# Patient Record
Sex: Female | Born: 1945 | Race: White | Hispanic: No | Marital: Single | Smoking: Former smoker
Health system: Southern US, Community
[De-identification: ages and names within clinical notes are randomized; demographics above are authoritative.]

## PROBLEM LIST (undated history)

## (undated) DIAGNOSIS — K649 Unspecified hemorrhoids: Secondary | ICD-10-CM

## (undated) DIAGNOSIS — I10 Essential (primary) hypertension: Secondary | ICD-10-CM

## (undated) DIAGNOSIS — I809 Phlebitis and thrombophlebitis of unspecified site: Secondary | ICD-10-CM

## (undated) DIAGNOSIS — E042 Nontoxic multinodular goiter: Secondary | ICD-10-CM

## (undated) DIAGNOSIS — M1990 Unspecified osteoarthritis, unspecified site: Secondary | ICD-10-CM

## (undated) DIAGNOSIS — M5030 Other cervical disc degeneration, unspecified cervical region: Secondary | ICD-10-CM

## (undated) DIAGNOSIS — H3320 Serous retinal detachment, unspecified eye: Secondary | ICD-10-CM

## (undated) DIAGNOSIS — G4700 Insomnia, unspecified: Secondary | ICD-10-CM

## (undated) HISTORY — DX: Unspecified osteoarthritis, unspecified site: M19.90

## (undated) HISTORY — PX: ABDOMINAL HYSTERECTOMY: SHX81

## (undated) HISTORY — DX: Insomnia, unspecified: G47.00

## (undated) HISTORY — PX: EYE SURGERY: SHX253

## (undated) HISTORY — PX: SPINE SURGERY: SHX786

## (undated) HISTORY — DX: Essential (primary) hypertension: I10

## (undated) HISTORY — DX: Phlebitis and thrombophlebitis of unspecified site: I80.9

## (undated) HISTORY — DX: Unspecified hemorrhoids: K64.9

## (undated) HISTORY — DX: Nontoxic multinodular goiter: E04.2

## (undated) HISTORY — DX: Other cervical disc degeneration, unspecified cervical region: M50.30

## (undated) HISTORY — PX: COLONOSCOPY: SHX174

## (undated) HISTORY — PX: TUBAL LIGATION: SHX77

---

## 2005-09-04 LAB — HM COLONOSCOPY: HM Colonoscopy: NORMAL

## 2007-01-30 ENCOUNTER — Encounter: Admission: RE | Admit: 2007-01-30 | Discharge: 2007-01-30 | Payer: Self-pay | Admitting: Family Medicine

## 2007-02-01 ENCOUNTER — Encounter: Admission: RE | Admit: 2007-02-01 | Discharge: 2007-02-01 | Payer: Self-pay | Admitting: Family Medicine

## 2008-02-03 ENCOUNTER — Encounter: Admission: RE | Admit: 2008-02-03 | Discharge: 2008-02-03 | Payer: Self-pay | Admitting: Family Medicine

## 2009-04-15 ENCOUNTER — Encounter: Admission: RE | Admit: 2009-04-15 | Discharge: 2009-04-15 | Payer: Self-pay | Admitting: Family Medicine

## 2009-07-09 LAB — HM PAP SMEAR: HM Pap smear: NORMAL

## 2010-03-12 ENCOUNTER — Other Ambulatory Visit: Payer: Self-pay | Admitting: Family Medicine

## 2010-03-12 DIAGNOSIS — Z1239 Encounter for other screening for malignant neoplasm of breast: Secondary | ICD-10-CM

## 2010-03-12 DIAGNOSIS — Z780 Asymptomatic menopausal state: Secondary | ICD-10-CM

## 2010-04-18 ENCOUNTER — Ambulatory Visit
Admission: RE | Admit: 2010-04-18 | Discharge: 2010-04-18 | Disposition: A | Discharge status: Home / Self Care | Payer: Private Health Insurance - Indemnity | Source: Ambulatory Visit | Attending: Family Medicine | Admitting: Family Medicine

## 2010-04-18 DIAGNOSIS — Z1239 Encounter for other screening for malignant neoplasm of breast: Secondary | ICD-10-CM

## 2010-04-18 DIAGNOSIS — Z780 Asymptomatic menopausal state: Secondary | ICD-10-CM

## 2010-04-18 LAB — HM DEXA SCAN

## 2010-04-21 ENCOUNTER — Other Ambulatory Visit: Payer: Self-pay | Admitting: Family Medicine

## 2010-04-21 DIAGNOSIS — R928 Other abnormal and inconclusive findings on diagnostic imaging of breast: Secondary | ICD-10-CM

## 2010-04-27 ENCOUNTER — Other Ambulatory Visit: Payer: Private Health Insurance - Indemnity

## 2010-04-28 ENCOUNTER — Ambulatory Visit
Admission: RE | Admit: 2010-04-28 | Discharge: 2010-04-28 | Disposition: A | Discharge status: Home / Self Care | Payer: Medicare Other | Source: Ambulatory Visit | Attending: Family Medicine | Admitting: Family Medicine

## 2010-04-28 DIAGNOSIS — R928 Other abnormal and inconclusive findings on diagnostic imaging of breast: Secondary | ICD-10-CM

## 2010-12-14 ENCOUNTER — Encounter: Payer: Self-pay | Admitting: Gastroenterology

## 2011-01-16 ENCOUNTER — Other Ambulatory Visit: Payer: Private Health Insurance - Indemnity | Admitting: Gastroenterology

## 2011-01-27 ENCOUNTER — Telehealth: Payer: Self-pay | Admitting: *Deleted

## 2011-01-27 NOTE — Telephone Encounter (Signed)
Dr. Arlyce Dice; pt here for [facility] today for screening colonoscopy.  Last colonoscopy in [place] 5 years ago.  She does not remember MD name or even where she had it performed so we have no way of getting reports.  Pt says colonoscopy was normal with no polyps.  No family history of colon cancer. She is not having problems. She made this appointment for colonoscopy because she thought she should have a colonoscopy every 5 years.   I told her that I would talk to you but I thought she would be due for colonoscopy 10 years from last colonoscopy.  If you agree, I will enter recall for 2017.  Thanks, Ezra Sites

## 2011-01-30 NOTE — Telephone Encounter (Signed)
Recall for 2017 entered into computer. Audrey David

## 2011-01-30 NOTE — Telephone Encounter (Signed)
I agree. She does not need another exam for 5 years.

## 2011-02-06 ENCOUNTER — Other Ambulatory Visit: Payer: Private Health Insurance - Indemnity | Admitting: Gastroenterology

## 2011-05-12 ENCOUNTER — Other Ambulatory Visit: Payer: Self-pay | Admitting: Family Medicine

## 2011-05-12 DIAGNOSIS — Z1231 Encounter for screening mammogram for malignant neoplasm of breast: Secondary | ICD-10-CM

## 2011-05-30 ENCOUNTER — Ambulatory Visit (HOSPITAL_COMMUNITY)
Admission: RE | Admit: 2011-05-30 | Discharge: 2011-05-30 | Disposition: A | Discharge status: Home / Self Care | Payer: Medicare Other | Source: Ambulatory Visit | Attending: Chiropractic Medicine | Admitting: Chiropractic Medicine

## 2011-05-30 ENCOUNTER — Other Ambulatory Visit (HOSPITAL_COMMUNITY): Payer: Self-pay | Admitting: Chiropractic Medicine

## 2011-05-30 DIAGNOSIS — M545 Low back pain, unspecified: Secondary | ICD-10-CM | POA: Insufficient documentation

## 2011-05-30 DIAGNOSIS — R52 Pain, unspecified: Secondary | ICD-10-CM

## 2011-05-30 DIAGNOSIS — M4310 Spondylolisthesis, site unspecified: Secondary | ICD-10-CM | POA: Diagnosis not present

## 2011-05-30 DIAGNOSIS — M542 Cervicalgia: Secondary | ICD-10-CM | POA: Diagnosis not present

## 2011-05-30 DIAGNOSIS — M47817 Spondylosis without myelopathy or radiculopathy, lumbosacral region: Secondary | ICD-10-CM | POA: Insufficient documentation

## 2011-05-30 DIAGNOSIS — M5137 Other intervertebral disc degeneration, lumbosacral region: Secondary | ICD-10-CM | POA: Insufficient documentation

## 2011-05-30 DIAGNOSIS — M9981 Other biomechanical lesions of cervical region: Secondary | ICD-10-CM | POA: Diagnosis not present

## 2011-05-30 DIAGNOSIS — M5030 Other cervical disc degeneration, unspecified cervical region: Secondary | ICD-10-CM | POA: Diagnosis not present

## 2011-05-30 DIAGNOSIS — M79609 Pain in unspecified limb: Secondary | ICD-10-CM | POA: Diagnosis not present

## 2011-05-30 DIAGNOSIS — M51379 Other intervertebral disc degeneration, lumbosacral region without mention of lumbar back pain or lower extremity pain: Secondary | ICD-10-CM | POA: Diagnosis not present

## 2011-05-30 DIAGNOSIS — M47812 Spondylosis without myelopathy or radiculopathy, cervical region: Secondary | ICD-10-CM | POA: Insufficient documentation

## 2011-05-30 DIAGNOSIS — M999 Biomechanical lesion, unspecified: Secondary | ICD-10-CM | POA: Diagnosis not present

## 2011-06-01 DIAGNOSIS — R7989 Other specified abnormal findings of blood chemistry: Secondary | ICD-10-CM | POA: Diagnosis not present

## 2011-06-01 DIAGNOSIS — M9981 Other biomechanical lesions of cervical region: Secondary | ICD-10-CM | POA: Diagnosis not present

## 2011-06-01 DIAGNOSIS — M4310 Spondylolisthesis, site unspecified: Secondary | ICD-10-CM | POA: Diagnosis not present

## 2011-06-01 DIAGNOSIS — Z0000 Encounter for general adult medical examination without abnormal findings: Secondary | ICD-10-CM | POA: Diagnosis not present

## 2011-06-01 DIAGNOSIS — M818 Other osteoporosis without current pathological fracture: Secondary | ICD-10-CM | POA: Diagnosis not present

## 2011-06-01 DIAGNOSIS — M545 Low back pain, unspecified: Secondary | ICD-10-CM | POA: Diagnosis not present

## 2011-06-01 DIAGNOSIS — E785 Hyperlipidemia, unspecified: Secondary | ICD-10-CM | POA: Diagnosis not present

## 2011-06-01 DIAGNOSIS — M999 Biomechanical lesion, unspecified: Secondary | ICD-10-CM | POA: Diagnosis not present

## 2011-06-05 DIAGNOSIS — R55 Syncope and collapse: Secondary | ICD-10-CM | POA: Diagnosis not present

## 2011-06-05 DIAGNOSIS — M545 Low back pain, unspecified: Secondary | ICD-10-CM | POA: Diagnosis not present

## 2011-06-05 DIAGNOSIS — M999 Biomechanical lesion, unspecified: Secondary | ICD-10-CM | POA: Diagnosis not present

## 2011-06-05 DIAGNOSIS — M9981 Other biomechanical lesions of cervical region: Secondary | ICD-10-CM | POA: Diagnosis not present

## 2011-06-05 DIAGNOSIS — R7309 Other abnormal glucose: Secondary | ICD-10-CM | POA: Diagnosis not present

## 2011-06-05 DIAGNOSIS — M4310 Spondylolisthesis, site unspecified: Secondary | ICD-10-CM | POA: Diagnosis not present

## 2011-06-09 ENCOUNTER — Ambulatory Visit
Admission: RE | Admit: 2011-06-09 | Discharge: 2011-06-09 | Disposition: A | Discharge status: Home / Self Care | Payer: Medicare Other | Source: Ambulatory Visit | Attending: Family Medicine | Admitting: Family Medicine

## 2011-06-09 DIAGNOSIS — Z1231 Encounter for screening mammogram for malignant neoplasm of breast: Secondary | ICD-10-CM | POA: Diagnosis not present

## 2011-07-06 ENCOUNTER — Encounter: Payer: Self-pay | Admitting: Gastroenterology

## 2011-07-07 DIAGNOSIS — R42 Dizziness and giddiness: Secondary | ICD-10-CM | POA: Diagnosis not present

## 2011-07-07 DIAGNOSIS — E7800 Pure hypercholesterolemia, unspecified: Secondary | ICD-10-CM | POA: Diagnosis not present

## 2011-07-07 DIAGNOSIS — R9431 Abnormal electrocardiogram [ECG] [EKG]: Secondary | ICD-10-CM | POA: Diagnosis not present

## 2011-07-07 DIAGNOSIS — R030 Elevated blood-pressure reading, without diagnosis of hypertension: Secondary | ICD-10-CM | POA: Diagnosis not present

## 2011-07-10 DIAGNOSIS — R222 Localized swelling, mass and lump, trunk: Secondary | ICD-10-CM | POA: Diagnosis not present

## 2011-07-10 DIAGNOSIS — K6289 Other specified diseases of anus and rectum: Secondary | ICD-10-CM | POA: Diagnosis not present

## 2011-08-08 ENCOUNTER — Encounter: Payer: Self-pay | Admitting: Gastroenterology

## 2011-08-08 ENCOUNTER — Ambulatory Visit (INDEPENDENT_AMBULATORY_CARE_PROVIDER_SITE_OTHER): Payer: Medicare Other | Admitting: Gastroenterology

## 2011-08-08 VITALS — BP 132/60 | HR 88 | Ht 65.0 in | Wt 149.0 lb

## 2011-08-08 DIAGNOSIS — Z1211 Encounter for screening for malignant neoplasm of colon: Secondary | ICD-10-CM | POA: Insufficient documentation

## 2011-08-08 DIAGNOSIS — K648 Other hemorrhoids: Secondary | ICD-10-CM | POA: Diagnosis not present

## 2011-08-08 NOTE — Assessment & Plan Note (Signed)
Last colonoscopy approximately 2008. Plan followup colonoscopy 2018

## 2011-08-08 NOTE — Progress Notes (Signed)
History of Present Illness: [name] is a 66 year old white female referred at the request of Dr. Tanya Nones her evaluation of a rectal abnormality. While washing herself she felt a pea-sized lump in her rectum. It is painless. She's had no change in bowel habits, rectal pain or bleeding. Colonoscopy 5 years ago in [place] apparently was normal.    Past Medical History  Diagnosis Date  . Phlebitis   . Arthritis   . Hemorrhoids    Past Surgical History  Procedure Date  . Abdominal hysterectomy    family history is not on file. Current Outpatient Prescriptions  Medication Sig Dispense Refill  . meloxicam (MOBIC) 15 MG tablet Use as directed      . zolpidem (AMBIEN) 10 MG tablet Use as bedtime as directed       Allergies as of 08/08/2011  . (No Known Allergies)    reports that she has quit smoking. She has never used smokeless tobacco. She reports that she drinks alcohol. She reports that she does not use illicit drugs.     Review of Systems: Pertinent positive and negative review of systems were noted in the above HPI section. All other review of systems were otherwise negative.  Vital signs were reviewed in today's medical record Physical Exam: General: Well developed , well nourished, no acute distress Head: Normocephalic and atraumatic Eyes:  sclerae anicteric, EOMI Ears: Normal auditory acuity Mouth: No deformity or lesions Neck: Supple, no masses or thyromegaly Lungs: Clear throughout to auscultation Heart: Regular rate and rhythm; no murmurs, rubs or bruits Abdomen: Soft, non tender and non distended. No masses, hepatosplenomegaly or hernias noted. Normal Bowel sounds Rectal: There are no rectal masses. A small external hemorrhoid is present. Musculoskeletal: Symmetrical with no gross deformities  Skin: No lesions on visible extremities Pulses:  Normal pulses noted Extremities: No clubbing, cyanosis, edema or deformities noted Neurological: Alert oriented x 4,  grossly nonfocal Cervical Nodes:  No significant cervical adenopathy Inguinal Nodes: No significant inguinal adenopathy Psychological:  Alert and cooperative. Normal mood and affect  Anoscopy was performed. Internal hemorrhoids were noted. On the left side an internal hemorrhoid corresponded to what the patient was palpating.

## 2011-08-08 NOTE — Assessment & Plan Note (Signed)
The patient is palpating a small internal hemorrhoid. She is asymptomatic. Accordingly, I don't think therapy is required.

## 2011-08-08 NOTE — Patient Instructions (Addendum)
Follow up with Dr. Arlyce Dice as needed   Hemorrhoids Hemorrhoids are enlarged (dilated) veins around the rectum. There are 2 types of hemorrhoids, and the type of hemorrhoid is determined by its location. Internal hemorrhoids occur in the veins just inside the rectum.They are usually not painful, but they may bleed.However, they may poke through to the outside and become irritated and painful. External hemorrhoids involve the veins outside the anus and can be felt as a painful swelling or hard lump near the anus.They are often itchy and may crack and bleed. Sometimes clots will form in the veins. This makes them swollen and painful. These are called thrombosed hemorrhoids. CAUSES Causes of hemorrhoids include:  Pregnancy. This increases the pressure in the hemorrhoidal veins.   Constipation.   Straining to have a bowel movement.   Obesity.   Heavy lifting or other activity that caused you to strain.  TREATMENT Most of the time hemorrhoids improve in 1 to 2 weeks. However, if symptoms do not seem to be getting better or if you have a lot of rectal bleeding, your caregiver may perform a procedure to help make the hemorrhoids get smaller or remove them completely.Possible treatments include:  Rubber band ligation. A rubber band is placed at the base of the hemorrhoid to cut off the circulation.   Sclerotherapy. A chemical is injected to shrink the hemorrhoid.   Infrared light therapy. Tools are used to burn the hemorrhoid.   Hemorrhoidectomy. This is surgical removal of the hemorrhoid.  HOME CARE INSTRUCTIONS   Increase fiber in your diet. Ask your caregiver about using fiber supplements.   Drink enough water and fluids to keep your urine clear or pale yellow.   Exercise regularly.   Go to the bathroom when you have the urge to have a bowel movement. Do not wait.   Avoid straining to have bowel movements.   Keep the anal area dry and clean.   Only take over-the-counter or  prescription medicines for pain, discomfort, or fever as directed by your caregiver.  If your hemorrhoids are thrombosed:  Take warm sitz baths for 20 to 30 minutes, 3 to 4 times per day.   If the hemorrhoids are very tender and swollen, place ice packs on the area as tolerated. Using ice packs between sitz baths may be helpful. Fill a plastic bag with ice. Place a towel between the bag of ice and your skin.   Medicated creams and suppositories may be used or applied as directed.   Do not use a donut-shaped pillow or sit on the toilet for long periods. This increases blood pooling and pain.  SEEK MEDICAL CARE IF:   You have increasing pain and swelling that is not controlled with your medicine.   You have uncontrolled bleeding.   You have difficulty or you are unable to have a bowel movement.   You have pain or inflammation outside the area of the hemorrhoids.   You have chills or an oral temperature above 102 F (38.9 C).  MAKE SURE YOU:   Understand these instructions.   Will watch your condition.   Will get help right away if you are not doing well or get worse.  Document Released: 02/04/2000 Document Revised: 01/26/2011 Document Reviewed: 06/11/2007 [facility] Patient Information 2012 [place], [place].

## 2011-08-10 DIAGNOSIS — R9431 Abnormal electrocardiogram [ECG] [EKG]: Secondary | ICD-10-CM | POA: Diagnosis not present

## 2011-11-02 DIAGNOSIS — S30860A Insect bite (nonvenomous) of lower back and pelvis, initial encounter: Secondary | ICD-10-CM | POA: Diagnosis not present

## 2011-11-02 DIAGNOSIS — L0390 Cellulitis, unspecified: Secondary | ICD-10-CM | POA: Diagnosis not present

## 2011-11-02 DIAGNOSIS — L0291 Cutaneous abscess, unspecified: Secondary | ICD-10-CM | POA: Diagnosis not present

## 2011-11-02 DIAGNOSIS — T7840XA Allergy, unspecified, initial encounter: Secondary | ICD-10-CM | POA: Diagnosis not present

## 2011-11-30 DIAGNOSIS — I10 Essential (primary) hypertension: Secondary | ICD-10-CM | POA: Diagnosis not present

## 2012-01-24 DIAGNOSIS — E119 Type 2 diabetes mellitus without complications: Secondary | ICD-10-CM | POA: Diagnosis not present

## 2012-01-24 DIAGNOSIS — H2510 Age-related nuclear cataract, unspecified eye: Secondary | ICD-10-CM | POA: Diagnosis not present

## 2012-04-30 ENCOUNTER — Other Ambulatory Visit: Payer: Self-pay

## 2012-04-30 DIAGNOSIS — Z1231 Encounter for screening mammogram for malignant neoplasm of breast: Secondary | ICD-10-CM

## 2012-07-03 ENCOUNTER — Ambulatory Visit
Admission: RE | Admit: 2012-07-03 | Discharge: 2012-07-03 | Disposition: A | Discharge status: Home / Self Care | Payer: Medicare Other | Source: Ambulatory Visit

## 2012-07-03 DIAGNOSIS — Z1231 Encounter for screening mammogram for malignant neoplasm of breast: Secondary | ICD-10-CM | POA: Diagnosis not present

## 2012-07-20 ENCOUNTER — Other Ambulatory Visit: Payer: Self-pay | Admitting: Physician Assistant

## 2012-07-22 NOTE — Telephone Encounter (Signed)
Medication refilled per protocol.Patient needs to be seen before any further refills  Pt called and appt made

## 2012-08-08 ENCOUNTER — Ambulatory Visit (INDEPENDENT_AMBULATORY_CARE_PROVIDER_SITE_OTHER): Payer: Medicare Other | Admitting: Physician Assistant

## 2012-08-08 ENCOUNTER — Encounter: Payer: Self-pay | Admitting: Physician Assistant

## 2012-08-08 VITALS — BP 104/70 | HR 68 | Temp 98.4°F | Resp 20 | Ht 62.5 in | Wt 147.0 lb

## 2012-08-08 DIAGNOSIS — IMO0002 Reserved for concepts with insufficient information to code with codable children: Secondary | ICD-10-CM | POA: Diagnosis not present

## 2012-08-08 DIAGNOSIS — R252 Cramp and spasm: Secondary | ICD-10-CM | POA: Diagnosis not present

## 2012-08-08 DIAGNOSIS — I10 Essential (primary) hypertension: Secondary | ICD-10-CM | POA: Insufficient documentation

## 2012-08-08 DIAGNOSIS — M1711 Unilateral primary osteoarthritis, right knee: Secondary | ICD-10-CM

## 2012-08-08 DIAGNOSIS — M1710 Unilateral primary osteoarthritis, unspecified knee: Secondary | ICD-10-CM

## 2012-08-08 DIAGNOSIS — G4700 Insomnia, unspecified: Secondary | ICD-10-CM | POA: Diagnosis not present

## 2012-08-08 MED ORDER — ZOLPIDEM TARTRATE 10 MG PO TABS: 10.0000 mg | ORAL_TABLET | Freq: Every evening | ORAL | Status: DC | PRN

## 2012-08-08 NOTE — Progress Notes (Signed)
.  mbd

## 2012-08-08 NOTE — Progress Notes (Signed)
Patient ID: Artice Holohan MRN: [id number], DOB: 1946-02-05, 67 y.o. Date of Encounter: @DATE @  Chief Complaint:  Chief Complaint  Patient presents with  . routine check up    c/o back pain, knee pain, leg cramps    HPI: 67 y.o. year old white female  presents for routine f/u OV.   1-HTN: She takes Lisinopril 10mg  one QHS-takes at night. Has no adv effects. 2-Insomnia: Takes Ambien 10mg  every night-o/w "cannot get my mind to shut off"-thinking about her mom (dementia), problems with her daughter, grandchildern, etc. (Her daughter still willnot let her see her grandchildren). She has a son still in [place] that has been in prison.  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 (with food)  Has been having muscle cramps-sometimes in hands, soemtimes in lower legs.  Never took Celexa. "Feels like it is a cop out." Feels like she just has to deal with these problems-mom with dementia, daughter willnot let her talk to/see grandkids.     Past Medical History  Diagnosis Date  . Phlebitis   . Arthritis   . Hemorrhoids   . Hypertension   . Insomnia      Home Meds: See attached medication section for current medication list. Any medications entered into computer today will not appear on this note's list. The medications listed below were entered prior to today. Current Outpatient Prescriptions on File Prior to Visit  Medication Sig Dispense Refill  . lisinopril (PRINIVIL,ZESTRIL) 10 MG tablet TAKE 1 TABLET DAILY  90 tablet  0  . meloxicam (MOBIC) 15 MG tablet Use as directed       No current facility-administered medications on file prior to visit.    Allergies: No Known Allergies  History   Social History  . Marital Status: Single    Spouse Name: N/A    Number of Children: N/A  . Years of Education: N/A   Occupational History  . Not on file.   Social History Main Topics  . Smoking status: Former Games developer  . Smokeless tobacco: Never  Used  . Alcohol Use: Yes     Comment: Social   . Drug Use: No  . Sexually Active: Not on file   Other Topics Concern  . Not on file   Social History Narrative   Daily caffeine     No family history on file.   Review of Systems:  See HPI for pertinent ROS. All other ROS negative.    Physical Exam: Blood pressure 104/70, pulse 68, temperature 98.4 F (36.9 C), temperature source Oral, resp. rate 20, height 5' 2.5" (1.588 m), weight 147 lb (66.679 kg)., Body mass index is 26.44 kg/(m^2). General: WNWD WF. Appears in no acute distress. Neck: Supple. No thyromegaly. No lymphadenopathy. No carotid bruits.  Lungs: Clear bilaterally to auscultation without wheezes, rales, or rhonchi. Breathing is unlabored. Heart: RRR with S1 S2. No murmurs, rubs, or gallops. Musculoskeletal:  Strength and tone normal for age. Extremities/Skin: Warm and dry.  No edema. No rashes or suspicious lesions. Neuro: Alert and oriented X 3. Moves all extremities spontaneously. Gait is normal. CNII-XII grossly in tact. Psych:  Responds to questions appropriately with a normal affect.     ASSESSMENT AND PLAN:  67 y.o. year old female with  1. Hypertension At goal. Cont Lisinopril 10mg  QHS. Check CMET.  2. Insomnia - zolpidem (AMBIEN) 10 MG tablet; Take 1 tablet (10 mg total) by mouth at bedtime as needed for  sleep. Use as bedtime as directed  Dispense: 30 tablet; Refill: 2 She is aware these can be habit forming. Needs to try to decrease use if possible.   3. Osteoarthritis of right knee Cont Meloxicam with food daily.  4. Muscle cramps Check labs to assess for underlying causes. If these are normal, recommend stretching.  - CBC with Differential - COMPLETE METABOLIC PANEL WITH GFR - Magnesium - TSH  5. Screening Mammogram: per pt: 07/08/2012-negative.  6. Screening Labs: 05/2011-Had CBC, CMET, TSH, Vit D 7. DEXA: 04/18/2010-Normal. On Ca, Vit D, Exercise 8. Zostavax: Given 06/2010 9. Colonoscopy:  2007-in [place]-Repeat 5 years-I scheduled her to f/u with GI at CPE 11/2010-I will need to check tos see if she had this.    Murray Hodgkins [place], [place], [facility] 08/08/2012 3:22 PM

## 2012-08-09 ENCOUNTER — Telehealth: Payer: Self-pay | Admitting: Family Medicine

## 2012-08-09 LAB — CBC WITH DIFFERENTIAL/PLATELET
Basophils Absolute: 0 10*3/uL (ref 0.0–0.1)
Basophils Relative: 1 % (ref 0–1)
Eosinophils Absolute: 0.3 10*3/uL (ref 0.0–0.7)
Eosinophils Relative: 4 % (ref 0–5)
HCT: 39.3 % (ref 36.0–46.0)
Hemoglobin: 12.9 g/dL (ref 12.0–15.0)
Lymphocytes Relative: 30 % (ref 12–46)
Lymphs Abs: 2.1 10*3/uL (ref 0.7–4.0)
MCH: 26.7 pg (ref 26.0–34.0)
MCHC: 32.8 g/dL (ref 30.0–36.0)
MCV: 81.4 fL (ref 78.0–100.0)
Monocytes Absolute: 0.6 10*3/uL (ref 0.1–1.0)
Monocytes Relative: 9 % (ref 3–12)
Neutro Abs: 3.8 10*3/uL (ref 1.7–7.7)
Neutrophils Relative %: 56 % (ref 43–77)
Platelets: 222 10*3/uL (ref 150–400)
RBC: 4.83 MIL/uL (ref 3.87–5.11)
RDW: 14.2 % (ref 11.5–15.5)
WBC: 6.8 10*3/uL (ref 4.0–10.5)

## 2012-08-09 LAB — COMPLETE METABOLIC PANEL WITH GFR
ALT: 16 U/L (ref 0–35)
AST: 18 U/L (ref 0–37)
Albumin: 4.3 g/dL (ref 3.5–5.2)
Alkaline Phosphatase: 77 U/L (ref 39–117)
BUN: 23 mg/dL (ref 6–23)
CO2: 25 mEq/L (ref 19–32)
Calcium: 9.6 mg/dL (ref 8.4–10.5)
Chloride: 105 mEq/L (ref 96–112)
Creat: 0.92 mg/dL (ref 0.50–1.10)
GFR, Est African American: 74 mL/min
GFR, Est Non African American: 64 mL/min
Glucose, Bld: 101 mg/dL — ABNORMAL HIGH (ref 70–99)
Potassium: 4.4 mEq/L (ref 3.5–5.3)
Sodium: 140 mEq/L (ref 135–145)
Total Bilirubin: 0.4 mg/dL (ref 0.3–1.2)
Total Protein: 6.9 g/dL (ref 6.0–8.3)

## 2012-08-09 LAB — MAGNESIUM: Magnesium: 2 mg/dL (ref 1.5–2.5)

## 2012-08-09 LAB — TSH: TSH: 1.448 u[IU]/mL (ref 0.350–4.500)

## 2012-08-09 MED ORDER — TIZANIDINE HCL 2 MG PO TABS: 2.0000 mg | ORAL_TABLET | Freq: Four times a day (QID) | ORAL | Status: DC | PRN

## 2012-08-09 MED ORDER — LISINOPRIL 10 MG PO TABS: 10.0000 mg | ORAL_TABLET | Freq: Every day | ORAL | Status: DC

## 2012-08-09 NOTE — Telephone Encounter (Signed)
Message copied by Donne Anon on Fri Aug 09, 2012  9:45 AM ------      Message from: Allayne Butcher      Created: Fri Aug 09, 2012  7:19 AM       At OV pt c/o cramps in hands and legs. Tell her all labs normal.       CBC-no anemia.       CMET-Potassium, Calcium normal. Do NOT take supplements of these.      Magnesium-nml      TSH-thyroid normal.      Tell her to use a muscle relaxer. Send RX for Skelaxin 800mg  one po QID prn. # 60/ 2 refills ------

## 2012-08-09 NOTE — Telephone Encounter (Signed)
Received phone call from provider, MB Dixon PAC, she gives me verbal order for Tizanidine 2mg  every 6 hrs prn muscle spasms.  rx sent to [facility]

## 2012-08-09 NOTE — Telephone Encounter (Signed)
Pt called.  Made aware of lab results and new rx for muscle relaxer

## 2012-08-09 NOTE — Telephone Encounter (Signed)
Insurance does not cover Skelaxin.  They will cover Tizanidine, Baclofen, Lioresal, cyclobenzaprine.  Can you change RX??

## 2012-08-12 ENCOUNTER — Telehealth: Payer: Self-pay | Admitting: Family Medicine

## 2012-08-12 ENCOUNTER — Telehealth: Payer: Self-pay | Admitting: Physician Assistant

## 2012-08-12 NOTE — Telephone Encounter (Signed)
Already called into [facility]

## 2012-08-12 NOTE — Telephone Encounter (Signed)
RX done 6/20???  Called in again

## 2012-09-25 ENCOUNTER — Encounter: Payer: Self-pay | Admitting: Physician Assistant

## 2012-09-25 ENCOUNTER — Ambulatory Visit (INDEPENDENT_AMBULATORY_CARE_PROVIDER_SITE_OTHER): Payer: Medicare Other | Admitting: Physician Assistant

## 2012-09-25 VITALS — BP 104/64 | HR 80 | Temp 98.5°F | Resp 18 | Wt 151.0 lb

## 2012-09-25 DIAGNOSIS — L239 Allergic contact dermatitis, unspecified cause: Secondary | ICD-10-CM

## 2012-09-25 DIAGNOSIS — L259 Unspecified contact dermatitis, unspecified cause: Secondary | ICD-10-CM

## 2012-09-25 MED ORDER — METHYLPREDNISOLONE ACETATE 80 MG/ML IJ SUSP
80.0000 mg | Freq: Once | INTRAMUSCULAR | Status: AC
  Administered 2012-09-25: 80 mg via INTRAMUSCULAR

## 2012-09-25 NOTE — Progress Notes (Signed)
   Patient ID: Audrey David MRN: [id number], DOB: February 26, 1945, 67 y.o. Date of Encounter: 09/25/2012, 10:59 AM    Chief Complaint:  Chief Complaint  Patient presents with  . poison on arms/legs    had dose pack at home so took it     HPI: 67 y.o. year old white female here with c/o poison oak. Says it originally started 8 days ago. She then started a Prednisone Pack (that she had from something in past but hadnot taken-says it was not expired). She took entire pack-last one yesterday.  However, 3 days ago, all the sudden, her face "was terrible"-with rash, itching. She had done more yardwork earlier that day. Says she was pulling weeds and must have rubbed her face with her hand when she was trying to get her hair out of her face. She started taking oral Benadryl and applying Benadryl cream. Also was on the prednisone taper. However, has new areas of rash on arms, thighs.   Home Meds: See attached medication section for any medications that were entered at today's visit. The computer does not put those onto this list.The following list is a list of meds entered prior to today's visit.   Current Outpatient Prescriptions on File Prior to Visit  Medication Sig Dispense Refill  . lisinopril (PRINIVIL,ZESTRIL) 10 MG tablet Take 1 tablet (10 mg total) by mouth daily.  90 tablet  3  . meloxicam (MOBIC) 15 MG tablet Use as directed      . tiZANidine (ZANAFLEX) 2 MG tablet Take 1 tablet (2 mg total) by mouth every 6 (six) hours as needed.  30 tablet  0  . zolpidem (AMBIEN) 10 MG tablet Take 1 tablet (10 mg total) by mouth at bedtime as needed for sleep. Use as bedtime as directed  30 tablet  2   No current facility-administered medications on file prior to visit.    Allergies: No Known Allergies    Review of Systems: See HPI for pertinent ROS. All other ROS negative.    Physical Exam: Blood pressure 104/64, pulse 80, temperature 98.5 F (36.9 C), temperature source Oral, resp. rate 18,  weight 151 lb (68.493 kg)., Body mass index is 27.16 kg/(m^2). General: WNWD WF.  Appears in no acute distress. Lungs: Clear bilaterally to auscultation without wheezes, rales, or rhonchi. Breathing is unlabored. Heart: Regular rhythm. No murmurs, rubs, or gallops. Msk:  Strength and tone normal for age. Skin: Bilateral thighs with erytematous [papules. Bilateral forearms with vessicles, papules. Fresh vessicles on hands.  Neuro: Alert and oriented X 3. Moves all extremities spontaneously. Gait is normal. CNII-XII grossly in tact. Psych:  Responds to questions appropriately with a normal affect.     ASSESSMENT AND PLAN:  67 y.o. year old female with  1. Allergic dermatitis/Rheus Dermatitis. Pt says she doesnot know what poison oak/ivy look like. She is to identify these and avoid contact with them to avoid repeat exposure.  F/U if this does not resolve. - methylPREDNISolone acetate (DEPO-MEDROL) injection 80 mg; Inject 1 mL (80 mg total) into the muscle once.   Signed, [address] [place], [place], [facility] 09/25/2012 10:59 AM

## 2012-09-26 ENCOUNTER — Encounter: Payer: Self-pay | Admitting: Physician Assistant

## 2012-09-26 ENCOUNTER — Ambulatory Visit (INDEPENDENT_AMBULATORY_CARE_PROVIDER_SITE_OTHER): Payer: Medicare Other | Admitting: Physician Assistant

## 2012-09-26 VITALS — BP 104/70 | HR 88 | Temp 98.3°F | Resp 18 | Wt 151.0 lb

## 2012-09-26 DIAGNOSIS — L259 Unspecified contact dermatitis, unspecified cause: Secondary | ICD-10-CM | POA: Diagnosis not present

## 2012-09-26 DIAGNOSIS — L239 Allergic contact dermatitis, unspecified cause: Secondary | ICD-10-CM

## 2012-09-26 MED ORDER — METHYLPREDNISOLONE ACETATE 80 MG/ML IJ SUSP
80.0000 mg | Freq: Once | INTRAMUSCULAR | Status: AC
  Administered 2012-09-26: 80 mg via INTRAMUSCULAR

## 2012-09-26 MED ORDER — PREDNISONE 20 MG PO TABS: ORAL_TABLET | ORAL | Status: DC

## 2012-09-26 NOTE — Progress Notes (Signed)
   Patient ID: Audrey David MRN: [id number], DOB: 05/14/1945, 67 y.o. Date of Encounter: 09/26/2012, 11:22 AM    Chief Complaint:  Chief Complaint  Patient presents with  . poison worse from yesterday     HPI: 67 y.o. year old white female was just seen by me yesterday sec to poison oak/ivy. Was given DepoMedrol 80mg  IM. However, today, still developing new, fresh ares of rash that is extremely itchy. So, here for f/u.  No other compalints.   Home Meds: See attached medication section for any medications that were entered at today's visit. The computer does not put those onto this list.The following list is a list of meds entered prior to today's visit.   Current Outpatient Prescriptions on File Prior to Visit  Medication Sig Dispense Refill  . lisinopril (PRINIVIL,ZESTRIL) 10 MG tablet Take 1 tablet (10 mg total) by mouth daily.  90 tablet  3  . meloxicam (MOBIC) 15 MG tablet Use as directed      . tiZANidine (ZANAFLEX) 2 MG tablet Take 1 tablet (2 mg total) by mouth every 6 (six) hours as needed.  30 tablet  0  . zolpidem (AMBIEN) 10 MG tablet Take 1 tablet (10 mg total) by mouth at bedtime as needed for sleep. Use as bedtime as directed  30 tablet  2   No current facility-administered medications on file prior to visit.    Allergies: No Known Allergies    Review of Systems: See HPI for pertinent ROS. All other ROS negative.    Physical Exam: Blood pressure 104/70, pulse 88, temperature 98.3 F (36.8 C), temperature source Oral, resp. rate 18, weight 151 lb (68.493 kg)., Body mass index is 27.16 kg/(m^2). General: WNWD WF.  Appears in no acute distress. Lungs: Clear bilaterally to auscultation without wheezes, rales, or rhonchi. Breathing is unlabored. Heart: Regular rhythm. No murmurs, rubs, or gallops. Msk:  Strength and tone normal for age. Extremities/Skin: Warm and dry. Erythematous papules on bilateral thigh, calves, arms bilaterally. Vessicles on hands, fingers. She  has new lesions that were not present yesterday.  Neuro: Alert and oriented X 3. Moves all extremities spontaneously. Gait is normal. CNII-XII grossly in tact. Psych:  Responds to questions appropriately with a normal affect.     ASSESSMENT AND PLAN:  67 y.o. year old female with  1. Allergic dermatitis Give another DepoMedrol 80 mg IM now. Then start oral taper tomorrow. F/U (Again!!) if does not resolve.  - predniSONE (DELTASONE) 20 MG tablet; Take 3 daily for 2 days, then 2 daily for 2 days, then 1 daily for 2 days.  Dispense: 12 tablet; Refill: 0   Signed, [address] [place], [place], [facility] 09/26/2012 11:22 AM

## 2012-09-26 NOTE — Addendum Note (Signed)
Addended by: Donne Anon on: 09/26/2012 11:40 AM   Modules accepted: Orders

## 2012-11-01 DIAGNOSIS — H43819 Vitreous degeneration, unspecified eye: Secondary | ICD-10-CM | POA: Diagnosis not present

## 2012-11-01 DIAGNOSIS — H43399 Other vitreous opacities, unspecified eye: Secondary | ICD-10-CM | POA: Diagnosis not present

## 2012-11-01 DIAGNOSIS — E119 Type 2 diabetes mellitus without complications: Secondary | ICD-10-CM | POA: Diagnosis not present

## 2012-11-01 DIAGNOSIS — H3410 Central retinal artery occlusion, unspecified eye: Secondary | ICD-10-CM | POA: Diagnosis not present

## 2012-11-05 DIAGNOSIS — H548 Legal blindness, as defined in USA: Secondary | ICD-10-CM | POA: Diagnosis not present

## 2012-11-05 DIAGNOSIS — H02839 Dermatochalasis of unspecified eye, unspecified eyelid: Secondary | ICD-10-CM | POA: Diagnosis not present

## 2012-11-05 DIAGNOSIS — H04129 Dry eye syndrome of unspecified lacrimal gland: Secondary | ICD-10-CM | POA: Diagnosis not present

## 2012-11-07 ENCOUNTER — Telehealth: Payer: Self-pay | Admitting: Physician Assistant

## 2012-11-07 DIAGNOSIS — G4700 Insomnia, unspecified: Secondary | ICD-10-CM

## 2012-11-07 NOTE — Telephone Encounter (Signed)
Ok to refill 

## 2012-11-07 NOTE — Telephone Encounter (Signed)
Ambien 10 mg 1 QHS prn sleep #90  Pt is leaving for out of town for about a month on 11/20/12 and needs to get this before she leaves because she will run out before she gets back.

## 2012-11-08 ENCOUNTER — Other Ambulatory Visit: Payer: Self-pay | Admitting: Physician Assistant

## 2012-11-08 MED ORDER — ZOLPIDEM TARTRATE 10 MG PO TABS: 10.0000 mg | ORAL_TABLET | Freq: Every evening | ORAL | Status: DC | PRN

## 2012-11-08 NOTE — Telephone Encounter (Signed)
This is a controlled med. Cannot give 90.  Rx 30 plus one refill

## 2012-11-11 ENCOUNTER — Other Ambulatory Visit: Payer: Self-pay | Admitting: Physician Assistant

## 2012-11-11 DIAGNOSIS — G4700 Insomnia, unspecified: Secondary | ICD-10-CM

## 2012-11-11 MED ORDER — ZOLPIDEM TARTRATE 10 MG PO TABS: 10.0000 mg | ORAL_TABLET | Freq: Every evening | ORAL | Status: DC | PRN

## 2012-11-11 NOTE — Telephone Encounter (Signed)
Med [facility]illed [facility]

## 2012-11-18 ENCOUNTER — Encounter: Payer: Self-pay | Admitting: Physician Assistant

## 2012-11-18 ENCOUNTER — Ambulatory Visit (INDEPENDENT_AMBULATORY_CARE_PROVIDER_SITE_OTHER): Payer: Medicare Other | Admitting: Physician Assistant

## 2012-11-18 VITALS — BP 132/80 | HR 72 | Temp 98.0°F | Resp 18 | Wt 152.0 lb

## 2012-11-18 DIAGNOSIS — R51 Headache: Secondary | ICD-10-CM | POA: Diagnosis not present

## 2012-11-18 DIAGNOSIS — I10 Essential (primary) hypertension: Secondary | ICD-10-CM

## 2012-11-18 NOTE — Addendum Note (Signed)
Addended by: Donne Anon on: 11/18/2012 04:52 PM   Modules accepted: Orders

## 2012-11-18 NOTE — Progress Notes (Signed)
   Patient ID: Audrey David MRN: [id number], DOB: October 25, 1945, 67 y.o. Date of Encounter: 11/18/2012, 4:26 PM    Chief Complaint:  Chief Complaint  Patient presents with  . pounding headache top of head     HPI: 67 y.o. year old white female reports that she has been having any pounding headache at the top of her head for the past 68 weeks. States that she has no prior history of any type of headaches. No history of migraines. Reports that she has had no increased stress and no decrease in her sleep recently. Has noticed new neurologic changes. She went to sightsee her eye doctor, Dr. Dione Booze, to make sure that this headache was not related to her eyes. He found no abnormalities on eye exam. She says that some mornings she wakes up with a headache already present. Says he's been taking Aleve with no relief. Says that the pain is currently a 6/10 and is a pounding at the top of her head.     Home Meds: See attached medication section for any medications that were entered at today's visit. The computer does not put those onto this list.The following list is a list of meds entered prior to today's visit.   Current Outpatient Prescriptions on File Prior to Visit  Medication Sig Dispense Refill  . lisinopril (PRINIVIL,ZESTRIL) 10 MG tablet Take 1 tablet (10 mg total) by mouth daily.  90 tablet  3  . meloxicam (MOBIC) 15 MG tablet TAKE 1 TABLET DAILY AS NEEDED  90 tablet  2  . predniSONE (DELTASONE) 20 MG tablet Take 3 daily for 2 days, then 2 daily for 2 days, then 1 daily for 2 days.  12 tablet  0  . tiZANidine (ZANAFLEX) 2 MG tablet Take 1 tablet (2 mg total) by mouth every 6 (six) hours as needed.  30 tablet  0  . zolpidem (AMBIEN) 10 MG tablet Take 1 tablet (10 mg total) by mouth at bedtime as needed for sleep. Use as bedtime as directed  30 tablet  2   No current facility-administered medications on file prior to visit.    Allergies: No Known Allergies    Review of Systems: See HPI for  pertinent ROS. All other ROS negative.    Physical Exam: Blood pressure 132/80, pulse 72, temperature 98 F (36.7 C), resp. rate 18, weight 152 lb (68.947 kg)., Body mass index is 27.34 kg/(m^2). General: well nourished, well-developed white female. Appears in no acute distress. Neck: Supple. No thyromegaly. No lymphadenopathy. No carotid bruit. Lungs: Clear bilaterally to auscultation without wheezes, rales, or rhonchi. Breathing is unlabored. Heart: Regular rhythm. No murmurs, rubs, or gallops. Msk:  Strength and tone normal for age. Extremities/Skin: Warm and dry.  Neuro: Alert and oriented X 3. Moves all extremities spontaneously. Gait is normal. CNII-XII grossly in tact. 5/5 bilateral grip strength, bilateral upper extremity strength, bilateral lower extremity strength. Romberg is negative. Psych:  Responds to questions appropriately with a normal affect.     ASSESSMENT AND PLAN:  67 y.o. year old female with  1. Headache(784.0) Will obtain head CT to rule out pathology. - CT Head W Contrast; Future  2. Hypertension Well controlled.   Murray Hodgkins [place], [place], [facility] 11/18/2012 4:26 PM

## 2012-11-19 ENCOUNTER — Ambulatory Visit
Admission: RE | Admit: 2012-11-19 | Discharge: 2012-11-19 | Disposition: A | Discharge status: Home / Self Care | Payer: Medicare Other | Source: Ambulatory Visit | Attending: Physician Assistant | Admitting: Physician Assistant

## 2012-11-19 DIAGNOSIS — G9389 Other specified disorders of brain: Secondary | ICD-10-CM | POA: Diagnosis not present

## 2012-11-19 DIAGNOSIS — R51 Headache: Secondary | ICD-10-CM

## 2012-11-19 LAB — BASIC METABOLIC PANEL
BUN: 21 mg/dL (ref 6–23)
CO2: 30 mEq/L (ref 19–32)
Calcium: 10.1 mg/dL (ref 8.4–10.5)
Chloride: 106 mEq/L (ref 96–112)
Creat: 0.97 mg/dL (ref 0.50–1.10)
Glucose, Bld: 101 mg/dL — ABNORMAL HIGH (ref 70–99)
Potassium: 5.2 mEq/L (ref 3.5–5.3)
Sodium: 142 mEq/L (ref 135–145)

## 2012-11-19 MED ORDER — IOHEXOL 300 MG/ML  SOLN
75.0000 mL | Freq: Once | INTRAMUSCULAR | Status: AC | PRN
  Administered 2012-11-19: 75 mL via INTRAVENOUS

## 2013-01-20 ENCOUNTER — Telehealth: Payer: Self-pay | Admitting: Physician Assistant

## 2013-01-20 NOTE — Telephone Encounter (Signed)
Pt states that she was given Tizanidine 2 mg to try for her leg cramps and was told to call back if it worked and it did and she is needing a refill Pharmacy is [facility] back number is [phone]

## 2013-01-20 NOTE — Telephone Encounter (Signed)
OK refill?? 

## 2013-01-21 MED ORDER — TIZANIDINE HCL 2 MG PO TABS: 2.0000 mg | ORAL_TABLET | Freq: Four times a day (QID) | ORAL | Status: DC | PRN

## 2013-01-21 NOTE — Telephone Encounter (Signed)
Approved for tizanadine 2 mg 1 by mouth every 6 hour when necessary #60 with 2 additional refills

## 2013-01-21 NOTE — Telephone Encounter (Signed)
rx called in

## 2013-01-22 ENCOUNTER — Telehealth: Payer: Self-pay | Admitting: Physician Assistant

## 2013-01-22 NOTE — Telephone Encounter (Signed)
Pt is wanting to know if her prescription was called into [facility] or was it sent to [facility] Call back number is [phone]

## 2013-01-22 NOTE — Telephone Encounter (Signed)
It was called in yesterday.  Did she check with pharmacy to see if it was there??

## 2013-02-03 MED ORDER — ZOLPIDEM TARTRATE 10 MG PO TABS: 10.0000 mg | ORAL_TABLET | Freq: Every evening | ORAL | Status: DC | PRN

## 2013-02-03 NOTE — Telephone Encounter (Signed)
3 prescriptions printed. One to fill now, one to fill 03/06/13, one to fill 04/06/13.

## 2013-02-03 NOTE — Telephone Encounter (Signed)
Pt called requesting printed refill on Ambien.

## 2013-03-26 ENCOUNTER — Telehealth: Payer: Self-pay | Admitting: Family Medicine

## 2013-03-26 ENCOUNTER — Encounter: Payer: Self-pay | Admitting: Family Medicine

## 2013-03-26 DIAGNOSIS — M1990 Unspecified osteoarthritis, unspecified site: Secondary | ICD-10-CM | POA: Insufficient documentation

## 2013-03-26 DIAGNOSIS — H5440 Blindness, one eye, unspecified eye: Secondary | ICD-10-CM | POA: Insufficient documentation

## 2013-03-26 NOTE — Telephone Encounter (Signed)
Rec'd letter from [facility] Medicare plan is reference to Zolpidem that this patient takes.  It states it is a Tier 2 drug and wants to know if patient has tried a Tier 1 drug but does not tell what those option are.  According to the patient records, she has been on Ambien (Zolpidem) 10 mg  at bedtime as needed since transferring into our practice in 2008.  A Prior Auth form has been completed and sent to the plan.  Awaiting their response.

## 2013-03-27 NOTE — Telephone Encounter (Signed)
Received approval for Zolpidem for patient from [facility]  Approved for dates 02/24/2013 - 03/26/2014   Case # [id number].

## 2013-05-09 ENCOUNTER — Telehealth: Payer: Self-pay | Admitting: Family Medicine

## 2013-05-09 DIAGNOSIS — G4700 Insomnia, unspecified: Secondary | ICD-10-CM

## 2013-05-09 NOTE — Telephone Encounter (Signed)
Pt states can get 90 day supply of Zolpidem from her mail order if it is written and signed by MD.  They can not take more then 30 days at a time from PA/NP.  She uses [facility] and this is what they told her.

## 2013-05-12 MED ORDER — ZOLPIDEM TARTRATE 10 MG PO TABS: 10.0000 mg | ORAL_TABLET | Freq: Every evening | ORAL | Status: DC | PRN

## 2013-05-12 NOTE — Telephone Encounter (Signed)
I called patient and let her know what MD said.  Waiting on her to call me back.

## 2013-05-12 NOTE — Telephone Encounter (Signed)
I am okay writing this if MBD feels a 90 day supply is appropriate for this patient whom I am not familiar.  Await her opinion.  Meanwhile, 30 day supply until MBD returns.

## 2013-05-12 NOTE — Telephone Encounter (Signed)
Faxed one 30 day Rx.  Waiting on approval for 90 day Rx.  Pt aware

## 2013-05-13 ENCOUNTER — Ambulatory Visit (INDEPENDENT_AMBULATORY_CARE_PROVIDER_SITE_OTHER): Payer: Medicare Other | Admitting: Family Medicine

## 2013-05-13 DIAGNOSIS — Z23 Encounter for immunization: Secondary | ICD-10-CM | POA: Diagnosis not present

## 2013-05-17 NOTE — Telephone Encounter (Signed)
Approved. # 90 + 1 additional refill.

## 2013-05-19 NOTE — Telephone Encounter (Signed)
ok 

## 2013-05-21 MED ORDER — ZOLPIDEM TARTRATE 10 MG PO TABS: 10.0000 mg | ORAL_TABLET | Freq: Every evening | ORAL | Status: DC | PRN

## 2013-05-21 NOTE — Telephone Encounter (Signed)
rx printed for provider signature and faxed to mail order pharmacy

## 2013-06-09 ENCOUNTER — Other Ambulatory Visit: Payer: Self-pay

## 2013-06-09 DIAGNOSIS — Z1231 Encounter for screening mammogram for malignant neoplasm of breast: Secondary | ICD-10-CM

## 2013-07-11 ENCOUNTER — Other Ambulatory Visit: Payer: Medicare Other

## 2013-07-11 DIAGNOSIS — M1990 Unspecified osteoarthritis, unspecified site: Secondary | ICD-10-CM

## 2013-07-11 DIAGNOSIS — I10 Essential (primary) hypertension: Secondary | ICD-10-CM

## 2013-07-11 DIAGNOSIS — Z0000 Encounter for general adult medical examination without abnormal findings: Secondary | ICD-10-CM

## 2013-07-11 DIAGNOSIS — M810 Age-related osteoporosis without current pathological fracture: Secondary | ICD-10-CM | POA: Diagnosis not present

## 2013-07-11 DIAGNOSIS — R8290 Unspecified abnormal findings in urine: Secondary | ICD-10-CM

## 2013-07-11 DIAGNOSIS — Z79899 Other long term (current) drug therapy: Secondary | ICD-10-CM | POA: Diagnosis not present

## 2013-07-11 DIAGNOSIS — M129 Arthropathy, unspecified: Secondary | ICD-10-CM | POA: Diagnosis not present

## 2013-07-11 DIAGNOSIS — R82998 Other abnormal findings in urine: Secondary | ICD-10-CM | POA: Diagnosis not present

## 2013-07-11 LAB — URINALYSIS, MICROSCOPIC ONLY
Casts: NONE SEEN
Crystals: NONE SEEN
RBC / HPF: NONE SEEN RBC/hpf (ref <3)

## 2013-07-11 LAB — COMPLETE METABOLIC PANEL WITH GFR
ALT: 13 U/L (ref 0–35)
AST: 14 U/L (ref 0–37)
Albumin: 4.2 g/dL (ref 3.5–5.2)
Alkaline Phosphatase: 66 U/L (ref 39–117)
BUN: 21 mg/dL (ref 6–23)
CO2: 26 mEq/L (ref 19–32)
Calcium: 9.1 mg/dL (ref 8.4–10.5)
Chloride: 107 mEq/L (ref 96–112)
Creat: 1.02 mg/dL (ref 0.50–1.10)
GFR, Est African American: 65 mL/min
GFR, Est Non African American: 56 mL/min — ABNORMAL LOW
Glucose, Bld: 94 mg/dL (ref 70–99)
Potassium: 4.6 mEq/L (ref 3.5–5.3)
Sodium: 141 mEq/L (ref 135–145)
Total Bilirubin: 0.4 mg/dL (ref 0.2–1.2)
Total Protein: 6.1 g/dL (ref 6.0–8.3)

## 2013-07-11 LAB — URINALYSIS, ROUTINE W REFLEX MICROSCOPIC
Bilirubin Urine: NEGATIVE
Glucose, UA: NEGATIVE mg/dL
Hgb urine dipstick: NEGATIVE
Ketones, ur: NEGATIVE mg/dL
Nitrite: NEGATIVE
Protein, ur: NEGATIVE mg/dL
Specific Gravity, Urine: 1.015 (ref 1.005–1.030)
Urobilinogen, UA: 0.2 mg/dL (ref 0.0–1.0)
pH: 7 (ref 5.0–8.0)

## 2013-07-11 LAB — CBC WITH DIFFERENTIAL/PLATELET
Basophils Absolute: 0.1 10*3/uL (ref 0.0–0.1)
Basophils Relative: 1 % (ref 0–1)
Eosinophils Absolute: 0.4 10*3/uL (ref 0.0–0.7)
Eosinophils Relative: 6 % — ABNORMAL HIGH (ref 0–5)
HCT: 36.8 % (ref 36.0–46.0)
Hemoglobin: 12.1 g/dL (ref 12.0–15.0)
Lymphocytes Relative: 27 % (ref 12–46)
Lymphs Abs: 1.8 10*3/uL (ref 0.7–4.0)
MCH: 26.9 pg (ref 26.0–34.0)
MCHC: 32.9 g/dL (ref 30.0–36.0)
MCV: 81.8 fL (ref 78.0–100.0)
Monocytes Absolute: 0.7 10*3/uL (ref 0.1–1.0)
Monocytes Relative: 11 % (ref 3–12)
Neutro Abs: 3.6 10*3/uL (ref 1.7–7.7)
Neutrophils Relative %: 55 % (ref 43–77)
Platelets: 203 10*3/uL (ref 150–400)
RBC: 4.5 MIL/uL (ref 3.87–5.11)
RDW: 14.4 % (ref 11.5–15.5)
WBC: 6.6 10*3/uL (ref 4.0–10.5)

## 2013-07-11 LAB — LIPID PANEL
Cholesterol: 187 mg/dL (ref 0–200)
HDL: 49 mg/dL (ref >39)
LDL Cholesterol: 120 mg/dL — ABNORMAL HIGH (ref 0–99)
Total CHOL/HDL Ratio: 3.8 Ratio
Triglycerides: 92 mg/dL (ref <150)
VLDL: 18 mg/dL (ref 0–40)

## 2013-07-11 LAB — TSH: TSH: 2.222 u[IU]/mL (ref 0.350–4.500)

## 2013-07-12 LAB — VITAMIN D 25 HYDROXY (VIT D DEFICIENCY, FRACTURES): Vit D, 25-Hydroxy: 56 ng/mL (ref 30–89)

## 2013-07-13 ENCOUNTER — Other Ambulatory Visit: Payer: Self-pay | Admitting: Family Medicine

## 2013-07-15 ENCOUNTER — Ambulatory Visit
Admission: RE | Admit: 2013-07-15 | Discharge: 2013-07-15 | Disposition: A | Discharge status: Home / Self Care | Payer: Medicare Other | Source: Ambulatory Visit

## 2013-07-15 ENCOUNTER — Encounter (INDEPENDENT_AMBULATORY_CARE_PROVIDER_SITE_OTHER): Payer: Self-pay

## 2013-07-15 DIAGNOSIS — Z1231 Encounter for screening mammogram for malignant neoplasm of breast: Secondary | ICD-10-CM

## 2013-07-16 ENCOUNTER — Encounter: Payer: Self-pay | Admitting: Physician Assistant

## 2013-07-16 ENCOUNTER — Ambulatory Visit (INDEPENDENT_AMBULATORY_CARE_PROVIDER_SITE_OTHER): Payer: Medicare Other | Admitting: Physician Assistant

## 2013-07-16 VITALS — BP 108/64 | HR 60 | Temp 98.1°F | Resp 18 | Ht 63.0 in | Wt 154.0 lb

## 2013-07-16 DIAGNOSIS — M129 Arthropathy, unspecified: Secondary | ICD-10-CM

## 2013-07-16 DIAGNOSIS — M545 Low back pain, unspecified: Secondary | ICD-10-CM

## 2013-07-16 DIAGNOSIS — I10 Essential (primary) hypertension: Secondary | ICD-10-CM

## 2013-07-16 DIAGNOSIS — Z1211 Encounter for screening for malignant neoplasm of colon: Secondary | ICD-10-CM

## 2013-07-16 DIAGNOSIS — M1990 Unspecified osteoarthritis, unspecified site: Secondary | ICD-10-CM

## 2013-07-16 DIAGNOSIS — G4700 Insomnia, unspecified: Secondary | ICD-10-CM | POA: Diagnosis not present

## 2013-07-16 DIAGNOSIS — Z0000 Encounter for general adult medical examination without abnormal findings: Secondary | ICD-10-CM | POA: Diagnosis not present

## 2013-07-16 DIAGNOSIS — H5440 Blindness, one eye, unspecified eye: Secondary | ICD-10-CM

## 2013-07-16 DIAGNOSIS — K648 Other hemorrhoids: Secondary | ICD-10-CM

## 2013-07-16 NOTE — Progress Notes (Signed)
Patient ID: Audrey David MRN: [id number], DOB: 02/28/1945, 68 y.o. Date of Encounter: 07/16/2013,   Chief Complaint: Physical (CPE)  HPI: 68 y.o. y/o white female  here for CPE.   Today she does report that she is still having a lot of problems with low back pain. Says it sometimes goes down her right leg. Says she deals with this pain every day. Has some problems with it when trying to sleep at night. Also when she first wakes up in the morning. Throughout the day it is something that she " deals with."   Review of Systems: Consitutional: No fever, chills, fatigue, night sweats, lymphadenopathy. No significant/unexplained weight changes. Eyes: No visual changes, eye redness, or discharge. ENT/Mouth: No ear pain, sore throat, nasal drainage, or sinus pain. Cardiovascular: No chest pressure,heaviness, tightness or squeezing, even with exertion. No increased shortness of breath or dyspnea on exertion.No palpitations, edema, orthopnea, PND. Respiratory: No cough, hemoptysis, SOB, or wheezing. Gastrointestinal: No anorexia, dysphagia, reflux, pain, nausea, vomiting, hematemesis, diarrhea, constipation, BRBPR, or melena. Breast: No mass, nodules, bulging, or retraction. No skin changes or inflammation. No nipple discharge. No lymphadenopathy. Genitourinary: No dysuria, hematuria, incontinence, vaginal discharge, pruritis, burning, abnormal bleeding, or pain. Musculoskeletal: No decreased ROM, No joint pain or swelling. No significant pain in neck, or extremities. Skin: No rash, pruritis, or concerning lesions. Neurological: No headache, dizziness, syncope, seizures, tremors, memory loss, coordination problems, or paresthesias. Psychological: No anxiety, depression, hallucinations, SI/HI. Endocrine: No polydipsia, polyphagia, polyuria, or known diabetes.No increased fatigue. No palpitations/rapid heart rate. No significant/unexplained weight change. All other systems were reviewed and are  otherwise negative.  Past Medical History  Diagnosis Date  . Phlebitis   . Hemorrhoids   . Hypertension   . Insomnia   . Arthritis      Past Surgical History  Procedure Laterality Date  . Abdominal hysterectomy      Home Meds:  Outpatient Prescriptions Prior to Visit  Medication Sig Dispense Refill  . lisinopril (PRINIVIL,ZESTRIL) 10 MG tablet Take 1 tablet (10 mg total) by mouth daily.  90 tablet  3  . meloxicam (MOBIC) 15 MG tablet TAKE 1 TABLET DAILY AS NEEDED  90 tablet  1  . tiZANidine (ZANAFLEX) 2 MG tablet Take 1 tablet (2 mg total) by mouth every 6 (six) hours as needed.  60 tablet  2  . zolpidem (AMBIEN) 10 MG tablet Take 1 tablet (10 mg total) by mouth at bedtime as needed for sleep. Use as bedtime as directed  90 tablet  1  . predniSONE (DELTASONE) 20 MG tablet Take 3 daily for 2 days, then 2 daily for 2 days, then 1 daily for 2 days.  12 tablet  0   No facility-administered medications prior to visit.    Allergies: No Known Allergies  History   Social History  . Marital Status: Single    Spouse Name: N/A    Number of Children: N/A  . Years of Education: N/A   Occupational History  . Not on file.   Social History Main Topics  . Smoking status: Former Research scientist (life sciences)  . Smokeless tobacco: Never Used  . Alcohol Use: Yes     Comment: Social   . Drug Use: No  . Sexual Activity: Not on file   Other Topics Concern  . Not on file   Social History Narrative   Daily caffeine     Family History  Problem Relation Age of Onset  . Hypertension Mother   . Cancer  Father 59    multiple myeloma. Throat Cancer.    Physical Exam: Blood pressure 108/64, pulse 60, temperature 98.1 F (36.7 C), temperature source Oral, resp. rate 18, height $RemoveBe'5\' 3"'dNfvsmmGC$  (1.6 m), weight 154 lb (69.854 kg)., Body mass index is 27.29 kg/(m^2). General: Well developed, well nourished, WF. Appears  in no acute distress. HEENT: Normocephalic, atraumatic. Conjunctiva pink, sclera non-icteric. Pupils  2 mm constricting to 1 mm, round, regular, and equally reactive to light and accomodation. EOMI. Internal auditory canal clear. TMs with good cone of light and without pathology. Nasal mucosa pink. Nares are without discharge. No sinus tenderness. Oral mucosa pink.  Pharynx without exudate.   Neck: Supple. Trachea midline. No thyromegaly. Full ROM. No lymphadenopathy.No Carotid Bruits. Lungs: Clear to auscultation bilaterally without wheezes, rales, or rhonchi. Breathing is of normal effort and unlabored. Cardiovascular: RRR with S1 S2. No murmurs, rubs, or gallops. Distal pulses 2+ symmetrically. No carotid or abdominal bruits. Breast: Symmetrical. No masses. Nipples without discharge. Abdomen: Soft, non-tender, non-distended with normoactive bowel sounds. No hepatosplenomegaly or masses. No rebound/guarding. No CVA tenderness. No hernias.  Genitourinary:  External genitalia without lesions. Vaginal mucosa pink.No discharge present. H/O Partial Hysterectomy-findings c/w this.  No adnexal mass or tenderness. Musculoskeletal: Full range of motion and 5/5 strength throughout. Without swelling, atrophy, tenderness, crepitus, or warmth. Extremities without clubbing, cyanosis, or edema. Calves supple. Skin: Warm and moist without erythema, ecchymosis, wounds, or rash. Neuro: A+Ox3. CN II-XII grossly intact. Moves all extremities spontaneously. Full sensation throughout. Normal gait. DTR 2+ throughout upper and lower extremities. Finger to nose intact. Psych:  Responds to questions appropriately with a normal affect.   Assessment/Plan:  68 y.o. y/o female here for CPE  1. Visit for preventive health examination  A. Screening Labs: She recently came for fasting for labs. Labs were good. She had CBC, CMET, FLP, TSH, vitamin D  B. Pap: She has had a hysterectomy without oophorectomy.  Hysterectomy was not secondary to cancer so she does not need any further Pap smears. Pelvic exam performed  today.  C. Screening Mammogram: She reports that she had this done yesterday.  D. DEXA/BMD:  This was done 04/18/2010. Normal. On calcium, vitamin D, exercise.  E. Colorectal Cancer Screening: At prior complete physical with me she had told me she had had a colonoscopy in [place] in 2007. She said she was told to repeat in 5 years. However she does not have those records. Today she reports that she recently saw GI secondary to some hemorrhoids. See #7 below. This diagnosis was entered into the computer by GI. GI is following this.  F. Immunizations:  Influenza: N/A Tetanus: 11/02/2011 Pneumococcal: She received Prevnar 13 here on 05/13/13.  To wait 6-12 months after that date to give the Pneumovax 23. Zostavax: She received this 07/13/2010    2. Hypertension Blood  pressure is at goal. Lab  normal. Continue current medication.  3. Insomnia Controlled current medication  4. Blind left eye  5. Arthritis Continue  current medication  6. Internal hemorrhoids without mention of complication Per GI  7. Special screening for malignant neoplasms, colon Per GI  8. Low Back Pain with Right Sciatica: I reviewed she has already had a lumbar spine x-ray which showed abnormalities. Discussed with her whether to proceed with getting a followup MRI. She states that this pain is causing her a lot of problems and is occurring on a daily basis. She does want me to schedule MRI and follow up with  further evaluation of this. Will order MRI now.   Signed, [address] [place], [place], [facility] 07/16/2013 11:12 AM

## 2013-07-18 ENCOUNTER — Ambulatory Visit
Admission: RE | Admit: 2013-07-18 | Discharge: 2013-07-18 | Disposition: A | Discharge status: Home / Self Care | Payer: Medicare Other | Source: Ambulatory Visit | Attending: Physician Assistant | Admitting: Physician Assistant

## 2013-07-18 DIAGNOSIS — M545 Low back pain, unspecified: Secondary | ICD-10-CM

## 2013-07-18 DIAGNOSIS — M48061 Spinal stenosis, lumbar region without neurogenic claudication: Secondary | ICD-10-CM | POA: Diagnosis not present

## 2013-07-18 DIAGNOSIS — M47817 Spondylosis without myelopathy or radiculopathy, lumbosacral region: Secondary | ICD-10-CM | POA: Diagnosis not present

## 2013-07-18 DIAGNOSIS — M5126 Other intervertebral disc displacement, lumbar region: Secondary | ICD-10-CM | POA: Diagnosis not present

## 2013-07-22 ENCOUNTER — Other Ambulatory Visit: Payer: Medicare Other

## 2013-07-28 ENCOUNTER — Telehealth: Payer: Self-pay | Admitting: Family Medicine

## 2013-07-28 DIAGNOSIS — R937 Abnormal findings on diagnostic imaging of other parts of musculoskeletal system: Secondary | ICD-10-CM

## 2013-07-28 DIAGNOSIS — M5440 Lumbago with sciatica, unspecified side: Secondary | ICD-10-CM

## 2013-07-28 NOTE — Telephone Encounter (Signed)
Message copied by Donne Anon on Mon Jul 28, 2013  3:03 PM ------      Message from: Allayne Butcher      Created: Sun Jul 27, 2013 11:53 AM       At her recent OV, pt c/o back pain for many months and that back pain is a problem for her on daily basis.       Tell her MRI shows multiple areas of bulge and other abnormalities.      Tell her that we need to refer her to a back/spine specialist.      Find out if she has seen one in the past or has any preference of who she sees.      If so, refer her there.      If not, refer her to Dr.Brookes at [facility] orthopedics ------

## 2013-07-28 NOTE — Telephone Encounter (Signed)
Pt aware of MRI results and referral for Dr Fontaine No initiated

## 2013-08-29 DIAGNOSIS — M48061 Spinal stenosis, lumbar region without neurogenic claudication: Secondary | ICD-10-CM | POA: Diagnosis not present

## 2013-08-29 DIAGNOSIS — M4310 Spondylolisthesis, site unspecified: Secondary | ICD-10-CM | POA: Diagnosis not present

## 2013-09-03 ENCOUNTER — Ambulatory Visit: Payer: Medicare Other | Admitting: Physician Assistant

## 2013-09-04 ENCOUNTER — Ambulatory Visit: Payer: Medicare Other | Admitting: Physician Assistant

## 2013-09-07 ENCOUNTER — Other Ambulatory Visit: Payer: Self-pay | Admitting: Physician Assistant

## 2013-09-08 NOTE — Telephone Encounter (Signed)
Refill appropriate and filled per protocol. 

## 2013-09-16 DIAGNOSIS — M545 Low back pain, unspecified: Secondary | ICD-10-CM | POA: Diagnosis not present

## 2013-11-24 ENCOUNTER — Telehealth: Payer: Self-pay | Admitting: Physician Assistant

## 2013-11-24 DIAGNOSIS — G4700 Insomnia, unspecified: Secondary | ICD-10-CM

## 2013-11-24 MED ORDER — ZOLPIDEM TARTRATE 10 MG PO TABS
10.0000 mg | ORAL_TABLET | Freq: Every evening | ORAL | Status: DC | PRN
Start: 2013-11-24 — End: 2014-03-11

## 2013-11-24 NOTE — Telephone Encounter (Signed)
Last RF 05/21/13 #90 + 1  OK refill?

## 2013-11-24 NOTE — Telephone Encounter (Signed)
Patient is calling to get rx printed and she will send it to the pharmacy it is  zolpidem (AMBIEN) 10 MG tablet      [facility]

## 2013-11-24 NOTE — Telephone Encounter (Signed)
Approved for #90+1 

## 2013-11-24 NOTE — Telephone Encounter (Signed)
[email or web address]

## 2013-12-12 DIAGNOSIS — H02834 Dermatochalasis of left upper eyelid: Secondary | ICD-10-CM | POA: Diagnosis not present

## 2013-12-12 DIAGNOSIS — H02831 Dermatochalasis of right upper eyelid: Secondary | ICD-10-CM | POA: Diagnosis not present

## 2013-12-12 DIAGNOSIS — H04123 Dry eye syndrome of bilateral lacrimal glands: Secondary | ICD-10-CM | POA: Diagnosis not present

## 2013-12-12 DIAGNOSIS — E119 Type 2 diabetes mellitus without complications: Secondary | ICD-10-CM | POA: Diagnosis not present

## 2013-12-12 DIAGNOSIS — H2513 Age-related nuclear cataract, bilateral: Secondary | ICD-10-CM | POA: Diagnosis not present

## 2013-12-12 LAB — HM DIABETES EYE EXAM

## 2013-12-17 ENCOUNTER — Encounter: Payer: Self-pay | Admitting: Family Medicine

## 2013-12-19 ENCOUNTER — Other Ambulatory Visit: Payer: Self-pay | Admitting: Family Medicine

## 2014-02-16 ENCOUNTER — Telehealth: Payer: Self-pay | Admitting: Family Medicine

## 2014-02-16 NOTE — Telephone Encounter (Signed)
PA request for Zolpidem Tartrate 10 mg.  Questionnaire completed and returned to pharmacy.  Have rec'd Approval for Zolpidem from 01/17/2014 thru 02/16/2015  Case ID [id number].

## 2014-03-10 ENCOUNTER — Telehealth: Payer: Self-pay | Admitting: Physician Assistant

## 2014-03-10 DIAGNOSIS — G4700 Insomnia, unspecified: Secondary | ICD-10-CM

## 2014-03-10 NOTE — Telephone Encounter (Signed)
Patient is calling to get refill on her [name], [facility] and a 90 day supply is what she is requesting  [phone] if any questions

## 2014-03-10 NOTE — Telephone Encounter (Signed)
Last refill 11/24/13 #90 + 1  To mail order.  LOV 07/16/13  OK refill?

## 2014-03-11 MED ORDER — ZOLPIDEM TARTRATE 10 MG PO TABS: 10.0000 mg | ORAL_TABLET | Freq: Every evening | ORAL | Status: DC | PRN

## 2014-03-11 NOTE — Telephone Encounter (Signed)
Approved #90+1 

## 2014-03-11 NOTE — Telephone Encounter (Signed)
Prescription faxed

## 2014-03-19 ENCOUNTER — Other Ambulatory Visit: Payer: Self-pay | Admitting: Physician Assistant

## 2014-03-19 NOTE — Telephone Encounter (Signed)
Medication refilled per protocol. 

## 2014-03-23 ENCOUNTER — Telehealth: Payer: Self-pay | Admitting: Physician Assistant

## 2014-03-23 NOTE — Telephone Encounter (Signed)
Patient is calling to get refill on her leg cramp med tizanidine 2 [facility]  [phone]

## 2014-03-24 NOTE — Telephone Encounter (Signed)
LRF 12/2 #60 + 2 to [facility].  Now wants 90 day supply to mail order.  OK refill?

## 2014-03-25 MED ORDER — TIZANIDINE HCL 2 MG PO TABS: 2.0000 mg | ORAL_TABLET | Freq: Four times a day (QID) | ORAL | Status: DC | PRN

## 2014-03-25 NOTE — Telephone Encounter (Signed)
Rx faxed to pharmacy  

## 2014-03-25 NOTE — Telephone Encounter (Signed)
One by mouth every 8 hours when necessary #180+1 additional refill

## 2014-05-13 ENCOUNTER — Other Ambulatory Visit: Payer: Self-pay | Admitting: Physician Assistant

## 2014-05-13 NOTE — Telephone Encounter (Signed)
Medication refilled per protocol. 

## 2014-05-14 IMAGING — CT CT HEAD WO/W CM
3 series · 17 of 30 positions shown, 19 images · IV contrast (omnipaque)
Comparison: None

CLINICAL DATA: Biparietal and frontal headaches for 2 weeks. Sound
dizziness. Patient has blindness the left thigh reportedly caused by
birth control pills, presumably a vascular etiology.

EXAM:
CT HEAD WITHOUT AND WITH CONTRAST
TECHNIQUE: Contiguous axial images were obtained from the base of the skull
through the vertex without and with intravenous contrast
CONTRAST:  75mL OMNIPAQUE IOHEXOL 300 MG/ML  SOLN

[Series 3: head bone · axial · 0.45mm/px · z∈[+25,+93]mm · 5 of 56 slices shown]
[im 5/56  bone]
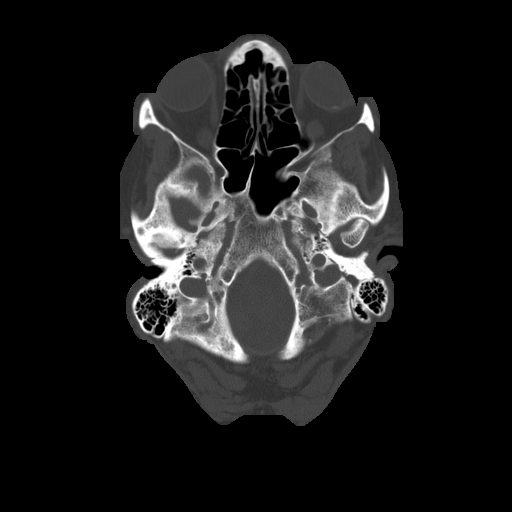
[im 13/56  bone]
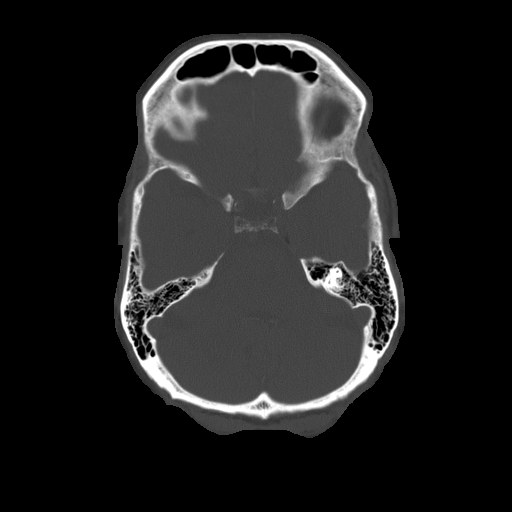
[im 17/56  bone]
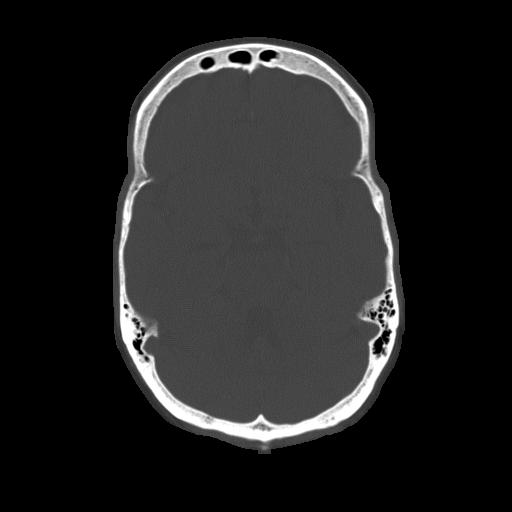
[im 26/56  bone]
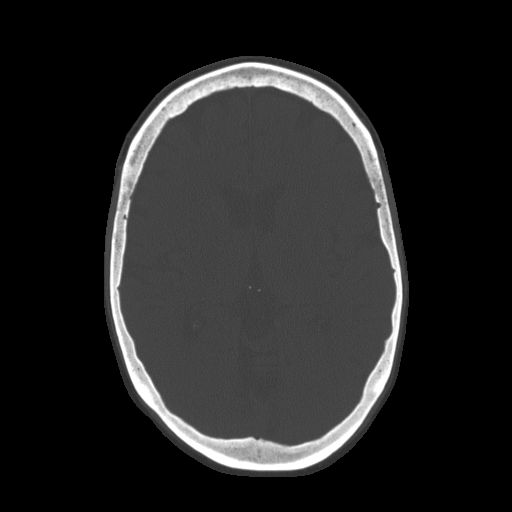
[im 30/56  bone]
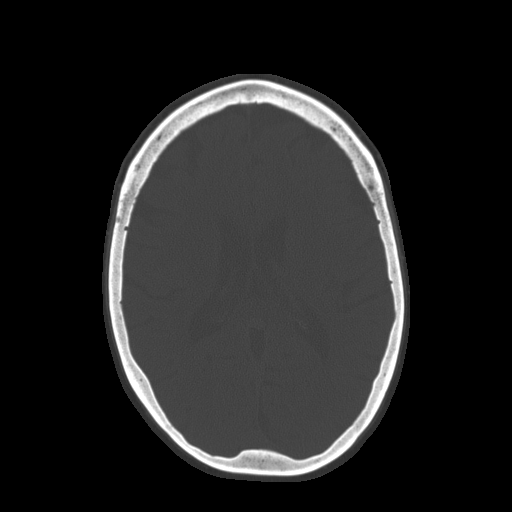

[Series 32: 3d filtered head w/o · axial · non-contrast · 0.45mm/px · z∈[+32,+140]mm · 6 of 28 slices shown, 8 images]
[im 4/28  brain]
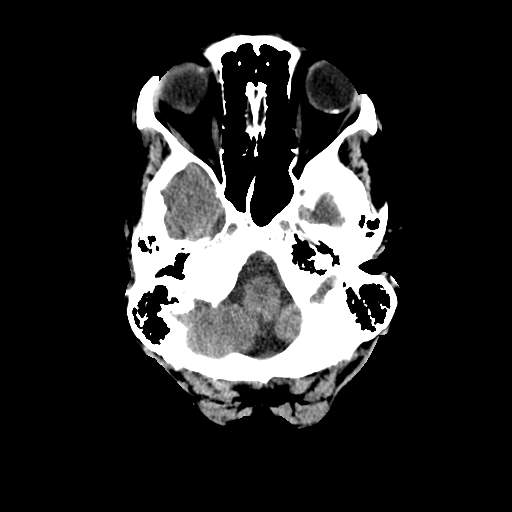
[im 4/28  bone]
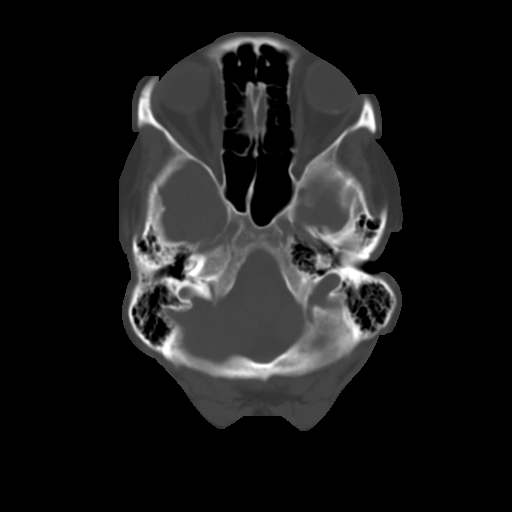
[im 8/28  brain]
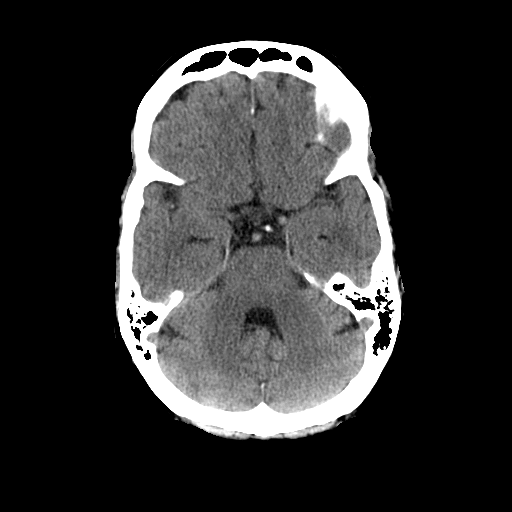
[im 12/28  brain]
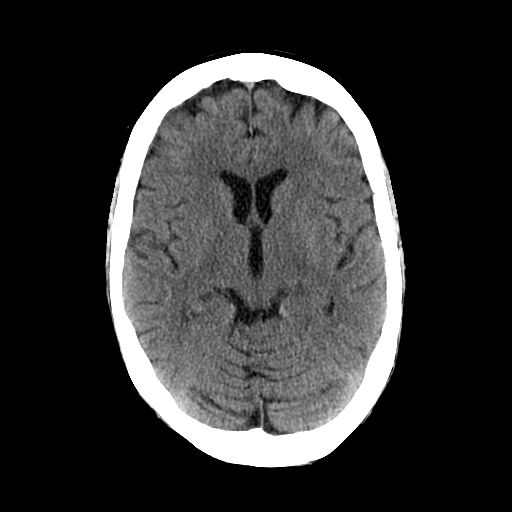
[im 16/28  brain]
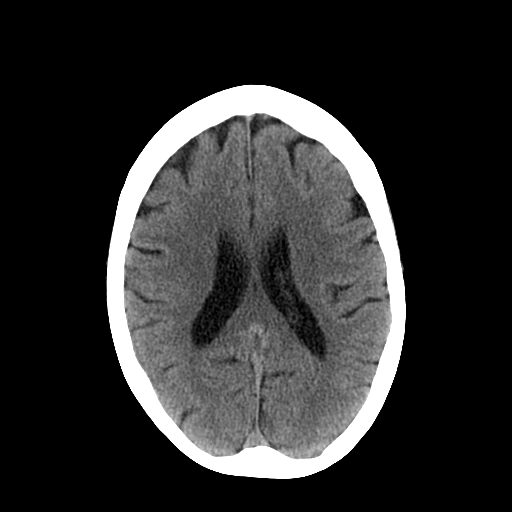
[im 20/28  brain]
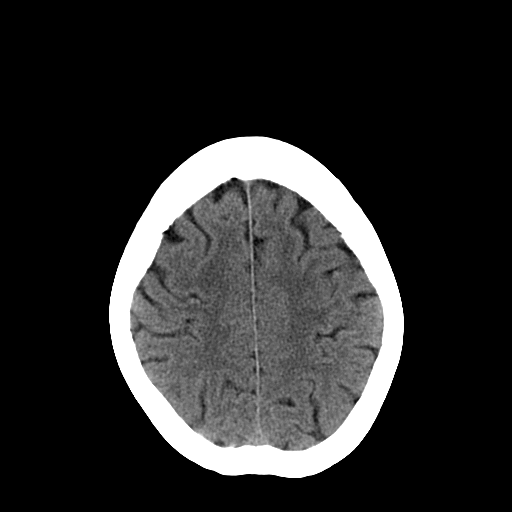
[im 20/28  bone]
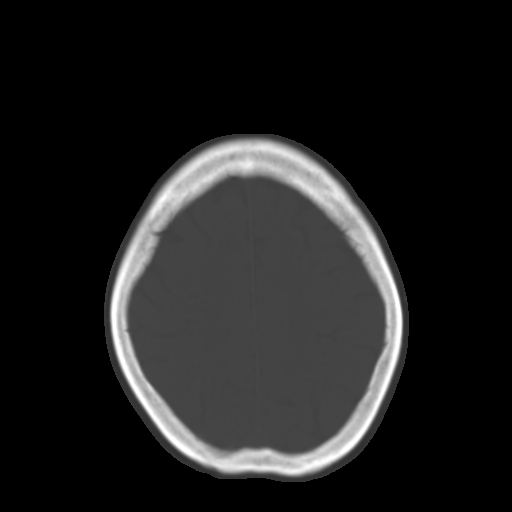
[im 24/28  brain]
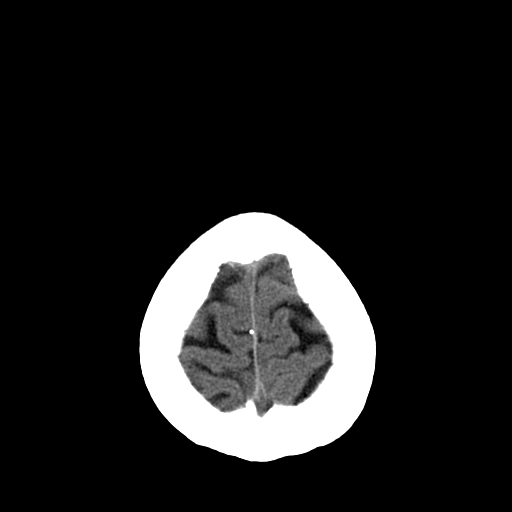

[Series 34: 3d filtered head w/ · axial · 0.45mm/px · z∈[+32,+140]mm · 6 of 28 slices shown]
[im 4/28  brain]
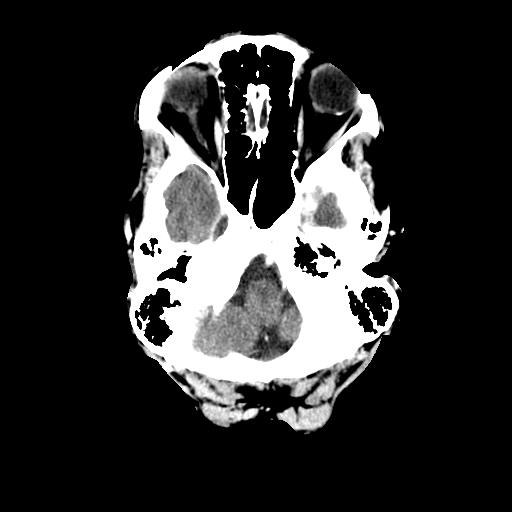
[im 8/28  brain]
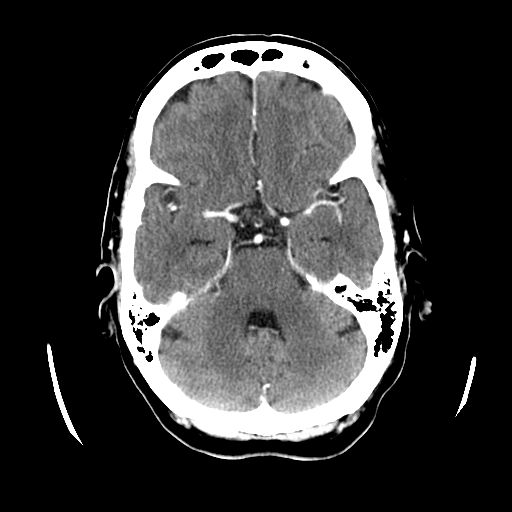
[im 12/28  brain]
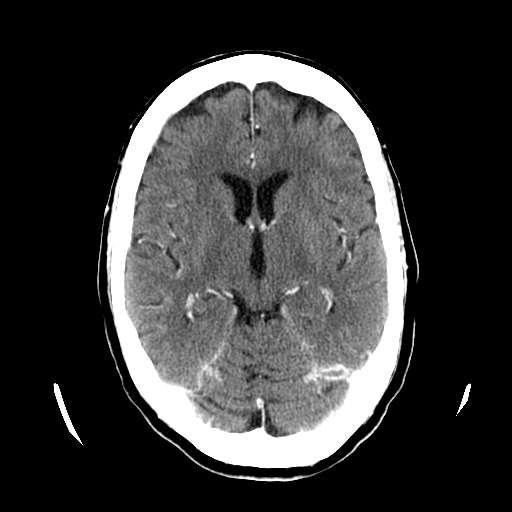
[im 16/28  brain]
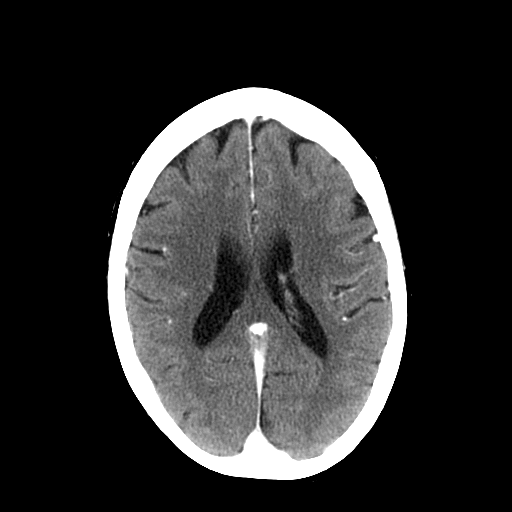
[im 20/28  brain]
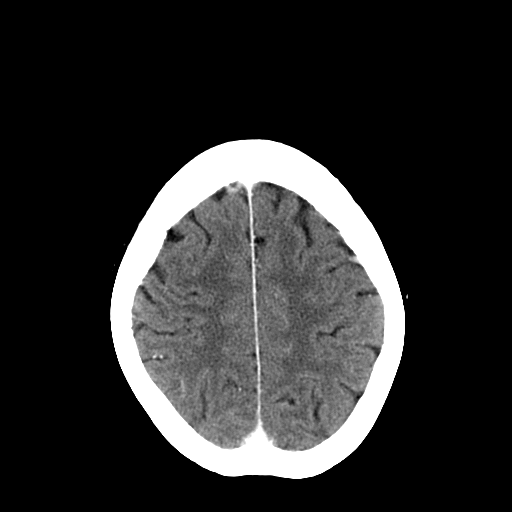
[im 24/28  brain]
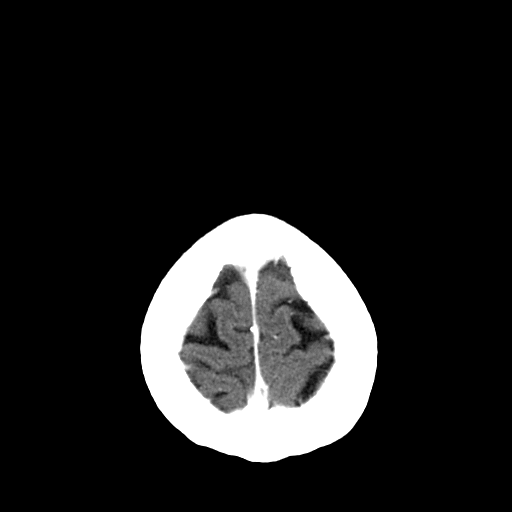

[17 of 30 positions shown; findings below may reference images not displayed]

FINDINGS: The ventricles are normal in size and configuration. There are no
parenchymal masses or mass effect. Minimal areas of white matter
hypoattenuation are noted most consistent with chronic microvascular
ischemic change. There is no evidence of an infarct. There are no
areas of abnormal parenchymal enhancement. There are no extra-axial
masses, abnormal fluid collections or areas of abnormal enhancement.
Mild atherosclerotic calcification is noted in the cavernous
portions of the internal carotid arteries. The skullbase vessels
show normal enhancement.The visualized sinuses and mastoid air cells
are clear. There are calcifications within the left globe and along
the left lens.
IMPRESSION: 1. No acute findings.
2. No masses or areas of abnormal enhancement.
3. Minimal chronic microvascular ischemic change.
4. Chronic left globe calcifications.

## 2014-05-27 ENCOUNTER — Encounter: Payer: Self-pay | Admitting: Physician Assistant

## 2014-05-27 ENCOUNTER — Ambulatory Visit (INDEPENDENT_AMBULATORY_CARE_PROVIDER_SITE_OTHER): Payer: Medicare Other | Admitting: Physician Assistant

## 2014-05-27 VITALS — BP 162/104 | HR 72 | Temp 98.2°F | Resp 18 | Wt 152.0 lb

## 2014-05-27 DIAGNOSIS — I10 Essential (primary) hypertension: Secondary | ICD-10-CM

## 2014-05-27 DIAGNOSIS — R3915 Urgency of urination: Secondary | ICD-10-CM

## 2014-05-27 LAB — URINALYSIS, ROUTINE W REFLEX MICROSCOPIC
Bilirubin Urine: NEGATIVE
Glucose, UA: NEGATIVE mg/dL
Hgb urine dipstick: NEGATIVE
Ketones, ur: NEGATIVE mg/dL
Nitrite: NEGATIVE
Protein, ur: NEGATIVE mg/dL
Specific Gravity, Urine: 1.02 (ref 1.005–1.030)
Urobilinogen, UA: 0.2 mg/dL (ref 0.0–1.0)
pH: 6 (ref 5.0–8.0)

## 2014-05-27 LAB — URINALYSIS, MICROSCOPIC ONLY
Casts: NONE SEEN
Crystals: NONE SEEN

## 2014-05-27 MED ORDER — TRAMADOL HCL 50 MG PO TABS: ORAL_TABLET | ORAL | Status: DC

## 2014-05-27 MED ORDER — AMLODIPINE BESYLATE 5 MG PO TABS: 5.0000 mg | ORAL_TABLET | Freq: Every day | ORAL | Status: DC

## 2014-05-27 NOTE — Progress Notes (Signed)
Patient ID: Audrey David MRN: [id number], DOB: Sep 20, 1945, 69 y.o. Date of Encounter: $[email or web address]'@[email or web address]'[email or web address]$ @  Chief Complaint:  Chief Complaint  Patient presents with  . multiple complaints    back pain w/ radiating pain, cough, UTI, discuss fibromyalgia    HPI: 69 y.o. year old white female  presents with above symptoms.  Says that she has been having a lot of pain across both sides of her low back. Pain goes down her leg right leg much more so than the left but occasionally down the left.  Goes all the way down the leg to the foot. Says she feels this especially when she sneezes or coughs. Also says that in the mornings when she is waking up it is horrible to get her legs straightened out in the bed. When she goes to get out of bed it is also significant discomfort when she goes to stand up. Says that in August 2015 she saw Dr. Rolena Infante and had injection. Just has not had follow-up with Dr. Rolena Infante and had not followed up with anyone about this and has been enduring this pain.  Also says that she has a dry cough likely is a tickle in her throat off and on for quite some time.  Says that she is not having any symptoms of a UTI. Says that she had told the nurse that because she saw the MRI report of her back mentioned atrophic kidney and she was concerned about what that meant and thought she should check her urine.   Past Medical History  Diagnosis Date  . Phlebitis   . Hemorrhoids   . Hypertension   . Insomnia   . Arthritis      Home Meds: Outpatient Prescriptions Prior to Visit  Medication Sig Dispense Refill  . meloxicam (MOBIC) 15 MG tablet TAKE 1 TABLET DAILY AS NEEDED 90 tablet 1  . tiZANidine (ZANAFLEX) 2 MG tablet Take 1 tablet (2 mg total) by mouth every 6 (six) hours as needed. 180 tablet 1  . zolpidem (AMBIEN) 10 MG tablet Take 1 tablet (10 mg total) by mouth at bedtime as needed for sleep. Use as bedtime as directed 90 tablet 1  . lisinopril (PRINIVIL,ZESTRIL) 10 MG tablet TAKE  1 TABLET DAILY 90 tablet 0   No facility-administered medications prior to visit.    Allergies: No Known Allergies  History   Social History  . Marital Status: Single    Spouse Name: N/A  . Number of Children: N/A  . Years of Education: N/A   Occupational History  . Not on file.   Social History Main Topics  . Smoking status: Former Research scientist (life sciences)  . Smokeless tobacco: Never Used  . Alcohol Use: Yes     Comment: Social   . Drug Use: No  . Sexual Activity: Not on file   Other Topics Concern  . Not on file   Social History Narrative   Daily caffeine     Family History  Problem Relation Age of Onset  . Hypertension Mother   . Cancer Father 41    multiple myeloma. Throat Cancer.     Review of Systems:  See HPI for pertinent ROS. All other ROS negative.    Physical Exam: Blood pressure 162/104, pulse 72, temperature 98.2 F (36.8 C), temperature source Oral, resp. rate 18, weight 152 lb (68.947 kg)., Body mass index is 26.93 kg/(m^2). General: WNWD WF.Appears in no acute distress. Neck: Supple. No thyromegaly. No lymphadenopathy. No carotid bruits. Lungs: Clear bilaterally  to auscultation without wheezes, rales, or rhonchi. Breathing is unlabored. Heart: RRR with S1 S2. No murmurs, rubs, or gallops. Musculoskeletal:  Strength and tone normal for age. Extremities/Skin: Warm and dry.No edema. Neuro: Alert and oriented X 3. Moves all extremities spontaneously. Gait is normal. CNII-XII grossly in tact. Psych:  Responds to questions appropriately with a normal affect.     ASSESSMENT AND PLAN:  69 y.o. year old female with  1. Essential hypertension I think her cough is most likely secondary to ACE inhibitor. Will stop ACE inhibitor and change to amlodipine 5 mg. We'll have her schedule follow-up office visit in 2 weeks to recheck blood pressure. - amLODipine (NORVASC) 5 MG tablet; Take 1 tablet (5 mg total) by mouth daily.  Dispense: 30 tablet; Refill: 1  2. Back pain---I  told her that she needs to follow-up with Dr. Rolena Infante. Since she has been seen by him within a year she should be able to call and schedule follow-up herself. However if his office needs a referral from [place] we will be happy to do so she just lets [place] know. He is to go home and call his office and schedule follow-up appointment with him today. She is wanting some medicine to use for pain until she can see him. Give her some tramadol. - traMADol (ULTRAM) 50 MG tablet; Take 1 - 2 every 8 hours as needed for pain  Dispense: 90 tablet; Refill: 0  2. Urgency of urination I reviewed the MRI report which mentions slight atrophic kidney. Reviewed with her that her last BMET showed normal BUN and creatinine and very slightly decreased GFR. Offered to recheck another bmet today but she defers. Reassured her that her urinalysis looks fine. - Urinalysis, Routine w reflex microscopic Results for orders placed or performed in visit on 05/27/14  Urinalysis, Routine w reflex microscopic  Result Value Ref Range   Color, Urine YELLOW YELLOW   APPearance CLEAR CLEAR   Specific Gravity, Urine 1.020 1.005 - 1.030   pH 6.0 5.0 - 8.0   Glucose, UA NEG NEG mg/dL   Bilirubin Urine NEG NEG   Ketones, ur NEG NEG mg/dL   Hgb urine dipstick NEG NEG   Protein, ur NEG NEG mg/dL   Urobilinogen, UA 0.2 0.0 - 1.0 mg/dL   Nitrite NEG NEG   Leukocytes, UA TRACE (A) NEG  Urine Microscopic  Result Value Ref Range   Squamous Epithelial / LPF FEW RARE   Crystals NONE SEEN NONE SEEN   Casts NONE SEEN NONE SEEN   WBC, UA 0-2 <3 WBC/hpf   RBC / HPF 0-2 <3 RBC/hpf   Bacteria, UA FEW (A) RARE      Signed, [address] [place], [place], [facility] 05/27/2014 6:34 PM

## 2014-05-28 DIAGNOSIS — M4807 Spinal stenosis, lumbosacral region: Secondary | ICD-10-CM | POA: Diagnosis not present

## 2014-05-28 DIAGNOSIS — M4317 Spondylolisthesis, lumbosacral region: Secondary | ICD-10-CM | POA: Diagnosis not present

## 2014-05-29 DIAGNOSIS — M4807 Spinal stenosis, lumbosacral region: Secondary | ICD-10-CM | POA: Diagnosis not present

## 2014-06-04 ENCOUNTER — Other Ambulatory Visit: Payer: Self-pay

## 2014-06-04 DIAGNOSIS — Z1231 Encounter for screening mammogram for malignant neoplasm of breast: Secondary | ICD-10-CM

## 2014-06-16 DIAGNOSIS — M4317 Spondylolisthesis, lumbosacral region: Secondary | ICD-10-CM | POA: Diagnosis not present

## 2014-06-16 DIAGNOSIS — M4807 Spinal stenosis, lumbosacral region: Secondary | ICD-10-CM | POA: Diagnosis not present

## 2014-06-18 ENCOUNTER — Ambulatory Visit: Payer: Medicare Other | Admitting: Physician Assistant

## 2014-08-04 ENCOUNTER — Telehealth: Payer: Self-pay | Admitting: Physician Assistant

## 2014-08-04 DIAGNOSIS — M4807 Spinal stenosis, lumbosacral region: Secondary | ICD-10-CM | POA: Diagnosis not present

## 2014-08-04 NOTE — Telephone Encounter (Signed)
Patient has some questions about her amlodipine and besylate and the difference between them  [phone]

## 2014-08-05 NOTE — Telephone Encounter (Signed)
Called and spoke to pt and she had questions on what she should be taken I went over her medication list told her which ones she should be on, pt verbally agreed and will go by her medication list.

## 2014-08-07 ENCOUNTER — Ambulatory Visit
Admission: RE | Admit: 2014-08-07 | Discharge: 2014-08-07 | Disposition: A | Discharge status: Home / Self Care | Payer: Medicare Other | Source: Ambulatory Visit

## 2014-08-07 ENCOUNTER — Ambulatory Visit: Payer: Medicare Other

## 2014-08-07 DIAGNOSIS — Z1231 Encounter for screening mammogram for malignant neoplasm of breast: Secondary | ICD-10-CM | POA: Diagnosis not present

## 2014-08-10 ENCOUNTER — Other Ambulatory Visit: Payer: Self-pay | Admitting: Physician Assistant

## 2014-08-14 DIAGNOSIS — M4317 Spondylolisthesis, lumbosacral region: Secondary | ICD-10-CM | POA: Diagnosis not present

## 2014-08-14 DIAGNOSIS — M4807 Spinal stenosis, lumbosacral region: Secondary | ICD-10-CM | POA: Diagnosis not present

## 2014-08-14 DIAGNOSIS — M5441 Lumbago with sciatica, right side: Secondary | ICD-10-CM | POA: Diagnosis not present

## 2014-08-14 DIAGNOSIS — M5442 Lumbago with sciatica, left side: Secondary | ICD-10-CM | POA: Diagnosis not present

## 2014-09-07 ENCOUNTER — Other Ambulatory Visit: Payer: Self-pay | Admitting: Family Medicine

## 2014-09-07 DIAGNOSIS — G4700 Insomnia, unspecified: Secondary | ICD-10-CM

## 2014-09-07 NOTE — Telephone Encounter (Signed)
Need OK refill Zolpidem to mail order?  LRF 03/11/14 #90 + 1 to mail order.  LOV 05/27/14.

## 2014-09-07 NOTE — Telephone Encounter (Signed)
Approved #90+1 

## 2014-09-08 DIAGNOSIS — M4807 Spinal stenosis, lumbosacral region: Secondary | ICD-10-CM | POA: Diagnosis not present

## 2014-09-08 MED ORDER — ZOLPIDEM TARTRATE 10 MG PO TABS
10.0000 mg | ORAL_TABLET | Freq: Every evening | ORAL | Status: DC | PRN
Start: 2014-09-08 — End: 2015-03-12

## 2014-09-08 NOTE — Telephone Encounter (Signed)
rx printed and faxed to mail order

## 2014-09-11 ENCOUNTER — Other Ambulatory Visit: Payer: Self-pay | Admitting: Physician Assistant

## 2014-09-11 NOTE — Telephone Encounter (Signed)
Medication refilled per protocol. 

## 2014-09-17 ENCOUNTER — Other Ambulatory Visit: Payer: Self-pay | Admitting: Family Medicine

## 2014-09-17 NOTE — Telephone Encounter (Signed)
Approved #180+1

## 2014-09-17 NOTE — Telephone Encounter (Signed)
LRF 03/25/14 #180 + 1  LOV 05/27/14  OK refill?

## 2014-09-18 NOTE — Telephone Encounter (Signed)
Medication refilled per protocol. 

## 2014-11-03 DIAGNOSIS — M5442 Lumbago with sciatica, left side: Secondary | ICD-10-CM | POA: Diagnosis not present

## 2014-11-03 DIAGNOSIS — M5441 Lumbago with sciatica, right side: Secondary | ICD-10-CM | POA: Diagnosis not present

## 2014-11-03 DIAGNOSIS — M4807 Spinal stenosis, lumbosacral region: Secondary | ICD-10-CM | POA: Diagnosis not present

## 2014-11-03 DIAGNOSIS — M4317 Spondylolisthesis, lumbosacral region: Secondary | ICD-10-CM | POA: Diagnosis not present

## 2014-11-06 ENCOUNTER — Encounter: Payer: Self-pay | Admitting: Family Medicine

## 2014-11-06 ENCOUNTER — Other Ambulatory Visit: Payer: Self-pay | Admitting: Physician Assistant

## 2014-11-06 NOTE — Telephone Encounter (Signed)
Medication refill for one time only.  Patient needs to be seen.  Letter sent for patient to call and schedule 

## 2014-11-07 DIAGNOSIS — M4807 Spinal stenosis, lumbosacral region: Secondary | ICD-10-CM | POA: Diagnosis not present

## 2014-11-26 ENCOUNTER — Ambulatory Visit (INDEPENDENT_AMBULATORY_CARE_PROVIDER_SITE_OTHER): Payer: Medicare Other | Admitting: Family Medicine

## 2014-11-26 ENCOUNTER — Encounter: Payer: Self-pay | Admitting: Family Medicine

## 2014-11-26 ENCOUNTER — Ambulatory Visit: Payer: Self-pay | Admitting: Physician Assistant

## 2014-11-26 VITALS — BP 110/70 | HR 86 | Temp 98.6°F | Resp 18 | Ht 63.0 in | Wt 143.0 lb

## 2014-11-26 DIAGNOSIS — Z01818 Encounter for other preprocedural examination: Secondary | ICD-10-CM

## 2014-11-26 DIAGNOSIS — I10 Essential (primary) hypertension: Secondary | ICD-10-CM

## 2014-11-26 MED ORDER — HYDROCODONE-ACETAMINOPHEN 10-325 MG PO TABS: 1.0000 | ORAL_TABLET | Freq: Four times a day (QID) | ORAL | Status: DC | PRN

## 2014-11-26 NOTE — Progress Notes (Signed)
Subjective:    Patient ID: Audrey David, female    DOB: 1946/01/09, 69 y.o.   MRN: [id number]  HPI Patient is here for medical clearance prior to her upcoming lumbar fusion L3-5.  Patient usually sees [facility].  She denies any chest pain, doe, orthopnea, edema, or unstable angina.  Patient is having significant pain in her lower back and occasional numbness and tingling in her legs along with weakness in her legs. She is seeing Dr. Rolena Infante for the surgery which is planned tentatively for next week..  Patient seems miserable today. She is unable to get comfortable in any position. She is not currently taking any medication for pain  Past Medical History  Diagnosis Date  . Phlebitis   . Hemorrhoids   . Hypertension   . Insomnia   . Arthritis    Past Surgical History  Procedure Laterality Date  . Abdominal hysterectomy     Current Outpatient Prescriptions on File Prior to Visit  Medication Sig Dispense Refill  . docusate sodium (COLACE) 100 MG capsule Take 100 mg by mouth daily as needed for mild constipation.    Marland Kitchen lisinopril (PRINIVIL,ZESTRIL) 10 MG tablet TAKE 1 TABLET DAILY (NEED TO BE SEEN BEFORE ANY FURTHER REFILLS) 90 tablet 0  . meloxicam (MOBIC) 15 MG tablet TAKE 1 TABLET DAILY AS NEEDED 90 tablet 1  . tiZANidine (ZANAFLEX) 2 MG tablet TAKE 1 TABLET EVERY 6 HOURS AS NEEDED 180 tablet 1  . zolpidem (AMBIEN) 10 MG tablet Take 1 tablet (10 mg total) by mouth at bedtime as needed for sleep. Use as bedtime as directed 90 tablet 1   No current facility-administered medications on file prior to visit.   Allergies  Allergen Reactions  . Ace Inhibitors Cough   Social History   Social History  . Marital Status: Single    Spouse Name: N/A  . Number of Children: N/A  . Years of Education: N/A   Occupational History  . Not on file.   Social History Main Topics  . Smoking status: Former Research scientist (life sciences)  . Smokeless tobacco: Never Used  . Alcohol Use: Yes     Comment: Social     . Drug Use: No  . Sexual Activity: Not on file   Other Topics Concern  . Not on file   Social History Narrative   Daily caffeine    Family History  Problem Relation Age of Onset  . Hypertension Mother   . Cancer Father 26    multiple myeloma. Throat Cancer.      Review of Systems  Respiratory: Negative for apnea, cough, choking, chest tightness, shortness of breath and wheezing.   Cardiovascular: Negative for chest pain, palpitations and leg swelling.  All other systems reviewed and are negative.      Objective:   Physical Exam  Constitutional: She is oriented to person, place, and time. She appears well-developed and well-nourished. No distress.  Neck: No JVD present. No thyromegaly present.  Cardiovascular: Normal rate, regular rhythm and normal heart sounds.  Exam reveals no gallop and no friction rub.   No murmur heard. Pulmonary/Chest: Effort normal and breath sounds normal. No respiratory distress. She has no wheezes. She has no rales. She exhibits no tenderness.  Abdominal: Soft. Bowel sounds are normal. She exhibits no distension and no mass. There is no tenderness. There is no rebound and no guarding.  Musculoskeletal: She exhibits no edema.  Lymphadenopathy:    She has no cervical adenopathy.  Neurological: She is  alert and oriented to person, place, and time. She has normal reflexes. She displays normal reflexes. No cranial nerve deficit. She exhibits normal muscle tone. Coordination normal.  Skin: She is not diaphoretic.  Vitals reviewed.         Assessment & Plan:  Preoperative evaluation of a medical condition to rule out surgical contraindications (TAR required) - Plan: EKG 12-Lead  Essential hypertension - Plan: CBC with Differential/Platelet, COMPLETE METABOLIC PANEL WITH GFR  EKG shows normal sinus rhythm with normal intervals and a normal axis.  Does show occasional PVC's but no contraindications for surgery.  I will obtain a CBC as well as a  CMP. Barring any abnormalities on the patient's lab work I believe she is medically cleared to proceed with her upcoming lumbar fusion. I did give the patient Norco 10/325 one tablet every 6 hours as needed for pain. I gave the patient 30 tablets with 0 refills.

## 2014-11-27 ENCOUNTER — Other Ambulatory Visit: Payer: Self-pay | Admitting: Family Medicine

## 2014-11-27 DIAGNOSIS — M4807 Spinal stenosis, lumbosacral region: Secondary | ICD-10-CM | POA: Diagnosis not present

## 2014-11-27 DIAGNOSIS — E875 Hyperkalemia: Secondary | ICD-10-CM

## 2014-11-27 LAB — COMPLETE METABOLIC PANEL WITH GFR
ALT: 18 U/L (ref 6–29)
AST: 15 U/L (ref 10–35)
Albumin: 4.3 g/dL (ref 3.6–5.1)
Alkaline Phosphatase: 73 U/L (ref 33–130)
BUN: 22 mg/dL (ref 7–25)
CO2: 27 mmol/L (ref 20–31)
Calcium: 10.3 mg/dL (ref 8.6–10.4)
Chloride: 106 mmol/L (ref 98–110)
Creat: 1.04 mg/dL — ABNORMAL HIGH (ref 0.60–0.93)
GFR, Est African American: 63 mL/min (ref >=60)
GFR, Est Non African American: 55 mL/min — ABNORMAL LOW (ref >=60)
Glucose, Bld: 102 mg/dL — ABNORMAL HIGH (ref 70–99)
Potassium: 5.9 mmol/L — ABNORMAL HIGH (ref 3.5–5.3)
Sodium: 144 mmol/L (ref 135–146)
Total Bilirubin: 0.4 mg/dL (ref 0.2–1.2)
Total Protein: 7 g/dL (ref 6.1–8.1)

## 2014-11-27 LAB — CBC WITH DIFFERENTIAL/PLATELET
Basophils Absolute: 0.1 10*3/uL (ref 0.0–0.1)
Basophils Relative: 1 % (ref 0–1)
Eosinophils Absolute: 0.4 10*3/uL (ref 0.0–0.7)
Eosinophils Relative: 4 % (ref 0–5)
HCT: 36.8 % (ref 36.0–46.0)
Hemoglobin: 11.9 g/dL — ABNORMAL LOW (ref 12.0–15.0)
Lymphocytes Relative: 21 % (ref 12–46)
Lymphs Abs: 2.1 10*3/uL (ref 0.7–4.0)
MCH: 27.6 pg (ref 26.0–34.0)
MCHC: 32.3 g/dL (ref 30.0–36.0)
MCV: 85.4 fL (ref 78.0–100.0)
MPV: 10.5 fL (ref 8.6–12.4)
Monocytes Absolute: 1 10*3/uL (ref 0.1–1.0)
Monocytes Relative: 10 % (ref 3–12)
Neutro Abs: 6.3 10*3/uL (ref 1.7–7.7)
Neutrophils Relative %: 64 % (ref 43–77)
Platelets: 303 10*3/uL (ref 150–400)
RBC: 4.31 MIL/uL (ref 3.87–5.11)
RDW: 14 % (ref 11.5–15.5)
WBC: 9.8 10*3/uL (ref 4.0–10.5)

## 2014-11-30 ENCOUNTER — Other Ambulatory Visit: Payer: Medicare Other

## 2014-11-30 ENCOUNTER — Encounter (HOSPITAL_COMMUNITY)
Admission: RE | Admit: 2014-11-30 | Discharge: 2014-11-30 | Disposition: A | Discharge status: Home / Self Care | Payer: Medicare Other | Source: Ambulatory Visit | Attending: Orthopedic Surgery | Admitting: Orthopedic Surgery

## 2014-11-30 ENCOUNTER — Encounter (HOSPITAL_COMMUNITY): Payer: Self-pay

## 2014-11-30 DIAGNOSIS — E875 Hyperkalemia: Secondary | ICD-10-CM

## 2014-11-30 HISTORY — DX: Serous retinal detachment, unspecified eye: H33.20

## 2014-11-30 LAB — COMPREHENSIVE METABOLIC PANEL
ALT: 15 U/L (ref 6–29)
AST: 14 U/L (ref 10–35)
Albumin: 3.8 g/dL (ref 3.6–5.1)
Alkaline Phosphatase: 69 U/L (ref 33–130)
BUN: 26 mg/dL — ABNORMAL HIGH (ref 7–25)
CO2: 28 mmol/L (ref 20–31)
Calcium: 9.3 mg/dL (ref 8.6–10.4)
Chloride: 100 mmol/L (ref 98–110)
Creat: 1.25 mg/dL — ABNORMAL HIGH (ref 0.60–0.93)
Glucose, Bld: 133 mg/dL — ABNORMAL HIGH (ref 70–99)
Potassium: 4.5 mmol/L (ref 3.5–5.3)
Sodium: 137 mmol/L (ref 135–146)
Total Bilirubin: 0.3 mg/dL (ref 0.2–1.2)
Total Protein: 5.9 g/dL — ABNORMAL LOW (ref 6.1–8.1)

## 2014-11-30 LAB — SURGICAL PCR SCREEN
MRSA, PCR: NEGATIVE
Staphylococcus aureus: POSITIVE — AB

## 2014-11-30 LAB — ABO/RH: ABO/RH(D): O POS

## 2014-11-30 LAB — TYPE AND SCREEN
ABO/RH(D): O POS
Antibody Screen: NEGATIVE

## 2014-11-30 NOTE — Progress Notes (Signed)
Pt denies SOB, chest pain, and being under the care of a cardiologist. Pt denies having a cardiac cath and echo but stated that a stress test was done a couple of years ago "not sure when; "  records requested from [facility], [place], [place]. Pt chart forwarded to [place], [place], anesthesia, regarding order for consult,  medical clearance in epic and CMP( pending) due to recent elevated Potassium levels.

## 2014-11-30 NOTE — Pre-Procedure Instructions (Signed)
    Audrey David  11/30/2014      [facility] - [place], [place] - [address] [place] [address] Phone: [phone] Fax: [phone]  [facility] DELIVERY - [place], [place] - [address] [place] [address] Phone: [phone] Fax: [phone]  [facility] [address] [place], [place] - [address] [place] [address] Phone: [phone] Fax: [phone]  [facility] [address] [place], [place] - [address] AT [facility] [address] [facility] [address] [place] [address] Phone: [phone] Fax: [phone]    Your procedure is scheduled on Wednesday, December 02, 2014  Report to [facility] Admitting at 9:30 A.M.  Call this number if you have problems the morning of surgery:  [phone]   Remember:  Do not eat food or drink liquids after midnight Tuesday, December 01, 2014  Take these medicines the morning of surgery with A SIP OF WATER : if needed: HYDROcodone-acetaminophen [facility]) for pain Stop taking Aspirin, vitamins and herbal medications. Do not take any NSAIDs ie: Ibuprofen, Advil, Naproxen or any medication containing Aspirin such as meloxicam (MOBIC) stop now.  Do not wear jewelry, make-up or nail polish.  Do not wear lotions, powders, or perfumes.  You may not wear deodorant.  Do not shave 48 hours prior to surgery.   Do not bring valuables to the hospital.  [facility] is not responsible for any belongings or valuables.  Contacts, dentures or bridgework may not be worn into surgery.  Leave your suitcase in the car.  After surgery it may be brought to your room.  For patients admitted to the hospital, discharge time will be determined by your treatment team.  Patients discharged the day of surgery will not be allowed to drive home.   Name and phone number of your driver:  Special instructions: Shower  the night before surgery and the morning of surgery with CHG.  Please read over the following fact sheets that you were given. Pain Booklet, Coughing and Deep Breathing, Blood Transfusion Information, MRSA Information and Surgical Site Infection Prevention

## 2014-12-01 DIAGNOSIS — E875 Hyperkalemia: Secondary | ICD-10-CM | POA: Diagnosis not present

## 2014-12-01 MED ORDER — CEFAZOLIN SODIUM-DEXTROSE 2-3 GM-% IV SOLR
2.0000 g | INTRAVENOUS | Status: AC
  Administered 2014-12-02: 2 g via INTRAVENOUS
  Filled 2014-12-01: qty 50

## 2014-12-01 NOTE — Progress Notes (Signed)
Anesthesia Chart Review: Patient is a 69 year old female scheduled for L3-5 TLIF on 12/02/14 by Dr. Shon Baton.  History includes former smoker, HTN, insomnia, detached retina, hysterectomy. PCP is Dr. Lynnea Ferrier who medically cleared patient for this procedure following office visit with labs and EKG on 11/26/14.   11/26/14 EKG (done as part of her clearance and reviewed by Dr. Tanya Nones): SR, frequent ectopic ventricular beats (2). Possible LAE. Borderline LAD. Negative T wave in V1-2 (overall, I think anterior T wave abnormality is non-specific.)  Labs done at her PCP office (in Epic) on 11/26/14 noted. He had her hold lisinopril due to hyperkalemia (5.9). Repeat CMET on 11/30/14 showed her K down to 4.5.   Patient has medical clearance. Hyperkalemia improved. If no acute changes then I would anticipate that she could proceed as planned.  Velna Ochs [facility] Short Stay Center/Anesthesiology Phone [phone] 12/01/2014 10:39 AM

## 2014-12-02 ENCOUNTER — Inpatient Hospital Stay (HOSPITAL_COMMUNITY): Payer: Medicare Other | Admitting: Vascular Surgery

## 2014-12-02 ENCOUNTER — Inpatient Hospital Stay (HOSPITAL_COMMUNITY): Payer: Medicare Other | Admitting: Certified Registered Nurse Anesthetist

## 2014-12-02 ENCOUNTER — Encounter (HOSPITAL_COMMUNITY)
Admission: RE | Disposition: A | Discharge status: Home Health | Payer: Self-pay | Source: Ambulatory Visit | Attending: Orthopedic Surgery

## 2014-12-02 ENCOUNTER — Inpatient Hospital Stay (HOSPITAL_COMMUNITY)
Admission: RE | Admit: 2014-12-02 | Discharge: 2014-12-05 | DRG: 460 | Disposition: A | Discharge status: Home Health | Payer: Medicare Other | Source: Ambulatory Visit | Attending: Orthopedic Surgery | Admitting: Orthopedic Surgery

## 2014-12-02 ENCOUNTER — Inpatient Hospital Stay (HOSPITAL_COMMUNITY): Payer: Medicare Other

## 2014-12-02 DIAGNOSIS — I10 Essential (primary) hypertension: Secondary | ICD-10-CM | POA: Diagnosis present

## 2014-12-02 DIAGNOSIS — M5136 Other intervertebral disc degeneration, lumbar region: Secondary | ICD-10-CM | POA: Diagnosis not present

## 2014-12-02 DIAGNOSIS — G4700 Insomnia, unspecified: Secondary | ICD-10-CM | POA: Diagnosis present

## 2014-12-02 DIAGNOSIS — M5137 Other intervertebral disc degeneration, lumbosacral region: Secondary | ICD-10-CM | POA: Diagnosis present

## 2014-12-02 DIAGNOSIS — M1990 Unspecified osteoarthritis, unspecified site: Secondary | ICD-10-CM | POA: Diagnosis not present

## 2014-12-02 DIAGNOSIS — Z9889 Other specified postprocedural states: Secondary | ICD-10-CM

## 2014-12-02 DIAGNOSIS — R252 Cramp and spasm: Secondary | ICD-10-CM | POA: Diagnosis present

## 2014-12-02 DIAGNOSIS — M549 Dorsalgia, unspecified: Secondary | ICD-10-CM | POA: Diagnosis present

## 2014-12-02 DIAGNOSIS — M4317 Spondylolisthesis, lumbosacral region: Secondary | ICD-10-CM | POA: Diagnosis not present

## 2014-12-02 DIAGNOSIS — M4326 Fusion of spine, lumbar region: Secondary | ICD-10-CM | POA: Diagnosis not present

## 2014-12-02 DIAGNOSIS — E875 Hyperkalemia: Secondary | ICD-10-CM | POA: Diagnosis not present

## 2014-12-02 DIAGNOSIS — M4806 Spinal stenosis, lumbar region: Secondary | ICD-10-CM | POA: Diagnosis present

## 2014-12-02 DIAGNOSIS — M4316 Spondylolisthesis, lumbar region: Secondary | ICD-10-CM | POA: Diagnosis not present

## 2014-12-02 DIAGNOSIS — Z419 Encounter for procedure for purposes other than remedying health state, unspecified: Secondary | ICD-10-CM

## 2014-12-02 DIAGNOSIS — Z87891 Personal history of nicotine dependence: Secondary | ICD-10-CM

## 2014-12-02 SURGERY — POSTERIOR LUMBAR FUSION 2 LEVEL
Anesthesia: General | Site: Spine Lumbar

## 2014-12-02 MED ORDER — THROMBIN 20000 UNITS EX SOLR
CUTANEOUS | Status: DC | PRN
  Administered 2014-12-02: 20 mL via TOPICAL

## 2014-12-02 MED ORDER — MAGNESIUM CITRATE PO SOLN
0.5000 | Freq: Once | ORAL | Status: AC
  Administered 2014-12-02: 0.5 via ORAL
  Filled 2014-12-02: qty 296

## 2014-12-02 MED ORDER — HYDROMORPHONE HCL 1 MG/ML IJ SOLN
INTRAMUSCULAR | Status: AC
  Administered 2014-12-02: 0.5 mg via INTRAVENOUS
  Filled 2014-12-02: qty 1

## 2014-12-02 MED ORDER — PROPOFOL 10 MG/ML IV BOLUS
INTRAVENOUS | Status: AC
  Filled 2014-12-02: qty 20

## 2014-12-02 MED ORDER — PROMETHAZINE HCL 25 MG/ML IJ SOLN: 6.2500 mg | INTRAMUSCULAR | Status: DC | PRN

## 2014-12-02 MED ORDER — HYDROMORPHONE HCL 1 MG/ML IJ SOLN
0.2500 mg | INTRAMUSCULAR | Status: DC | PRN
  Administered 2014-12-02 (×4): 0.5 mg via INTRAVENOUS

## 2014-12-02 MED ORDER — LIDOCAINE HCL (CARDIAC) 20 MG/ML IV SOLN
INTRAVENOUS | Status: AC
  Filled 2014-12-02: qty 5

## 2014-12-02 MED ORDER — OXYCODONE HCL 5 MG PO TABS
10.0000 mg | ORAL_TABLET | ORAL | Status: DC | PRN
  Administered 2014-12-02 – 2014-12-05 (×13): 10 mg via ORAL
  Filled 2014-12-02 (×13): qty 2

## 2014-12-02 MED ORDER — ARTIFICIAL TEARS OP OINT
TOPICAL_OINTMENT | OPHTHALMIC | Status: DC | PRN
  Administered 2014-12-02: 1 via OPHTHALMIC

## 2014-12-02 MED ORDER — OXYCODONE HCL 5 MG/5ML PO SOLN: 5.0000 mg | Freq: Once | ORAL | Status: AC | PRN

## 2014-12-02 MED ORDER — EPHEDRINE SULFATE 50 MG/ML IJ SOLN
INTRAMUSCULAR | Status: DC | PRN
  Administered 2014-12-02: 5 mg via INTRAVENOUS
  Administered 2014-12-02: 15 mg via INTRAVENOUS
  Administered 2014-12-02 (×2): 5 mg via INTRAVENOUS
  Administered 2014-12-02: 10 mg via INTRAVENOUS

## 2014-12-02 MED ORDER — LIDOCAINE HCL (CARDIAC) 20 MG/ML IV SOLN
INTRAVENOUS | Status: DC | PRN
  Administered 2014-12-02: 60 mg via INTRAVENOUS

## 2014-12-02 MED ORDER — HEMOSTATIC AGENTS (NO CHARGE) OPTIME
TOPICAL | Status: DC | PRN
  Administered 2014-12-02: 1 via TOPICAL

## 2014-12-02 MED ORDER — MIDAZOLAM HCL 2 MG/2ML IJ SOLN
INTRAMUSCULAR | Status: AC
  Administered 2014-12-02: 1 mg
  Filled 2014-12-02: qty 2

## 2014-12-02 MED ORDER — PHENOL 1.4 % MT LIQD: 1.0000 | OROMUCOSAL | Status: DC | PRN

## 2014-12-02 MED ORDER — METHOCARBAMOL 500 MG PO TABS
ORAL_TABLET | ORAL | Status: AC
  Administered 2014-12-02: 500 mg via ORAL
  Filled 2014-12-02: qty 1

## 2014-12-02 MED ORDER — OXYCODONE HCL 5 MG PO TABS
ORAL_TABLET | ORAL | Status: AC
  Filled 2014-12-02: qty 1

## 2014-12-02 MED ORDER — METHOCARBAMOL 1000 MG/10ML IJ SOLN
500.0000 mg | Freq: Four times a day (QID) | INTRAMUSCULAR | Status: DC | PRN
  Filled 2014-12-02: qty 5

## 2014-12-02 MED ORDER — ONDANSETRON HCL 4 MG/2ML IJ SOLN
INTRAMUSCULAR | Status: AC
  Filled 2014-12-02: qty 2

## 2014-12-02 MED ORDER — PROPOFOL 10 MG/ML IV BOLUS
INTRAVENOUS | Status: DC | PRN
  Administered 2014-12-02: 200 mg via INTRAVENOUS

## 2014-12-02 MED ORDER — LACTATED RINGERS IV SOLN: INTRAVENOUS | Status: DC

## 2014-12-02 MED ORDER — ROCURONIUM BROMIDE 50 MG/5ML IV SOLN
INTRAVENOUS | Status: AC
  Filled 2014-12-02: qty 1

## 2014-12-02 MED ORDER — BUPIVACAINE-EPINEPHRINE (PF) 0.25% -1:200000 IJ SOLN
INTRAMUSCULAR | Status: AC
  Filled 2014-12-02: qty 30

## 2014-12-02 MED ORDER — MENTHOL 3 MG MT LOZG: 1.0000 | LOZENGE | OROMUCOSAL | Status: DC | PRN

## 2014-12-02 MED ORDER — LACTATED RINGERS IV SOLN
INTRAVENOUS | Status: DC
  Administered 2014-12-02 (×2): via INTRAVENOUS

## 2014-12-02 MED ORDER — OXYCODONE-ACETAMINOPHEN 10-325 MG PO TABS: 1.0000 | ORAL_TABLET | ORAL | Status: DC | PRN

## 2014-12-02 MED ORDER — THROMBIN 20000 UNITS EX SOLR
CUTANEOUS | Status: AC
  Filled 2014-12-02: qty 20000

## 2014-12-02 MED ORDER — METHOCARBAMOL 500 MG PO TABS
500.0000 mg | ORAL_TABLET | Freq: Four times a day (QID) | ORAL | Status: DC | PRN
  Administered 2014-12-02 – 2014-12-05 (×8): 500 mg via ORAL
  Filled 2014-12-02 (×9): qty 1

## 2014-12-02 MED ORDER — SODIUM CHLORIDE 0.9 % IV SOLN: 250.0000 mL | INTRAVENOUS | Status: DC

## 2014-12-02 MED ORDER — FENTANYL CITRATE (PF) 250 MCG/5ML IJ SOLN
INTRAMUSCULAR | Status: AC
  Filled 2014-12-02: qty 5

## 2014-12-02 MED ORDER — SUCCINYLCHOLINE CHLORIDE 20 MG/ML IJ SOLN
INTRAMUSCULAR | Status: AC
  Filled 2014-12-02: qty 1

## 2014-12-02 MED ORDER — ACETAMINOPHEN 10 MG/ML IV SOLN
INTRAVENOUS | Status: AC
  Filled 2014-12-02: qty 100

## 2014-12-02 MED ORDER — ALBUMIN HUMAN 5 % IV SOLN
INTRAVENOUS | Status: DC | PRN
Start: 2014-12-02 — End: 2014-12-02
  Administered 2014-12-02: 14:00:00 via INTRAVENOUS

## 2014-12-02 MED ORDER — DEXAMETHASONE SODIUM PHOSPHATE 10 MG/ML IJ SOLN
INTRAMUSCULAR | Status: DC | PRN
  Administered 2014-12-02: 10 mg via INTRAVENOUS

## 2014-12-02 MED ORDER — SODIUM CHLORIDE 0.9 % IJ SOLN
INTRAMUSCULAR | Status: AC
  Filled 2014-12-02: qty 10

## 2014-12-02 MED ORDER — SODIUM CHLORIDE 0.9 % IJ SOLN: 3.0000 mL | Freq: Two times a day (BID) | INTRAMUSCULAR | Status: DC

## 2014-12-02 MED ORDER — PHENYLEPHRINE HCL 10 MG/ML IJ SOLN
INTRAMUSCULAR | Status: DC | PRN
  Administered 2014-12-02 (×4): 80 ug via INTRAVENOUS

## 2014-12-02 MED ORDER — METHOCARBAMOL 500 MG PO TABS: 500.0000 mg | ORAL_TABLET | Freq: Three times a day (TID) | ORAL | Status: DC | PRN

## 2014-12-02 MED ORDER — PHENYLEPHRINE 40 MCG/ML (10ML) SYRINGE FOR IV PUSH (FOR BLOOD PRESSURE SUPPORT)
PREFILLED_SYRINGE | INTRAVENOUS | Status: AC
  Filled 2014-12-02: qty 10

## 2014-12-02 MED ORDER — DEXAMETHASONE 4 MG PO TABS
4.0000 mg | ORAL_TABLET | Freq: Four times a day (QID) | ORAL | Status: AC
  Administered 2014-12-02 – 2014-12-03 (×3): 4 mg via ORAL
  Filled 2014-12-02 (×3): qty 1

## 2014-12-02 MED ORDER — SODIUM CHLORIDE 0.9 % IJ SOLN: 3.0000 mL | INTRAMUSCULAR | Status: DC | PRN

## 2014-12-02 MED ORDER — ONDANSETRON HCL 4 MG/2ML IJ SOLN: 4.0000 mg | INTRAMUSCULAR | Status: DC | PRN

## 2014-12-02 MED ORDER — FLEET ENEMA 7-19 GM/118ML RE ENEM: 1.0000 | ENEMA | Freq: Once | RECTAL | Status: DC

## 2014-12-02 MED ORDER — MIDAZOLAM HCL 2 MG/2ML IJ SOLN: 1.0000 mg | INTRAMUSCULAR | Status: DC | PRN

## 2014-12-02 MED ORDER — MORPHINE SULFATE (PF) 2 MG/ML IV SOLN
1.0000 mg | INTRAVENOUS | Status: DC | PRN
  Administered 2014-12-02: 2 mg via INTRAVENOUS
  Filled 2014-12-02: qty 1

## 2014-12-02 MED ORDER — ACETAMINOPHEN 10 MG/ML IV SOLN
INTRAVENOUS | Status: DC | PRN
  Administered 2014-12-02: 1000 mg via INTRAVENOUS

## 2014-12-02 MED ORDER — ONDANSETRON HCL 4 MG PO TABS: 4.0000 mg | ORAL_TABLET | Freq: Three times a day (TID) | ORAL | Status: DC | PRN

## 2014-12-02 MED ORDER — 0.9 % SODIUM CHLORIDE (POUR BTL) OPTIME
TOPICAL | Status: DC | PRN
  Administered 2014-12-02: 1000 mL

## 2014-12-02 MED ORDER — FENTANYL CITRATE (PF) 100 MCG/2ML IJ SOLN
INTRAMUSCULAR | Status: DC | PRN
  Administered 2014-12-02: 100 ug via INTRAVENOUS
  Administered 2014-12-02: 50 ug via INTRAVENOUS
  Administered 2014-12-02: 100 ug via INTRAVENOUS
  Administered 2014-12-02 (×2): 50 ug via INTRAVENOUS

## 2014-12-02 MED ORDER — DOCUSATE SODIUM 100 MG PO CAPS: 100.0000 mg | ORAL_CAPSULE | Freq: Three times a day (TID) | ORAL | Status: DC | PRN

## 2014-12-02 MED ORDER — OXYCODONE HCL 5 MG PO TABS
5.0000 mg | ORAL_TABLET | Freq: Once | ORAL | Status: AC | PRN
  Administered 2014-12-02: 5 mg via ORAL

## 2014-12-02 MED ORDER — EPHEDRINE SULFATE 50 MG/ML IJ SOLN
INTRAMUSCULAR | Status: AC
  Filled 2014-12-02: qty 1

## 2014-12-02 MED ORDER — CEFAZOLIN SODIUM-DEXTROSE 2-3 GM-% IV SOLR
INTRAVENOUS | Status: AC
  Filled 2014-12-02: qty 50

## 2014-12-02 MED ORDER — DEXAMETHASONE SODIUM PHOSPHATE 4 MG/ML IJ SOLN: 4.0000 mg | Freq: Four times a day (QID) | INTRAMUSCULAR | Status: AC

## 2014-12-02 MED ORDER — MIDAZOLAM HCL 2 MG/2ML IJ SOLN
INTRAMUSCULAR | Status: AC
  Filled 2014-12-02: qty 4

## 2014-12-02 MED ORDER — CEFAZOLIN SODIUM 1-5 GM-% IV SOLN
1.0000 g | Freq: Three times a day (TID) | INTRAVENOUS | Status: AC
  Administered 2014-12-02 – 2014-12-03 (×2): 1 g via INTRAVENOUS
  Filled 2014-12-02 (×2): qty 50

## 2014-12-02 MED ORDER — LISINOPRIL 20 MG PO TABS
10.0000 mg | ORAL_TABLET | Freq: Every day | ORAL | Status: DC
  Administered 2014-12-02 – 2014-12-03 (×2): 10 mg via ORAL
  Filled 2014-12-02 (×2): qty 1

## 2014-12-02 MED ORDER — ROCURONIUM BROMIDE 100 MG/10ML IV SOLN
INTRAVENOUS | Status: DC | PRN
  Administered 2014-12-02: 5 mg via INTRAVENOUS

## 2014-12-02 MED ORDER — SUCCINYLCHOLINE CHLORIDE 20 MG/ML IJ SOLN
INTRAMUSCULAR | Status: DC | PRN
  Administered 2014-12-02: 100 mg via INTRAVENOUS

## 2014-12-02 MED ORDER — BUPIVACAINE-EPINEPHRINE 0.25% -1:200000 IJ SOLN
INTRAMUSCULAR | Status: DC | PRN
  Administered 2014-12-02: 20 mL

## 2014-12-02 MED ORDER — PROPOFOL 500 MG/50ML IV EMUL
INTRAVENOUS | Status: DC | PRN
  Administered 2014-12-02: 35 ug/kg/min via INTRAVENOUS

## 2014-12-02 MED ORDER — ONDANSETRON HCL 4 MG/2ML IJ SOLN
INTRAMUSCULAR | Status: DC | PRN
  Administered 2014-12-02: 4 mg via INTRAVENOUS

## 2014-12-02 SURGICAL SUPPLY — 73 items
BLADE SURG ROTATE 9660 (MISCELLANEOUS) IMPLANT
BUR EGG ELITE 4.0 (BURR) IMPLANT
CAGE TLIF XLRG 9 LUMBAR (Cage) ×4 IMPLANT
CLIP NEUROVISION LG (CLIP) ×2 IMPLANT
CLSR STERI-STRIP ANTIMIC 1/2X4 (GAUZE/BANDAGES/DRESSINGS) ×6 IMPLANT
COVER MAYO STAND STRL (DRAPES) IMPLANT
COVER SURGICAL LIGHT HANDLE (MISCELLANEOUS) ×2 IMPLANT
DRAPE C-ARM 42X72 X-RAY (DRAPES) ×2 IMPLANT
DRAPE C-ARMOR (DRAPES) ×2 IMPLANT
DRAPE POUCH INSTRU U-SHP 10X18 (DRAPES) ×2 IMPLANT
DRAPE SURG 17X23 STRL (DRAPES) ×2 IMPLANT
DRAPE U-SHAPE 47X51 STRL (DRAPES) ×2 IMPLANT
DRSG MEPILEX BORDER 4X8 (GAUZE/BANDAGES/DRESSINGS) ×6 IMPLANT
DURAPREP 26ML APPLICATOR (WOUND CARE) ×2 IMPLANT
ELECT BLADE 4.0 EZ CLEAN MEGAD (MISCELLANEOUS)
ELECT BLADE 6.5 EXT (BLADE) ×2 IMPLANT
ELECT CAUTERY BLADE 6.4 (BLADE) ×2 IMPLANT
ELECT PENCIL ROCKER SW 15FT (MISCELLANEOUS) ×2 IMPLANT
ELECT REM PT RETURN 9FT ADLT (ELECTROSURGICAL) ×2
ELECTRODE BLDE 4.0 EZ CLN MEGD (MISCELLANEOUS) IMPLANT
ELECTRODE REM PT RTRN 9FT ADLT (ELECTROSURGICAL) ×1 IMPLANT
GLOVE BIOGEL PI IND STRL 8.5 (GLOVE) ×1 IMPLANT
GLOVE BIOGEL PI INDICATOR 8.5 (GLOVE) ×1
GLOVE SS BIOGEL STRL SZ 8.5 (GLOVE) ×2 IMPLANT
GLOVE SUPERSENSE BIOGEL SZ 8.5 (GLOVE) ×2
GOWN STRL REUS W/ TWL LRG LVL3 (GOWN DISPOSABLE) ×1 IMPLANT
GOWN STRL REUS W/TWL 2XL LVL3 (GOWN DISPOSABLE) ×4 IMPLANT
GOWN STRL REUS W/TWL LRG LVL3 (GOWN DISPOSABLE) ×1
GUIDEWIRE NITINOL BEVEL TIP (WIRE) ×12 IMPLANT
KIT BASIN OR (CUSTOM PROCEDURE TRAY) ×2 IMPLANT
KIT NEEDLE NVM5 EMG ELECT (KITS) ×1 IMPLANT
KIT NEEDLE NVM5 EMG ELECTRODE (KITS) ×1
KIT POSITION SURG JACKSON T1 (MISCELLANEOUS) ×2 IMPLANT
KIT ROOM TURNOVER OR (KITS) ×2 IMPLANT
LIGHT SOURCE ANGLE TIP STR 7FT (MISCELLANEOUS) ×2 IMPLANT
NEEDLE 22X1 1/2 (OR ONLY) (NEEDLE) ×2 IMPLANT
NEEDLE I-PASS III (NEEDLE) ×2 IMPLANT
NEEDLE SPNL 18GX3.5 QUINCKE PK (NEEDLE) ×2 IMPLANT
NEEDLE SSEP/EMG (NEEDLE) ×2 IMPLANT
NS IRRIG 1000ML POUR BTL (IV SOLUTION) ×2 IMPLANT
PACK LAMINECTOMY ORTHO (CUSTOM PROCEDURE TRAY) ×2 IMPLANT
PACK UNIVERSAL I (CUSTOM PROCEDURE TRAY) ×2 IMPLANT
PAD ARMBOARD 7.5X6 YLW CONV (MISCELLANEOUS) ×4 IMPLANT
PATTIES SURGICAL .5 X.5 (GAUZE/BANDAGES/DRESSINGS) IMPLANT
PATTIES SURGICAL .5 X1 (DISPOSABLE) ×4 IMPLANT
POSITIONER HEAD PRONE TRACH (MISCELLANEOUS) ×2 IMPLANT
PUTTY BONE DBX 5CC MIX (Putty) ×2 IMPLANT
ROD RELINE TI MAS LD 5.5X65 (Rod) ×4 IMPLANT
SCREW LOCK RELINE 5.5 TULIP (Screw) ×14 IMPLANT
SCREW RELINE MAS POLY 6.5X40MM (Screw) ×4 IMPLANT
SCREW RELINE RED 6.5X45MM POLY (Screw) ×2 IMPLANT
SCREW SHANK MAS MOD 6.5X40MM (Screw) ×4 IMPLANT
SCREW SHANK RELINE 6.5X45MM 2C (Screw) ×2 IMPLANT
SHEET CONFORM 45LX20WX5H (Bone Implant) ×2 IMPLANT
SPINE TULIP RELINE MOD (Neuro Prosthesis/Implant) ×6 IMPLANT
SPONGE LAP 4X18 X RAY DECT (DISPOSABLE) ×4 IMPLANT
SPONGE SURGIFOAM ABS GEL 100 (HEMOSTASIS) ×2 IMPLANT
SURGIFLO W/THROMBIN 8M KIT (HEMOSTASIS) ×2 IMPLANT
SUT BONE WAX W31G (SUTURE) ×2 IMPLANT
SUT MNCRL AB 3-0 PS2 18 (SUTURE) ×4 IMPLANT
SUT VIC AB 1 CT1 18XCR BRD 8 (SUTURE) ×1 IMPLANT
SUT VIC AB 1 CT1 8-18 (SUTURE) ×1
SUT VIC AB 1 CTX 36 (SUTURE) ×2
SUT VIC AB 1 CTX36XBRD ANBCTR (SUTURE) ×2 IMPLANT
SUT VIC AB 2-0 CT1 18 (SUTURE) ×2 IMPLANT
SYR BULB IRRIGATION 50ML (SYRINGE) ×2 IMPLANT
SYR CONTROL 10ML LL (SYRINGE) ×2 IMPLANT
TOWEL OR 17X24 6PK STRL BLUE (TOWEL DISPOSABLE) ×2 IMPLANT
TOWEL OR 17X26 10 PK STRL BLUE (TOWEL DISPOSABLE) ×2 IMPLANT
TRAY FOLEY CATH 16FRSI W/METER (SET/KITS/TRAYS/PACK) ×2 IMPLANT
TULIP SPINE RELINE MOD (Neuro Prosthesis/Implant) ×3 IMPLANT
WATER STERILE IRR 1000ML POUR (IV SOLUTION) ×2 IMPLANT
YANKAUER SUCT BULB TIP NO VENT (SUCTIONS) ×2 IMPLANT

## 2014-12-02 NOTE — Anesthesia Preprocedure Evaluation (Addendum)
Anesthesia Evaluation  Patient identified by MRN, date of birth, ID band Patient awake    Reviewed: Allergy & Precautions, NPO status , Patient's Chart, lab work & pertinent test results  Airway Mallampati: II  TM Distance: >3 FB Neck ROM: Full    Dental  (+) Dental Advisory Given   Pulmonary former smoker,    breath sounds clear to auscultation       Cardiovascular hypertension, Pt. on medications  Rhythm:Regular Rate:Normal     Neuro/Psych negative neurological ROS     GI/Hepatic negative GI ROS, Neg liver ROS,   Endo/Other  negative endocrine ROS  Renal/GU Renal InsufficiencyRenal disease     Musculoskeletal  (+) Arthritis ,   Abdominal   Peds  Hematology  (+) anemia ,   Anesthesia Other Findings   Reproductive/Obstetrics                            Lab Results  Component Value Date   WBC 9.8 11/26/2014   HGB 11.9* 11/26/2014   HCT 36.8 11/26/2014   MCV 85.4 11/26/2014   PLT 303 11/26/2014   Lab Results  Component Value Date   CREATININE 1.25* 11/30/2014   BUN 26* 11/30/2014   NA 137 11/30/2014   K 4.5 11/30/2014   CL 100 11/30/2014   CO2 28 11/30/2014    Anesthesia Physical Anesthesia Plan  ASA: II  Anesthesia Plan: General   Post-op Pain Management:    Induction: Intravenous  Airway Management Planned: Oral ETT  Additional Equipment:   Intra-op Plan:   Post-operative Plan: Extubation in OR  Informed Consent: I have reviewed the patients History and Physical, chart, labs and discussed the procedure including the risks, benefits and alternatives for the proposed anesthesia with the patient or authorized representative who has indicated his/her understanding and acceptance.   Dental advisory given  Plan Discussed with: CRNA  Anesthesia Plan Comments:         Anesthesia Quick Evaluation

## 2014-12-02 NOTE — Progress Notes (Signed)
Pt expressing anxiety about surgery.  Palms sweaty. BP 137/80...states that is higher than she usually runs.  Dr Sampson GoonFitzgerald notified and order received and 1 mg Versed given.  Pt states she is feeling somewhat better.  I will hold off any more versed for now as BP 100/46 at this time. I will reassess  need for more as the morning progresses.

## 2014-12-02 NOTE — Brief Op Note (Signed)
12/02/2014  4:51 PM  PATIENT:  Jerelyn ScottMaureen Grosvenor  69 y.o. 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  PROCEDURE:  Procedure(s): TLIF L3-5 (N/A)  SURGEON:  Surgeon(s) and Role:    * Venita Lickahari Kaulana Brindle, MD - Primary  PHYSICIAN ASSISTANT:   ASSISTANTS: Carmen Mayo   ANESTHESIA:   general  EBL:  Total I/O In: 1100 [I.V.:600; IV Piggyback:500] Out: 1195 [Urine:195; Blood:1000]  BLOOD ADMINISTERED:none  DRAINS: none   LOCAL MEDICATIONS USED:  MARCAINE     SPECIMEN:  No Specimen  DISPOSITION OF SPECIMEN:  N/A  COUNTS:  YES  TOURNIQUET:  * No tourniquets in log *  DICTATION: .Other Dictation: Dictation Number [id number]  PLAN OF CARE: Admit for overnight observation  PATIENT DISPOSITION:  PACU - hemodynamically stable.

## 2014-12-02 NOTE — Anesthesia Postprocedure Evaluation (Signed)
  Anesthesia Post-op Note  Patient: Jerelyn ScottMaureen Oshima  Procedure(s) Performed: Procedure(s): TLIF L3-5 (N/A)  Patient Location: PACU  Anesthesia Type:General  Level of Consciousness: awake  Airway and Oxygen Therapy: Patient Spontanous Breathing  Post-op Pain: mild  Post-op Assessment: Post-op Vital signs reviewed LLE Motor Response: Purposeful movement, Responds to commands LLE Sensation: No numbness RLE Motor Response: Purposeful movement, Responds to commands RLE Sensation: No numbness      Post-op Vital Signs: Reviewed  Last Vitals:  Filed Vitals:   12/02/14 1745  BP:   Pulse: 88  Temp:   Resp: 11    Complications: No apparent anesthesia complications

## 2014-12-02 NOTE — H&P (Signed)
History of Present Illness The patient is a 69 year old female who comes in today for a preoperative History and Physical. The patient is scheduled for a TLIF L 3-5 to be performed by Dr. Debria Garret D. Shon Baton, MD at [facility] on 12-02-14 . Please see the hospital record for complete dictated history and physical. The pt reports severe LBP and b/l leg pain.  Allergies  CODEINE05/10/2009 as a child, not 100% sure  Social History Children 3 Current drinker 08/29/2013: Currently drinks only occasionally per week Current work status retired Exercise Exercises never; does other Living situation live alone Marital status divorced Number of flights of stairs before winded 2-3 Tobacco / smoke exposure 08/29/2013: no Tobacco use Former smoker. 08/29/2013: smoke(d) less than 1/2 pack(s) per day  Medication History  Medications Reconciled Norco (5-325MG  Tablet, 1 (one) Tablet Oral two times daily, as needed, Taken starting 10/19/2014) Active. Meloxicam (  Tablet, Oral) Active. TraMADol HCl (  Tablet, Oral) Active. TiZANidine HCl (  Tablet, Oral as needed) Active. (for leg cramps) Zolpidem Tartrate (  Tablet, Oral) Active. Lisinopril (  Tablet, Oral) Active.  Vitals 11/27/2014 2:17 PM Weight: 143 lb Height: 63.5in Body Surface Area: 1.69 m Body Mass Index: 24.93 kg/m  Temp.: 98.62F(Oral)  Pulse: 72 (Regular)  BP: 117/76 (Sitting, Left Arm, Standard)  Physical Exam General General Appearance-Not in acute distress. Orientation-Oriented X3. Build & Nutrition-Well nourished and Well developed.  Integumentary General Characteristics Surgical Scars - no surgical scar evidence of previous lumbar surgery. Lumbar Spine-Skin examination of the lumbar spine is without deformity, skin lesions, lacerations or abrasions.  Chest and Lung Exam Auscultation Breath sounds - Normal and Clear.  Cardiovascular Auscultation Rhythm - Regular  rate and rhythm.  Abdomen Palpation/Percussion Palpation and Percussion of the abdomen reveal - Soft, Non Tender and No Rebound tenderness.  Peripheral Vascular Lower Extremity Palpation - Posterior tibial pulse - Bilateral - 2+. Dorsalis pedis pulse - Bilateral - 2+.  Neurologic Sensation Lower Extremity - Bilateral - sensation is intact in the lower extremity. Reflexes Patellar Reflex - Bilateral - 1+. Achilles Reflex - Bilateral - 1+. Clonus - Bilateral - clonus not present. Hoffman's Sign - Bilateral - Hoffman's sign not present. Testing Seated Straight Leg Raise - Bilateral - Seated straight leg raise negative.  Lumbosacral Spine: Inspection and Palpation - Tenderness - left lumbar paraspinals tender to palpation and right lumbar paraspinals tender to palpation. Strength and Tone: Strength - Hip Flexion - Bilateral - 5/5. Knee Extension - Bilateral - 5/5. Knee Flexion - Bilateral - 5/5. Ankle Dorsiflexion - Bilateral - 5/5. Ankle Plantarflexion - Bilateral - 5/5. Heel walk - Bilateral - able to heel walk with moderate difficulty. Note: increased pain. Toe Walk - Bilateral - able to walk on toes with moderate difficulty. Note: increased pain. Heel-Toe Walk - Bilateral - able to heel-toe walk without difficulty. ROM - Flexion - mildly decreased range of motion and painful. Extension - mildly decreased range of motion and painful. Left Lateral Bending - mildly decreased range of motion and painful. Right Lateral Bending - mildly decreased range of motion and painful. Pain - neither flexion or extension is more painful than the other. Lumbosacral Spine - Waddell's Signs - no Waddell's signs present. Lower Extremity Range of Motion - No true hip, knee or ankle pain with range of motion. Gait and Station - [employer] - no assistive devices.  Plan: She returns today for followup, She still has terrific back, buttock, bilateral neuropathic pain down to the level of the  foot. No motor  deficit. She has sensory change and dysesthesias predominantly in the 4 and 5 nerve distributions. She states there are episodes where her back will lock on her and she just falls to the floor and she cannot get up. No loss in bowel or bladder control, no shortness of breath, or chest pain.  At this point in time, her new x-rays continue to show degenerative grade I spondylolisthesis, L3-4, L4-5 with mild degenerative disc disease at L5-S1. Her MRI from 2015 and 2016 was re-reviewed. At this point, I think she is having two-level spondylitic radicular leg pain due to the slip in neural compression. We had a talk about a decompression fusion. We reviewed the risks. Both patient and her son were present for the dictation. She said that she would like to proceed with surgery, will get preoperative clearance from her primary care physician.  Plan is L3-5 TLIF which will allow direct neural decompression and stabilization.    Goal Of Surgery:Discussed that goal of surgery is to reduce pain and improve function and quality of life. Patient is aware that despite all appropriate treatment that there pain and function could be the same, worse, or different. Posterior Lumbar Decompression/disectomy: Risks of surgery include infection, bleeding, nerve damage, death, stroke, paralysis, failure to heal, need for further surgery, ongoing or worse pain, need for further surgery, CSF leak, loss of bowel or bladder, and recurrent disc herniation or Stenosis which would necessitate need for further surgery.

## 2014-12-02 NOTE — Anesthesia Procedure Notes (Signed)
Procedure Name: Intubation Date/Time: 12/02/2014 11:52 AM Performed by: Orvilla FusATO, Timmie Dugue A Pre-anesthesia Checklist: Patient identified, Timeout performed, Emergency Drugs available, Suction available and Patient being monitored Patient Re-evaluated:Patient Re-evaluated prior to inductionOxygen Delivery Method: Circle system utilized Preoxygenation: Pre-oxygenation with 100% oxygen Intubation Type: IV induction Ventilation: Mask ventilation without difficulty Laryngoscope Size: Mac and 3 Grade View: Grade I Tube type: Oral Tube size: 7.0 mm Number of attempts: 1 Airway Equipment and Method: Stylet Placement Confirmation: ETT inserted through vocal cords under direct vision,  breath sounds checked- equal and bilateral and positive ETCO2 Secured at: 21 cm Tube secured with: Tape Dental Injury: Teeth and Oropharynx as per pre-operative assessment

## 2014-12-02 NOTE — Transfer of Care (Signed)
Immediate Anesthesia Transfer of Care Note  Patient: Audrey David  Procedure(s) Performed: Procedure(s): TLIF L3-5 (N/A)  Patient Location: PACU  Anesthesia Type:General  Level of Consciousness: awake, alert  and patient cooperative  Airway & Oxygen Therapy: Patient Spontanous Breathing and Patient connected to nasal cannula oxygen  Post-op Assessment: Report given to RN, Post -op Vital signs reviewed and stable and Patient moving all extremities X 4  Post vital signs: Reviewed and stable  Last Vitals:  Filed Vitals:   12/02/14 0901  BP:   Pulse:   Temp: 36.5 C  Resp:     Complications: No apparent anesthesia complications

## 2014-12-03 ENCOUNTER — Observation Stay (HOSPITAL_COMMUNITY): Payer: Medicare Other

## 2014-12-03 DIAGNOSIS — M4326 Fusion of spine, lumbar region: Secondary | ICD-10-CM | POA: Diagnosis not present

## 2014-12-03 DIAGNOSIS — I10 Essential (primary) hypertension: Secondary | ICD-10-CM | POA: Diagnosis present

## 2014-12-03 DIAGNOSIS — Z87891 Personal history of nicotine dependence: Secondary | ICD-10-CM | POA: Diagnosis not present

## 2014-12-03 DIAGNOSIS — M545 Low back pain: Secondary | ICD-10-CM | POA: Diagnosis not present

## 2014-12-03 DIAGNOSIS — M4806 Spinal stenosis, lumbar region: Secondary | ICD-10-CM | POA: Diagnosis not present

## 2014-12-03 DIAGNOSIS — R252 Cramp and spasm: Secondary | ICD-10-CM | POA: Diagnosis present

## 2014-12-03 DIAGNOSIS — M4316 Spondylolisthesis, lumbar region: Secondary | ICD-10-CM | POA: Diagnosis not present

## 2014-12-03 DIAGNOSIS — M5137 Other intervertebral disc degeneration, lumbosacral region: Secondary | ICD-10-CM | POA: Diagnosis not present

## 2014-12-03 DIAGNOSIS — G4700 Insomnia, unspecified: Secondary | ICD-10-CM | POA: Diagnosis present

## 2014-12-03 DIAGNOSIS — E875 Hyperkalemia: Secondary | ICD-10-CM | POA: Diagnosis not present

## 2014-12-03 LAB — POCT I-STAT 4, (NA,K, GLUC, HGB,HCT)
Glucose, Bld: 167 mg/dL — ABNORMAL HIGH (ref 65–99)
HCT: 28 % — ABNORMAL LOW (ref 36.0–46.0)
Hemoglobin: 9.5 g/dL — ABNORMAL LOW (ref 12.0–15.0)
Potassium: 3.7 mmol/L (ref 3.5–5.1)
Sodium: 138 mmol/L (ref 135–145)

## 2014-12-03 MED ORDER — MUPIROCIN 2 % EX OINT
1.0000 "application " | TOPICAL_OINTMENT | Freq: Two times a day (BID) | CUTANEOUS | Status: DC
  Administered 2014-12-04 – 2014-12-05 (×3): 1 via NASAL

## 2014-12-03 NOTE — Evaluation (Signed)
Physical Therapy Evaluation Patient Details Name: Audrey ScottMaureen Fawver MRN: [id number] DOB: [birth year] Today's Date: 12/03/2014   History of Present Illness  Pt is a 69 y/o female admitted s/p L3-5 TLIF on 12/02/14.  Clinical Impression  This patient presents with acute pain and decreased functional independence following the above mentioned procedure. At the time of PT eval, pt was able to perform transfers and ambulation with RW and assist. Pt reports increased difficulty with transfers s/p surgery, and consistent min assist was provided throughout session. Pt expresses concern with returning home due to lack of social support and increased difficulty with mobility at this time. This patient is appropriate for skilled PT interventions to address functional limitations, improve safety and independence with functional mobility, and return to PLOF.     Follow Up Recommendations SNF;Supervision/Assistance - 24 hour    Equipment Recommendations  Rolling walker with 5" wheels;3in1 (PT)    Recommendations for Other Services       Precautions / Restrictions Precautions Precautions: Fall;Back Precaution Booklet Issued: Yes (comment) Precaution Comments: Discussed  Required Braces or Orthoses: Spinal Brace Spinal Brace: Lumbar corset;Applied in sitting position Restrictions Weight Bearing Restrictions: No      Mobility  Bed Mobility Overal bed mobility: Needs Assistance Bed Mobility: Rolling;Sidelying to Sit;Sit to Sidelying Rolling: Min guard Sidelying to sit: Min assist     Sit to sidelying: Min assist General bed mobility comments: Min assist for trunk elevation to sitting and LE elevation back into bed. Pt moving very slow and guarded due to pain.   Transfers Overall transfer level: Needs assistance Equipment used: Rolling walker (2 wheeled) Transfers: Sit to/from Stand Sit to Stand: Min assist         General transfer comment: Assist to power-up to full standing position,  as well as for controlled descent to EOB. x3 attempts to sit due to pain. EOB was elevated for comfort.  Ambulation/Gait Ambulation/Gait assistance: Min guard Ambulation Distance (Feet): 120 Feet Assistive device: Rolling walker (2 wheeled) Gait Pattern/deviations: Step-through pattern;Decreased stride length;Narrow base of support Gait velocity: Decreased Gait velocity interpretation: Below normal speed for age/gender General Gait Details: Very slow and guarded gait. Pt was able to ambulate a fair distance, however became very anxious with discussion of stair training. Stairs were deferred at this time.   Stairs            Wheelchair Mobility    Modified Rankin (Stroke Patients Only)       Balance Overall balance assessment: Needs assistance Sitting-balance support: Feet supported;No upper extremity supported Sitting balance-Leahy Scale: Fair     Standing balance support: No upper extremity supported;During functional activity Standing balance-Leahy Scale: Fair                               Pertinent Vitals/Pain Pain Assessment: Faces Faces Pain Scale: Hurts whole lot Pain Location: Back Pain Descriptors / Indicators: Operative site guarding;Grimacing Pain Intervention(s): Limited activity within patient's tolerance;Monitored during session;Repositioned    Home Living Family/patient expects to be discharged to:: Private residence Living Arrangements: Children Available Help at Discharge: Family;Available PRN/intermittently Type of Home: House Home Access: Stairs to enter Entrance Stairs-Rails: Right Entrance Stairs-Number of Steps: 3 Home Layout: Two level;Able to live on main level with bedroom/bathroom Home Equipment: None      Prior Function Level of Independence: Independent               Hand Dominance   Dominant  Hand: Right    Extremity/Trunk Assessment   Upper Extremity Assessment: Defer to OT evaluation           Lower  Extremity Assessment: Generalized weakness      Cervical / Trunk Assessment: Normal  Communication   Communication: No difficulties  Cognition Arousal/Alertness: Awake/alert Behavior During Therapy: Anxious Overall Cognitive Status: Within Functional Limits for tasks assessed                      General Comments      Exercises        Assessment/Plan    PT Assessment Patient needs continued PT services  PT Diagnosis Difficulty walking;Acute pain   PT Problem List Decreased strength;Decreased range of motion;Decreased activity tolerance;Decreased balance;Decreased mobility;Decreased knowledge of use of DME;Decreased safety awareness;Decreased knowledge of precautions;Pain  PT Treatment Interventions DME instruction;Gait training;Stair training;Functional mobility training;Therapeutic activities;Therapeutic exercise;Neuromuscular re-education;Patient/family education   PT Goals (Current goals can be found in the Care Plan section) Acute Rehab PT Goals Patient Stated Goal: Decrease pain PT Goal Formulation: With patient Time For Goal Achievement: 12/10/14 Potential to Achieve Goals: Good    Frequency Min 5X/week   Barriers to discharge Decreased caregiver support Pt states she will be home alone most of the time    Co-evaluation               End of Session Equipment Utilized During Treatment: Back brace Activity Tolerance: No increased pain;Patient limited by fatigue Patient left: in bed;with call bell/phone within reach;Other (comment) (Transport present to take pt to x-ray) Nurse Communication: Mobility status    Functional Assessment Tool Used: Clinical judgement Functional Limitation: Mobility: Walking and moving around Mobility: Walking and Moving Around Current Status [phone]): At least 20 percent but less than 40 percent impaired, limited or restricted Mobility: Walking and Moving Around Goal Status [phone]): At least 20 percent but less than 40  percent impaired, limited or restricted    Time: 0981-1914 PT Time Calculation (min) (ACUTE ONLY): 30 min   Charges:   PT Evaluation $Initial PT Evaluation Tier I: 1 Procedure PT Treatments $Gait Training: 8-22 mins   PT G Codes:   PT G-Codes **NOT FOR INPATIENT CLASS** Functional Assessment Tool Used: Clinical judgement Functional Limitation: Mobility: Walking and moving around Mobility: Walking and Moving Around Current Status ([id number]): At least 20 percent but less than 40 percent impaired, limited or restricted Mobility: Walking and Moving Around Goal Status [phone]): At least 20 percent but less than 40 percent impaired, limited or restricted    Conni Slipper 12/03/2014, 9:56 AM   Conni Slipper, PT, DPT Acute Rehabilitation Services Pager: [phone]

## 2014-12-03 NOTE — Care Management Note (Signed)
Case Management Note  Patient Details  Name: Audrey ScottMaureen David MRN: [id number] Date of Birth: 1945-11-03  Subjective/Objective:  69 yr old female s/p L3-L4 L4-5 transforal lumbar fusion                  Action/Plan: Case manager spoke with patient and sone concerning home health and DME needs. Choice was offered. [facility]. Patient states her son will assist her at discharge.   Expected Discharge Date:    12/04/14              Expected Discharge Plan:   Home with Home Health  In-House Referral:  NA  Discharge planning Services  CM Consult  Post Acute Care Choice:  Home Health Choice offered to:  Patient  DME Arranged:  3-N-1, Walker rolling DME Agency:  [facility]  HH Arranged:  PT, OT, Nurse's Aide HH Agency:  [facility]  Status of Service:  Completed, signed off  Medicare Important Message Given:    Date Medicare IM Given:    Medicare IM give by:    Date Additional Medicare IM Given:    Additional Medicare Important Message give by:     If discussed at Long Length of Stay Meetings, dates discussed:    Additional Comments:  Durenda GuthrieBrady, Marcelis Wissner Naomi, RN 12/03/2014, 1:59 PM

## 2014-12-03 NOTE — Op Note (Signed)
NAMEJAMELYN, BOVARD             ACCOUNT NO.:  [id number]  MEDICAL RECORD NO.:  [id number]  LOCATION:  3C09C                        FACILITY:  [facility]  PHYSICIAN:  Phylliss Strege D. Shon Baton, M.D. DATE OF BIRTH:  [birth year]  DATE OF PROCEDURE:  12/02/2014 DATE OF DISCHARGE:                              OPERATIVE REPORT   PREOPERATIVE DIAGNOSIS:  Lumbar degenerative spondylolisthesis, L3-4, L4- 5 with radicular left leg pain.  POSTOPERATIVE DIAGNOSIS:  Lumbar degenerative spondylolisthesis, L3-4, L4-5 with radicular left leg pain.  OPERATIVE PROCEDURE:  Transforaminal lumbar interbody fusion L3-4, L4-5. Fluoroscopic guidance and radiological interpretation.  INSTRUMENTATION SYSTEM USED:  NuVasive MIS pedicle screw system with 40 mm 6.5 diameter screws placed at L3 and L5 and a 45 mm 6.5 diameter pedicle screw placed at L4, 65 mm rod with appropriate locking nuts, and Titan Titanium intervertebral cages, size 9 extra long packed with DBX mix and local autograft bone.  FIRST ASSISTANT:  [place], [place].  COMPLICATIONS:  None.  CONDITION:  Stable.  HISTORY:  This is a very pleasant 69 year old active woman who has been having progressive debilitating back, buttock, and neuropathic left leg pain.  MRI and clinical examination confirmed two-level instability at 3- 4, 4-5.  After discussing all treatment options, we elected to proceed with the aforementioned procedure.  All appropriate risks, benefits, and alternatives were discussed with the patient and consent was obtained.  OPERATIVE NOTE:  The patient was brought to the operating room, placed supine on the operating table.  After successful induction of general anesthesia and endotracheal intubation, TEDs, SCDs, and a Foley were inserted.  The neuro monitoring representative then inserted all appropriate needles for intraoperative EMG and motor-evoked and sensory- evoked potential monitoring.  Once this was completed, the patient  was turned prone onto the [place] frame.  All bony prominences were well padded and the back was prepped and draped in a standard fashion.  A time-out was taken to confirm patient, procedure, and all other pertinent important data.  Once this was completed on the right side, I identified the lateral border of the L3, 4, and 5 pedicles.  I marked these areas out and infiltrated it with 0.25% Marcaine with epinephrine. I marked out a similar area on the left-hand side and infiltrated this with 1% lidocaine with epinephrine.  I then made small percutaneous incisions on the lateral aspect of the pedicles of L3, 4, and 5.  I then advanced the Jamshidi needle down to the lateral aspect of the facet complex at the junction of the transverse process and facet.  Once I confirmed satisfactory position with fluoroscopy, I then advanced the Jamshidi needle while stimulating and imaging.  Once the Jamshidi needle was nearing the medial wall of the pedicle on the AP view, I switched to the lateral.  At this point, I confirmed that the Jamshidi needle was just beyond the posterior wall of the vertebral body confirming satisfactory position and transpedicular approach.  I advanced the Jamshidi needle into the vertebral body.  I then placed the guide pin through the cannulated Jamshidi needles and then removed it.  I repeated this at the L4 and L5 levels.  Once I had all 3 pedicles cannulated,  I then enlarged the incisions to allow the tap to the placed.  I then tapped over the guide pin and again stimulated the tap while inserting it.  At no point were there any adverse neuro monitored EMG or SSEP findings to suggest breach of the pedicle.  Once I tapped all the pedicles, I then placed the appropriate size screw.  With the pedicle screws placed on the right side, I then went to the contralateral side. Here, I made a standard [place]se incision approximately 2.5 inches in length.  Sharp dissection was carried  out down to the deep fascia.  I incised the deep fascia and then bluntly dissected through the paraspinal muscles.  I then placed my Jamshidi needle and used the same technique that I had used on the contralateral side, so I placed 2 cannulated guide pins into the 3, 4, and 5 pedicles.  I then tapped and placed the pedicle screws on this side.  These screws however the 3 and 5 were connected to the retracting blades.  The L4 screw was just a screw without the polyaxial head attached.  I then connected the retracting device and I had excellent visualization of the posterior lateral aspect of the spine.  A medial retracting blade was placed to retract the paraspinal muscles and exposed the L4 lamina in its entirety.  At this point, I used my Bovie to remove the facet capsule at 4-5 level and then resected the entire inferior L4 facet complex using an osteotome and double-action Leksell rongeur.  I then used my Kerrison rongeur 2 and 3 mm to begin my L4 laminotomy.  Once I had the majority of the lamina removed, I then released the ligamentum flavum and resected this with a 3 mm Kerrison.  At this point, I had the posterolateral aspect of the thecal sac exposed.  I then dissected into the lateral gutter and used my 3 mm Kerrison to remove the remaining osteophytes from the medial border of the L5 facet complex until I could visualize the medial border of the L5 pedicle.  I then resected the L4 pars in its entirety.  I could now visualize the inferior aspect of the L4 pedicle.  The L4 nerve root was visualized in the foramen and protected with a neural patty.  The L5 nerve root was identified and protected with a neural patty.  I now had excellent visualization of the L4-5 disk space.  Medial retractor was placed to protect the thecal sac and an annulotomy was performed with a 15-blade scalpel.  Using a combination of pituitary rongeurs, side cutting curettes, and straight curettes, I  removed all of the disk material leaving the anterior annular portion alone.  Once I was scraping on subchondral bone and it was bleeding, I then irrigated the space copiously with normal saline. I then trialed with a smooth intervertebral paddles and I elected to use a size 9 extra long cage.  This was packed with the bone graft that I had harvested from the decompression along with DBX mix.  I malleted the cage into the appropriate position, which was in a vertical position and then repositioned it so that it was horizontal in the anterior third of the disk space.  This cage was properly seated, it was firm, and it was not loose.  It crossed the midline.  At this point, I was very pleased with the interbody fixation at this level.  I then repositioned my medial retractors so I could expose the  L3-4 space.  At this point in time, I again removed the inferior L3 facet complex in its entirety using a double-action Leksell rongeur and osteotome.  I then used my 3 and 2 mm Kerrison to perform a generous laminotomy of L3. I released the ligamentum flavum and resected this.  I now had visualization of the thecal sac and then a small portion of the L4 lamina that had remained.  I then took a neural patty and passed it underneath the L4 laminotomy site so that I knew the thecal sac was protected.  I then resected the remaining portion of the lamina to complete my L4 laminectomy.  At this point, I had a complete central and lateral recess decompression from L3 to L5.  I then continued into the lateral recess until I could visualize the L4 nerve root passing along the medial border of the L4 pedicle.  I then protected the nerve root with a neural patty and then identified the inferior aspect of the L3 pedicle along with the L3 nerve root.  This was also protected with a neural patty.  The thecal sac was retracted and I had excellent visualization of the 3-4 disk space.  Using the same technique  that I had utilized at L4-5, I performed a similar diskectomy at this level. It was irrigated and I confirmed that subchondral bone that I was scraping all the way over to the contralateral side.  I then placed the same size intervertebral cage after measuring and trialing.  Again, this was properly seated in the anterior third of the disk space.  At this point, I then released the kyphosis that was built into the [place] frame, which allowed some restoration of the lumbar lordosis.  I then attached the polyaxial heads to the screws and removed the retracting device.  I then measured and obtained a size 65 mm rod and passed it down the towers.  I then applied the locking caps and torqued them off according to the manufacturer's standards.  Once this was locked, I removed the towers and I irrigated this copiously with normal saline. Once I had hemostasis using bipolar electrocautery and Floseal, I placed a thrombin-soaked Gelfoam patty over the exposed lamina site and then closed the deep fascia with interrupted #1 Vicryl sutures, superficial with 2-0 Vicryl sutures, and 3-0 Monocryl for the skin.  I then went back to the right-hand side, measured and obtained the same size rod. Using a similar technique, I passed the rods and made sure that it was within the towers of all 3 levels.  I then advanced the locking nuts down, torqued them off, and removed the tower, all according to the manufacturer's standards.  I irrigated this site copiously with normal saline and closed it in a similar fashion as to the left side.  Steri- Strips and dry dressing were then ultimately applied, and the patient was extubated and transferred to the PACU without incident.  At the end of the case, all needle and sponge counts were correct.  Final x-rays were satisfactory and there were no adverse intraoperative events.  Neuro monitoring remained stable, and there was no excessive bleeding. There was also no leak  of CSF.  The first assistant was [facility], my PA.     Shean Gerding D. Shon Baton, M.D.     DDB/MEDQ  D:  12/02/2014  T:  12/03/2014  Job:  [id number]  cc:   Debria Garret D. Shon Baton, M.D.

## 2014-12-03 NOTE — Progress Notes (Signed)
Pt evaluated by PT and OT with Home Health recommendations. Spoke with Pt and son about SNF vs. Home Health and they would like to have Home Health arranged. Home Health PT/OT/Aide, rolling walker, and 3-n-1 ordered per MD order. Darl PikesSusan, CM called to set-up Home Health. Will continue to monitor Pt. Rema FendtAshley Tishie Altmann, RN

## 2014-12-03 NOTE — Evaluation (Signed)
Occupational Therapy Evaluation Patient Details Name: Audrey David MRN: [id number] DOB: 02/06/1946 Today's Date: 12/03/2014    History of Present Illness Pt is a 69 y/o female admitted s/p L3-5 TLIF.   Clinical Impression   Pt admitted for the above diagnosis and has the deficits listed below. Pt would benefit from cont OT to increase independence with basic adls so she can return home with initial 24/7 S from son for 3 days or so and then intermittent S thereafter.  Pt overall min assist to S with adls now.  Feel if son stays with her initially just for a few days pt will be very successful with HH therapy and possibly a HHAID to assist with bathing(as she is very uncomfortable having son in bathroom with her for showering) instead of a d/c to a SNF.  After [facility] therapy, pt can follow up as recommended at [facility] for therapy.    Follow Up Recommendations  Home health OT;Supervision/Assistance - 24 hour (just for first 3 days then internittent.)    Equipment Recommendations  3 in 1 bedside comode    Recommendations for Other Services       Precautions / Restrictions Precautions Precautions: Fall;Back Precaution Booklet Issued: Yes (comment) Precaution Comments: Pt stated all precautions and had many questions. Required Braces or Orthoses: Spinal Brace Spinal Brace: Lumbar corset;Applied in sitting position Restrictions Weight Bearing Restrictions: No Other Position/Activity Restrictions: Pt donned brace with VCs.  Pt felt brace not put on correctly last night but did not inform caretaker.  Educated pt to advocate for herself and let someone know if brace or anything does not seem right.      Mobility Bed Mobility Overal bed mobility: Needs Assistance Bed Mobility: Rolling;Sidelying to Sit;Sit to Sidelying Rolling: Supervision Sidelying to sit: Min guard     Sit to sidelying: Min assist General bed mobility comments: Pt did not need much hands on assist in  bed but cues for back precautions.  Transfers Overall transfer level: Needs assistance Equipment used: Rolling walker (2 wheeled) Transfers: Sit to/from [employer] Sit to Stand: Min assist Stand pivot transfers: Min guard       General transfer comment: Assist to power-up to full standing position, as well as for controlled descent to EOB. x3 attempts to sit due to pain. EOB was elevated for comfort.    Balance Overall balance assessment: Needs assistance Sitting-balance support: Feet supported Sitting balance-Leahy Scale: Fair     Standing balance support: Bilateral upper extremity supported;During functional activity Standing balance-Leahy Scale: Fair Standing balance comment: pt could let go of walker once up to do simple adl tasks in standing.                            ADL Overall ADL's : Needs assistance/impaired Eating/Feeding: Independent;Sitting   Grooming: Wash/dry hands;Wash/dry face;Min guard;Standing Grooming Details (indicate cue type and reason): Educated pt to have a cup in bathroom to spit into when brushing teeth to avoid bending over the sink Upper Body Bathing: Set up;Sitting   Lower Body Bathing: Minimal assistance;Sit to/from stand;Cueing for back precautions Lower Body Bathing Details (indicate cue type and reason): min assist to rise to standing from low surface.  If surface higher, pt did not need assist.  Cues to now twist when washing backside. Upper Body Dressing : Set up;Sitting Upper Body Dressing Details (indicate cue type and reason): pt donned brace with minimal cues. Lower Body Dressing: Minimal  assistance;Sit to/from stand;Cueing for back precautions Lower Body Dressing Details (indicate cue type and reason): min assist only when standing to manage fasteners on pants. Toilet Transfer: Minimal assistance;Ambulation;Comfort height toilet;Grab bars Toilet Transfer Details (indicate cue type and reason): min assist to  power to stand.  Pt would benefit from 3:1 over commde in her room. Toileting- ArchitectClothing Manipulation and Hygiene: Min guard;Sit to/from stand;Cueing for back precautions Toileting - Clothing Manipulation Details (indicate cue type and reason): cuing to not twist.     Functional mobility during ADLs: Min guard;Rolling walker General ADL Comments: Pt moved slowly and was very anxious.  With reassurance, pt was able to do very well with basic adls.  Pt stuggles most with transitional movements like sit to stand.  Pt has assist at home from son, but is not comfortable with him being in bathroom with her to bathe.  [facility] or two until independent with this task.     Vision     Perception     Praxis      Pertinent Vitals/Pain Pain Assessment: 0-10 Pain Score: 5  Faces Pain Scale: Hurts whole lot Pain Location: ack  Pain Descriptors / Indicators: Aching Pain Intervention(s): Limited activity within patient's tolerance;Monitored during session;Repositioned;Premedicated before session     Hand Dominance Right   Extremity/Trunk Assessment Upper Extremity Assessment Upper Extremity Assessment: Overall WFL for tasks assessed   Lower Extremity Assessment Lower Extremity Assessment: Defer to PT evaluation   Cervical / Trunk Assessment Cervical / Trunk Assessment: Normal   Communication Communication Communication: No difficulties   Cognition Arousal/Alertness: Awake/alert Behavior During Therapy: Anxious Overall Cognitive Status: Within Functional Limits for tasks assessed                     General Comments       Exercises       Shoulder Instructions      Home Living Family/patient expects to be discharged to:: Private residence Living Arrangements: Children Available Help at Discharge: Family;Available PRN/intermittently;Other (Comment) (Can be available 24/7 for first 3 days or so.) Type of Home: House Home Access: Stairs to  enter Entergy CorporationEntrance Stairs-Number of Steps: 3 Entrance Stairs-Rails: Right Home Layout: Two level;Able to live on main level with bedroom/bathroom Alternate Level Stairs-Number of Steps: Flight   Bathroom Shower/Tub: Walk-in shower;Door   Foot LockerBathroom Toilet: Standard Bathroom Accessibility: Yes How Accessible: Accessible via walker Home Equipment: None          Prior Functioning/Environment Level of Independence: Independent        Comments: very active, in pool etc    OT Diagnosis: Generalized weakness;Acute pain   OT Problem List: Decreased strength;Impaired balance (sitting and/or standing);Decreased knowledge of precautions;Decreased knowledge of use of DME or AE;Pain   OT Treatment/Interventions: Self-care/ADL training;DME and/or AE instruction    OT Goals(Current goals can be found in the care plan section) Acute Rehab OT Goals Patient Stated Goal: Decrease pain OT Goal Formulation: With patient/family Time For Goal Achievement: 12/10/14 Potential to Achieve Goals: Good ADL Goals Pt Will Perform Grooming: with supervision;standing;with adaptive equipment Pt Will Perform Lower Body Dressing: with supervision;sit to/from stand Pt Will Perform Tub/Shower Transfer: with supervision;Shower transfer;ambulating;rolling walker Additional ADL Goal #1: Pt will complete all toileting tasks with 3:1 over commode with S  OT Frequency: Min 3X/week   Barriers to D/C: Decreased caregiver support  Son can be available as needed the first few days except for bathing in shower.  Pt  may benefit from [facility] for first week or two to addres bathing needs until pt becomes I.       Co-evaluation              End of Session Equipment Utilized During Treatment: Rolling walker;Back brace Nurse Communication: Mobility status;Precautions  Activity Tolerance: Patient tolerated treatment well Patient left: in chair;with call bell/phone within reach;with family/visitor present   Time:  1610-9604 OT Time Calculation (min): 55 min Charges:  OT General Charges $OT Visit: 1 Procedure OT Evaluation $Initial OT Evaluation Tier I: 1 Procedure OT Treatments $Self Care/Home Management : 23-37 mins G-Codes:    Hope Budds 2014-12-21, 11:44 AM  [phone]

## 2014-12-03 NOTE — Progress Notes (Signed)
Patient ID: Audrey David, female   DOB: [birth year], 69 y.o.   MRN: [id number]    Subjective: 1 Day Post-Op Procedure(s) (LRB): TLIF L3-5 (N/A) Patient reports pain as 3 on 0-10 scale.   Denies CP or SOB.  Voiding without difficulty. Positive flatus. Objective: Vital signs in last 24 hours: Temp:  [97.5 F (36.4 C)-98.4 F (36.9 C)] 98.3 F (36.8 C) (10/13 0400) Pulse Rate:  [69-103] 75 (10/13 0400) Resp:  [11-20] 18 (10/13 0400) BP: (102-137)/(48-80) 102/52 mmHg (10/13 0400) SpO2:  [97 %-100 %] 99 % (10/13 0400) Weight:  [66.225 kg (146 lb)] 66.225 kg (146 lb) (10/12 0859)  Intake/Output from previous day: 10/12 0701 - 10/13 0700 In: 1100 [I.V.:600; IV Piggyback:500] Out: 2245 [Urine:1245; Blood:1000] Intake/Output this shift:    Labs:  Recent Labs  12/02/14 1531  HGB 9.5*    Recent Labs  12/02/14 1531  HCT 28.0*    Recent Labs  11/30/14 1422 12/02/14 1531  NA 137 138  K 4.5 3.7  CL 100  --   CO2 28  --   BUN 26*  --   CREATININE 1.25*  --   GLUCOSE 133* 167*  CALCIUM 9.3  --    No results for input(s): LABPT, INR in the last 72 hours.  Physical Exam: Neurologically intact ABD soft Neurovascular intact Sensation intact distally Intact pulses distally Dorsiflexion/Plantar flexion intact Incision: no drainage Compartment soft  Assessment/Plan: 1 Day Post-Op Procedure(s) (LRB): TLIF L3-5 (N/A) Advance diet Up with therapy Plan for discharge tomorrow  Anesa Fronek, Baxter Kailarmen Christina for Dr. Venita Lickahari Brooks [facility] [phone] 12/03/2014, 7:44 AM

## 2014-12-04 MED ORDER — FLEET ENEMA 7-19 GM/118ML RE ENEM
1.0000 | ENEMA | Freq: Once | RECTAL | Status: AC
  Administered 2014-12-04: 1 via RECTAL
  Filled 2014-12-04: qty 1

## 2014-12-04 MED ORDER — MAGNESIUM CITRATE PO SOLN
0.5000 | Freq: Once | ORAL | Status: DC
  Filled 2014-12-04: qty 296

## 2014-12-04 NOTE — Clinical Social Work Note (Signed)
Clinical Social Work Assessment  Patient Details  Name: Audrey David MRN: [id number] Date of Birth: 03-24-1945  Date of referral:  12/04/14               Reason for consult:  Facility Placement, Discharge Planning                Permission sought to share information with:  Family Supports, Chartered certified accountant granted to share information::  Yes, Verbal Permission Granted  Name::     Transport planner::  SNFs  Relationship::     Contact Information:     Housing/Transportation Living arrangements for the past 2 months:  Mobile Home Source of Information:  Patient Patient Interpreter Needed:  None Criminal Activity/Legal Involvement Pertinent to Current Situation/Hospitalization:  No - Comment as needed Significant Relationships:  Adult Children Lives with:  Adult Children Do you feel safe going back to the place where you live?  Yes Need for family participation in patient care:  Yes (Comment)  Care giving concerns:  Patient reports that she is anxious about returning home at this time. Nursing staff reported concerns that patient may be abused by her son at home. Patient adamantly denies this.   Social Worker assessment / plan:  CSW met with patient at bedside. CSW asked patient's son to leave room. CSW and patient spoke in private. Patient denies any abuse from her son though she states he does "lose his patience" with her, but would never harm her. Patient states that the reason she is concerned about going home is that she is embarrassed to have her son helping her with her ADLs (bathing etc.). CSW explored the option of the patient paying privately for a home aide to be with her 24/7 but she states she cannot do this. The patient is requesting SNF placement. CSW explained SNF search/placement process and answered the patient's questions. The patient is very anxious as she reports that she is afraid she is going to move the wrong way and "mess up". CSW offered  encouragement and emotional support to patient. CSW will followup with any available bed offers.   Employment status:  Disabled (Comment on whether or not currently receiving Disability) Insurance information:  Medicare PT Recommendations:  [place] / Referral to community resources:  [place]  Patient/Family's Response to care:  Patient appears to be happy with the care she has received.   Patient/Family's Understanding of and Emotional Response to Diagnosis, Current Treatment, and Prognosis:  Patient has good understanding of reason for admission and understands her post DC needs.   Emotional Assessment Appearance:  Appears stated age Attitude/Demeanor/Rapport:  Other (Patient appears very anxious and in pain) Affect (typically observed):  Anxious, Restless Orientation:  Oriented to Self, Oriented to Place, Oriented to  Time, Oriented to Situation Alcohol / Substance use:  Never Used Psych involvement (Current and /or in the community):  No (Comment)  Discharge Needs  Concerns to be addressed:  Discharge Planning Concerns Readmission within the last 30 days:  No Current discharge risk:  Physical Impairment Barriers to Discharge:  Continued Medical Work up   [employer] MSW, [place], [place], [phone]

## 2014-12-04 NOTE — Progress Notes (Signed)
Occupational Therapy Treatment Patient Details Name: Audrey ScottMaureen David MRN: [id number] DOB: April 14, 1945 Today's Date: 12/04/2014    History of present illness Pt is a 69 y/o female admitted s/p L3-5 TLIF.   OT comments  Focus of session included grooming and transfers required for ADL independence. Therapist reviewed back precautions using teach back method pt able to verbalize 3 out of 3 precautions. Pt reports she will be transferring to 5 north today, prior to going home tomorrow. Pt progressing towards OT goals, will continue to follow acutely.  Follow Up Recommendations  Home health OT    Equipment Recommendations  3 in 1 bedside comode    Recommendations for Other Services      Precautions / Restrictions Precautions Precautions: Fall;Back Precaution Booklet Issued: Yes (comment) Precaution Comments: reviewed back precautions Required Braces or Orthoses: Spinal Brace Spinal Brace: Lumbar corset;Applied in sitting position Restrictions Weight Bearing Restrictions: No       Mobility Bed Mobility Overal bed mobility: Needs Assistance Bed Mobility: Rolling Rolling: Supervision Sidelying to sit: Min guard       General bed mobility comments: Pt required additional time to complete and verbalized back precautions throughout motion  Transfers Overall transfer level: Needs assistance Equipment used: Rolling walker (2 wheeled) Transfers: Sit to/from Stand Sit to Stand: Min assist         General transfer comment: pt requires min assist to power up to a full standing position    Balance Overall balance assessment: Needs assistance Sitting-balance support: No upper extremity supported;Feet supported Sitting balance-Leahy Scale: Good     Standing balance support: Single extremity supported;During functional activity Standing balance-Leahy Scale: Fair Standing balance comment: pt required single UE support during grooming task while standing at sink                    ADL Overall ADL's : Needs assistance/impaired     Grooming: Wash/dry face;Oral care;Supervision/safety;Standing           Upper Body Dressing : Set up;Sitting Upper Body Dressing Details (indicate cue type and reason): pt donned back brace with min verbal cues                 Functional mobility during ADLs: Min guard;Rolling walker General ADL Comments: Pt able to recall previous learned education, such as two cup method for brushing teeth      Vision                     Perception     Praxis      Cognition   Behavior During Therapy: WFL for tasks assessed/performed Overall Cognitive Status: Within Functional Limits for tasks assessed                       Extremity/Trunk Assessment               Exercises     Shoulder Instructions       General Comments      Pertinent Vitals/ Pain       Pain Assessment: 0-10 Pain Score: 6  Pain Location: back Pain Descriptors / Indicators: Aching Pain Intervention(s): Premedicated before session;Repositioned  Home Living                                          Prior Functioning/Environment  Frequency Min 3X/week     Progress Toward Goals  OT Goals(current goals can now be found in the care plan section)  Progress towards OT goals: Progressing toward goals  Acute Rehab OT Goals Patient Stated Goal: none stated OT Goal Formulation: With patient/family Time For Goal Achievement: 12/10/14 Potential to Achieve Goals: Good ADL Goals Pt Will Perform Grooming: with supervision;standing;with adaptive equipment Pt Will Perform Lower Body Dressing: with supervision;sit to/from stand Pt Will Perform Tub/Shower Transfer: with supervision;Shower transfer;ambulating;rolling walker Additional ADL Goal #1: Pt will complete all toileting tasks with 3:1 over commode with S  Plan Discharge plan remains appropriate    Co-evaluation                  End of Session Equipment Utilized During Treatment: Gait belt;Rolling walker;Back brace   Activity Tolerance Patient tolerated treatment well   Patient Left in chair;with call bell/phone within reach   Nurse Communication          Time: 4098-1191 OT Time Calculation (min): 28 min  Charges: OT General Charges $OT Visit: 1 Procedure OT Treatments $Self Care/Home Management : 23-37 mins  Smiley Houseman 12/04/2014, 10:10 AM

## 2014-12-04 NOTE — Progress Notes (Signed)
    Subjective: Procedure(s) (LRB): TLIF L3-5 (N/A) 2 Days Post-Op  Patient reports pain as 4 on 0-10 scale.  Reports increased leg pain reports incisional back pain   Positive void Negative bowel movement Positive flatus Negative chest pain or shortness of breath  Objective: Vital signs in last 24 hours: Temp:  [97.6 F (36.4 C)-98.9 F (37.2 C)] 98.9 F (37.2 C) (10/14 0756) Pulse Rate:  [82-93] 88 (10/14 0756) Resp:  [16-18] 16 (10/14 0756) BP: (92-117)/(43-58) 108/58 mmHg (10/14 0756) SpO2:  [94 %-100 %] 98 % (10/14 0756)  Intake/Output from previous day:    Labs:  Recent Labs  12/02/14 1531  HCT 28.0*    Recent Labs  12/02/14 1531  NA 138  K 3.7  GLUCOSE 167*   No results for input(s): LABPT, INR in the last 72 hours.  Physical Exam: Neurologically intact ABD soft Sensation intact distally Dorsiflexion/Plantar flexion intact Incision: dressing C/D/I Compartment soft  Assessment/Plan: Patient stable  xrays satisfactory Continue mobilization with physical therapy Continue care  Advance diet Up with therapy  Plan on d/c with [facility] tomorrow Bowel meds today for constipation  Venita Lickahari Xia Stohr, MD [facility] [phone]

## 2014-12-04 NOTE — Clinical Social Work Placement (Signed)
   CLINICAL SOCIAL WORK PLACEMENT  NOTE  Date:  12/04/2014  Patient Details  Name: Audrey David MRN: [id number] Date of Birth: 06-Sep-1945  Clinical Social Work is seeking post-discharge placement for this patient at the Skilled  Nursing Facility level of care (*CSW will initial, date and re-position this form in  chart as items are completed):  Yes   Patient/family provided with [facility] Clinical Social Work Department's list of facilities offering this level of care within the geographic area requested by the patient (or if unable, by the patient's family).  Yes   Patient/family informed of their freedom to choose among providers that offer the needed level of care, that participate in Medicare, Medicaid or managed care program needed by the patient, have an available bed and are willing to accept the patient.  Yes   Patient/family informed of [facility]'s ownership interest [facility] and [facility], as well as of the fact that they are under no obligation to receive care at these facilities.  PASRR submitted to EDS on 12/04/14     PASRR number received on 12/04/14     Existing PASRR number confirmed on       FL2 transmitted to all facilities in geographic area requested by pt/family on 12/04/14     FL2 transmitted to all facilities within larger geographic area on       Patient informed that his/her managed care company has contracts with or will negotiate with certain facilities, including the following:            Patient/family informed of bed offers received.  Patient chooses bed at       Physician recommends and patient chooses bed at      Patient to be transferred to   on  .  Patient to be transferred to facility by       Patient family notified on   of transfer.  Name of family member notified:        PHYSICIAN       Additional Comment:    _______________________________________________ Roddie McBryant Sinaya Minogue MSW, LCSW, [facility], [phone]

## 2014-12-04 NOTE — Progress Notes (Signed)
Physical Therapy Treatment Patient Details Name: Audrey David MRN: [id number] DOB: 1946/01/14 Today's Date: 12/04/2014    History of Present Illness Pt is a 69 y/o female admitted s/p L3-5 TLIF.    PT Comments    Pt progressing slowly towards physical therapy goals. Pt requiring continued assist for basic transfers, and I am concerned about pt's ability to manage alone at home. Pt attempted sit<>stand x5 prior to therapist providing assist to reach full standing position. Pt confided in therapist about home situation with son, and it appears that his support may not be reliable/consistent, and she will be alone for unknown amounts of time. She also has concerns regarding son's treatment of her at home. Feel this patient would benefit from continued therapy at the SNF level to minimize risk of falls, and progress to a mod I level prior to return home.    Follow Up Recommendations  SNF;Supervision/Assistance - 24 hour     Equipment Recommendations  Rolling walker with 5" wheels;3in1 (PT)    Recommendations for Other Services       Precautions / Restrictions Precautions Precautions: Fall;Back Precaution Booklet Issued: Yes (comment) Precaution Comments: Discussed  Required Braces or Orthoses: Spinal Brace Spinal Brace: Lumbar corset;Applied in sitting position Restrictions Weight Bearing Restrictions: No    Mobility  Bed Mobility Overal bed mobility: Needs Assistance Bed Mobility: Rolling Rolling: Supervision Sidelying to sit: Min guard       General bed mobility comments: Pt sitting up in recliner upon PT arrvial   Transfers Overall transfer level: Needs assistance Equipment used: Rolling walker (2 wheeled) Transfers: Sit to/from Stand Sit to Stand: Mod assist         General transfer comment: Pt with increased difficulty achieving full stand from recliner chair. Pt was unable to complete without assistance. Mod A provided for full stand and to grab RW.    Ambulation/Gait Ambulation/Gait assistance: Min guard Ambulation Distance (Feet): 200 Feet Assistive device: Rolling walker (2 wheeled) Gait Pattern/deviations: Step-through pattern;Decreased stride length;Narrow base of support Gait velocity: Decreased Gait velocity interpretation: Below normal speed for age/gender General Gait Details: Very slow and guarded gait. Pt reports increased pain.   Stairs Stairs: Yes Stairs assistance: Min assist Stair Management: One rail Right;Step to pattern;Sideways Number of Stairs: 4 General stair comments: Pt was instructed in turning fully sideways so both hands could be supported on the railing. Min assist for balance/support and increased cueing for safety.   Wheelchair Mobility    Modified Rankin (Stroke Patients Only)       Balance Overall balance assessment: Needs assistance Sitting-balance support: Feet supported;No upper extremity supported Sitting balance-Leahy Scale: Fair     Standing balance support: Bilateral upper extremity supported;During functional activity Standing balance-Leahy Scale: Fair Standing balance comment: pt required single UE support during grooming task while standing at sink                    Cognition Arousal/Alertness: Awake/alert Behavior During Therapy: Anxious Overall Cognitive Status: Within Functional Limits for tasks assessed                      Exercises      General Comments        Pertinent Vitals/Pain Pain Assessment: Faces Pain Score: 6  Faces Pain Scale: Hurts even more Pain Location: back Pain Descriptors / Indicators: Operative site guarding;Grimacing Pain Intervention(s): Limited activity within patient's tolerance;Monitored during session;Repositioned    Home Living  Prior Function            PT Goals (current goals can now be found in the care plan section) Acute Rehab PT Goals Patient Stated Goal: Decrease pain PT  Goal Formulation: With patient Time For Goal Achievement: 12/10/14 Potential to Achieve Goals: Good Progress towards PT goals: Progressing toward goals    Frequency  Min 5X/week    PT Plan Current plan remains appropriate    Co-evaluation             End of Session Equipment Utilized During Treatment: Back brace Activity Tolerance: No increased pain;Patient limited by fatigue Patient left: in bed;with call bell/phone within reach     Time: 0945-1014 PT Time Calculation (min) (ACUTE ONLY): 29 min  Charges:  $Gait Training: 23-37 mins                    G Codes:      Conni SlipperKirkman, Viktoriya Glaspy 12/04/2014, 11:39 AM   Conni SlipperLaura Isabele Lollar, PT, DPT Acute Rehabilitation Services Pager: [phone]

## 2014-12-05 NOTE — Care Management Note (Signed)
Case Management Note  Patient Details  Name: Audrey David MRN: [id number] Date of Birth: 03/04/1945  Subjective/Objective:                   TLIF L3-5 (N/A) Action/Plan: Discharge planning  Expected Discharge Date:  12/05/14               Expected Discharge Plan:     In-House Referral:  NA  Discharge planning Services  CM Consult  Post Acute Care Choice:  Home Health Choice offered to:  Patient  DME Arranged:  3-N-1, Walker rolling DME Agency:  [place]:  PT, Nurse's Aide, RN [place] Agency:  [place]  Status of Service:  Completed, signed off  Medicare Important Message Given:    Date Medicare IM Given:    Medicare IM give by:    Date Additional Medicare IM Given:    Additional Medicare Important Message give by:     If discussed at [place] of Stay Meetings, dates discussed:    Additional Comments: CM met with pt to confirm choice of home health agency.  Pt has chosen [facility] to render HHPT/RN/aide.  Address and contact information verified by pt.  Referral called to [facility] rep, Pamala Hurry.  Pt reeived DME yesterday and son has taken home.  No other CM needs were communicated.   Dellie Catholic, RN 12/05/2014, 10:39 AM

## 2014-12-05 NOTE — Progress Notes (Signed)
Patient alert and oriented, mae's well, voiding adequate amount of urine, swallowing without difficulty, c/o mild pain and medication given.  Patient discharged home with family. Script and discharged instructions given to patient. Patient and family stated understanding of d/c instructions given and has an appointment with MD.

## 2014-12-05 NOTE — Progress Notes (Signed)
Orthopedics Progress Note  Subjective: Patient had BM, comfortable for D/C  Objective:  Filed Vitals:   12/05/14 0748  BP: 118/66  Pulse: 96  Temp: 98.7 F (37.1 C)  Resp: 18    General: Awake and alert  Musculoskeletal: lumbar dressing changed, wound looks good, no erythema Neurovascularly intact  Lab Results  Component Value Date   WBC 9.8 11/26/2014   HGB 9.5* 12/02/2014   HCT 28.0* 12/02/2014   MCV 85.4 11/26/2014   PLT 303 11/26/2014       Component Value Date/Time   NA 138 12/02/2014 1531   K 3.7 12/02/2014 1531   CL 100 11/30/2014 1422   CO2 28 11/30/2014 1422   GLUCOSE 167* 12/02/2014 1531   BUN 26* 11/30/2014 1422   CREATININE 1.25* 11/30/2014 1422   CALCIUM 9.3 11/30/2014 1422   GFRNONAA 55* 11/26/2014 1543   GFRAA 63 11/26/2014 1543    No results found for: INR, PROTIME  Assessment/Plan: POD #3 s/p Procedure(s): TLIF L3-5 Stable for discharge  Viviann SpareSteven R. Ranell PatrickNorris, MD 12/05/2014 12:19 PM

## 2014-12-05 NOTE — Discharge Summary (Signed)
Physician Discharge Summary   Patient ID: Audrey David MRN: [id number] DOB/AGE: August 24, 1945 69 y.o.  Admit date: 12/02/2014 Discharge date: 12/05/2014  Admission Diagnoses:  Active Problems:   Back pain   Discharge Diagnoses:  Same   Surgeries: Procedure(s): TLIF L3-5 on 12/02/2014   Consultants: PT  Discharged Condition: Stable  Hospital Course: Audrey ScottMaureen Dhondt is an 69 y.o. female who was admitted 12/02/2014 with a chief complaint of low back pain, and found to have a diagnosis of lumbar stenosis.  They were brought to the operating room on 12/02/2014 and underwent the above named procedures.    The patient had an uncomplicated hospital course and was stable for discharge.  Recent vital signs:  Filed Vitals:   12/05/14 0748  BP: 118/66  Pulse: 96  Temp: 98.7 F (37.1 C)  Resp: 18    Recent laboratory studies:  Results for orders placed or performed during the hospital encounter of 12/02/14  I-STAT 4, (NA,K, GLUC, HGB,HCT)  Result Value Ref Range   Sodium 138 135 - 145 mmol/L   Potassium 3.7 3.5 - 5.1 mmol/L   Glucose, Bld 167 (H) 65 - 99 mg/dL   HCT 21.328.0 (L) 08.636.0 - 57.846.0 %   Hemoglobin 9.5 (L) 12.0 - 15.0 g/dL    Discharge Medications:     Medication List    STOP taking these medications        HYDROcodone-acetaminophen 10-325 MG tablet  Commonly known as:  NORCO     meloxicam 15 MG tablet  Commonly known as:  MOBIC     tiZANidine 2 MG tablet  Commonly known as:  ZANAFLEX     zolpidem 10 MG tablet  Commonly known as:  AMBIEN      TAKE these medications        docusate sodium 100 MG capsule  Commonly known as:  COLACE  Take 100 mg by mouth daily as needed for mild constipation.     docusate sodium 100 MG capsule  Commonly known as:  COLACE  Take 1 capsule (100 mg total) by mouth 3 (three) times daily as needed for mild constipation.     lisinopril 10 MG tablet  Commonly known as:  PRINIVIL,ZESTRIL  TAKE 1 TABLET DAILY (NEED TO BE  SEEN BEFORE ANY FURTHER REFILLS)     methocarbamol 500 MG tablet  Commonly known as:  ROBAXIN  Take 1 tablet (500 mg total) by mouth 3 (three) times daily as needed for muscle spasms.     ondansetron 4 MG tablet  Commonly known as:  ZOFRAN  Take 1 tablet (4 mg total) by mouth every 8 (eight) hours as needed for nausea or vomiting.     oxyCODONE-acetaminophen 10-325 MG tablet  Commonly known as:  PERCOCET  Take 1 tablet by mouth every 4 (four) hours as needed for pain.        Diagnostic Studies: Dg Lumbar Spine 2-3 Views  12/03/2014  CLINICAL DATA:  Patient status post lumbar spinal fusion with increased back pain. EXAM: LUMBAR SPINE - 2-3 VIEW COMPARISON:  Intraoperative radiographs 12/02/2014 FINDINGS: Fusion hardware compatible with L3 through L5 posterior spinal fusion with interbody spacers. Hardware appears intact. No definite acute osseous abnormality. Lower thoracic and lumbar spine degenerative changes. The SI joints are unremarkable. Unremarkable bowel gas pattern.Soft tissue gas within the dorsal soft tissues about the spine compatible with recent postoperative state. IMPRESSION: Patient status post L3-L5 posterior spinal fusion with interbody spacers. No acute osseous abnormality. Electronically Signed   By: Kenard Gowerrew  Earlene Plater M.D.   On: 12/03/2014 09:26   Dg Lumbar Spine 2-3 Views  12/02/2014  CLINICAL DATA:  Lumbar fusion. EXAM: DG C-ARM GT 120 MIN; LUMBAR SPINE - 2-3 VIEW CONTRAST:  None. FLUOROSCOPY TIME:  Fluoroscopy Time (in minutes and seconds): 4 minutes 18 seconds. Number of Acquired Images:  3 COMPARISON:  MRI 07/18/2013 FINDINGS: L3 through L5 posterior interbody fusion. Hardware intact. Stable mild anterolisthesis L4 on L5. No acute bony abnormality. IMPRESSION: L3 through L5 posterior and interbody fusion. Stable mild anterolisthesis L4 on L5. Electronically Signed   By: Maisie Fus  Register   On: 12/02/2014 16:58   Dg C-arm Gt 120 Min  12/02/2014  CLINICAL DATA:  Lumbar  fusion. EXAM: DG C-ARM GT 120 MIN; LUMBAR SPINE - 2-3 VIEW CONTRAST:  None. FLUOROSCOPY TIME:  Fluoroscopy Time (in minutes and seconds): 4 minutes 18 seconds. Number of Acquired Images:  3 COMPARISON:  MRI 07/18/2013 FINDINGS: L3 through L5 posterior interbody fusion. Hardware intact. Stable mild anterolisthesis L4 on L5. No acute bony abnormality. IMPRESSION: L3 through L5 posterior and interbody fusion. Stable mild anterolisthesis L4 on L5. Electronically Signed   By: Maisie Fus  Register   On: 12/02/2014 16:58    Disposition: home        Follow-up Information    Follow up with Venita Lick D, MD In 2 weeks.   Specialty:  Orthopedic Surgery   Why:  For suture removal, For wound re-check, If symptoms worsen   Contact information:   [address] [place] [address] [phone]       Follow up with [facility].   Why:  Someone from [facility] will contact you concerning start date and time for therapy.   Contact information:   [address] [place] [address] [phone]        Signed: Verlee Rossetti 12/05/2014, 12:21 PM

## 2014-12-05 NOTE — Discharge Instructions (Signed)
See Spinal fusion instructions.  Follow up with Dr Shon BatonBrooks in two weeks.  [phone]

## 2014-12-05 NOTE — Progress Notes (Signed)
Physical Therapy Treatment Patient Details Name: Audrey ScottMaureen David MRN: [id number] DOB: 05/15/1945 Today's Date: 12/05/2014    History of Present Illness Pt is a 69 y/o female admitted s/p L3-5 TLIF.    PT Comments    Pt progressing towards physical therapy goals. Overall very anxious regarding discharge planning this session. Reports many obstacles for returning home, including minimal room for a walker in her home, no chair for her to sit in with arm rests, intermittent assistance provided by son, and will be sleeping on an air mattress on the floor. Pt continues to have difficulty powering up to full standing position from the recliner, and struggled to attempt on her own prior to PT providing assistance. Do not feel that pt will be able to achieve standing at home without assistance if trying to stand from a chair without arm rests.   Overall, it appears that this patient has anxiety at baseline, but continues to report reasons why discharging home would be a problem. Continue to recommend SNF at d/c for continued rehab and ADL training prior to return home.   Follow Up Recommendations  SNF;Supervision/Assistance - 24 hour     Equipment Recommendations  Rolling walker with 5" wheels;3in1 (PT)    Recommendations for Other Services       Precautions / Restrictions Precautions Precautions: Fall;Back Precaution Booklet Issued: Yes (comment) Precaution Comments: Discussed  Required Braces or Orthoses: Spinal Brace Spinal Brace: Lumbar corset;Applied in sitting position Restrictions Weight Bearing Restrictions: No    Mobility  Bed Mobility               General bed mobility comments: Pt received ambulating in room with RW.   Transfers Overall transfer level: Needs assistance Equipment used: Rolling walker (2 wheeled) Transfers: Sit to/from Stand Sit to Stand: Min assist         General transfer comment: Pt continues to have difficulty powering up to full standing  position. She attempted sit<>stand x5 times before therapist provided assistance, as pt wanted to see if she could do it on her own.  Ambulation/Gait Ambulation/Gait assistance: Supervision Ambulation Distance (Feet): 300 Feet Assistive device: Rolling walker (2 wheeled) Gait Pattern/deviations: Step-through pattern;Decreased stride length;Trunk flexed Gait velocity: Decreased Gait velocity interpretation: Below normal speed for age/gender General Gait Details: Very slow and guarded gait. Pt reports increased pain.   Stairs Stairs: Yes Stairs assistance: Min guard Stair Management: One rail Right;Sideways;Step to pattern Number of Stairs: 4 General stair comments: Pt was instructed in turning fully sideways so both hands could be supported on the railing. Min guard for balance/support and increased cueing for safety.   Wheelchair Mobility    Modified Rankin (Stroke Patients Only)       Balance Overall balance assessment: Needs assistance Sitting-balance support: Feet supported;No upper extremity supported Sitting balance-Leahy Scale: Fair     Standing balance support: Bilateral upper extremity supported;During functional activity Standing balance-Leahy Scale: Poor Standing balance comment: Pt requires UE support to maintain dynamic standing balance                    Cognition Arousal/Alertness: Awake/alert Behavior During Therapy: Anxious Overall Cognitive Status: Within Functional Limits for tasks assessed                      Exercises      General Comments        Pertinent Vitals/Pain Pain Assessment: 0-10 Pain Score: 6  Pain Location: back Pain Descriptors / Indicators:  Operative site guarding;Discomfort Pain Intervention(s): Limited activity within patient's tolerance;Monitored during session;Repositioned    Home Living                      Prior Function            PT Goals (current goals can now be found in the care plan  section) Acute Rehab PT Goals Patient Stated Goal: Decrease pain PT Goal Formulation: With patient Time For Goal Achievement: 12/10/14 Potential to Achieve Goals: Good Progress towards PT goals: Progressing toward goals    Frequency  Min 5X/week    PT Plan Current plan remains appropriate    Co-evaluation             End of Session Equipment Utilized During Treatment: Back brace Activity Tolerance: Patient limited by fatigue Patient left: in chair;with call bell/phone within reach     Time: 0801-0837 PT Time Calculation (min) (ACUTE ONLY): 36 min  Charges:  $Gait Training: 23-37 mins                    G Codes:      Conni Slipper Dec 20, 2014, 9:08 AM   Conni Slipper, PT, DPT Acute Rehabilitation Services Pager: [phone]

## 2014-12-07 DIAGNOSIS — M4726 Other spondylosis with radiculopathy, lumbar region: Secondary | ICD-10-CM | POA: Diagnosis not present

## 2014-12-07 DIAGNOSIS — M4806 Spinal stenosis, lumbar region: Secondary | ICD-10-CM | POA: Diagnosis not present

## 2014-12-07 DIAGNOSIS — Z87891 Personal history of nicotine dependence: Secondary | ICD-10-CM | POA: Diagnosis not present

## 2014-12-07 DIAGNOSIS — M4316 Spondylolisthesis, lumbar region: Secondary | ICD-10-CM | POA: Diagnosis not present

## 2014-12-07 DIAGNOSIS — Z4789 Encounter for other orthopedic aftercare: Secondary | ICD-10-CM | POA: Diagnosis not present

## 2014-12-08 DIAGNOSIS — M4316 Spondylolisthesis, lumbar region: Secondary | ICD-10-CM | POA: Diagnosis not present

## 2014-12-08 DIAGNOSIS — Z87891 Personal history of nicotine dependence: Secondary | ICD-10-CM | POA: Diagnosis not present

## 2014-12-08 DIAGNOSIS — M4726 Other spondylosis with radiculopathy, lumbar region: Secondary | ICD-10-CM | POA: Diagnosis not present

## 2014-12-08 DIAGNOSIS — M4806 Spinal stenosis, lumbar region: Secondary | ICD-10-CM | POA: Diagnosis not present

## 2014-12-08 DIAGNOSIS — Z4789 Encounter for other orthopedic aftercare: Secondary | ICD-10-CM | POA: Diagnosis not present

## 2014-12-09 ENCOUNTER — Encounter (HOSPITAL_COMMUNITY): Payer: Self-pay | Admitting: Orthopedic Surgery

## 2014-12-09 DIAGNOSIS — M4316 Spondylolisthesis, lumbar region: Secondary | ICD-10-CM | POA: Diagnosis not present

## 2014-12-09 DIAGNOSIS — Z4789 Encounter for other orthopedic aftercare: Secondary | ICD-10-CM | POA: Diagnosis not present

## 2014-12-09 DIAGNOSIS — M4726 Other spondylosis with radiculopathy, lumbar region: Secondary | ICD-10-CM | POA: Diagnosis not present

## 2014-12-09 DIAGNOSIS — Z87891 Personal history of nicotine dependence: Secondary | ICD-10-CM | POA: Diagnosis not present

## 2014-12-09 DIAGNOSIS — M4806 Spinal stenosis, lumbar region: Secondary | ICD-10-CM | POA: Diagnosis not present

## 2014-12-10 DIAGNOSIS — Z4789 Encounter for other orthopedic aftercare: Secondary | ICD-10-CM | POA: Diagnosis not present

## 2014-12-10 DIAGNOSIS — Z87891 Personal history of nicotine dependence: Secondary | ICD-10-CM | POA: Diagnosis not present

## 2014-12-10 DIAGNOSIS — M4316 Spondylolisthesis, lumbar region: Secondary | ICD-10-CM | POA: Diagnosis not present

## 2014-12-10 DIAGNOSIS — M4726 Other spondylosis with radiculopathy, lumbar region: Secondary | ICD-10-CM | POA: Diagnosis not present

## 2014-12-10 DIAGNOSIS — M4806 Spinal stenosis, lumbar region: Secondary | ICD-10-CM | POA: Diagnosis not present

## 2014-12-11 DIAGNOSIS — M4806 Spinal stenosis, lumbar region: Secondary | ICD-10-CM | POA: Diagnosis not present

## 2014-12-11 DIAGNOSIS — Z4789 Encounter for other orthopedic aftercare: Secondary | ICD-10-CM | POA: Diagnosis not present

## 2014-12-11 DIAGNOSIS — M4726 Other spondylosis with radiculopathy, lumbar region: Secondary | ICD-10-CM | POA: Diagnosis not present

## 2014-12-11 DIAGNOSIS — M4316 Spondylolisthesis, lumbar region: Secondary | ICD-10-CM | POA: Diagnosis not present

## 2014-12-11 DIAGNOSIS — Z87891 Personal history of nicotine dependence: Secondary | ICD-10-CM | POA: Diagnosis not present

## 2014-12-14 DIAGNOSIS — M4726 Other spondylosis with radiculopathy, lumbar region: Secondary | ICD-10-CM | POA: Diagnosis not present

## 2014-12-14 DIAGNOSIS — M4316 Spondylolisthesis, lumbar region: Secondary | ICD-10-CM | POA: Diagnosis not present

## 2014-12-14 DIAGNOSIS — Z87891 Personal history of nicotine dependence: Secondary | ICD-10-CM | POA: Diagnosis not present

## 2014-12-14 DIAGNOSIS — Z4789 Encounter for other orthopedic aftercare: Secondary | ICD-10-CM | POA: Diagnosis not present

## 2014-12-14 DIAGNOSIS — M4806 Spinal stenosis, lumbar region: Secondary | ICD-10-CM | POA: Diagnosis not present

## 2014-12-15 DIAGNOSIS — Z4789 Encounter for other orthopedic aftercare: Secondary | ICD-10-CM | POA: Diagnosis not present

## 2014-12-15 DIAGNOSIS — M4316 Spondylolisthesis, lumbar region: Secondary | ICD-10-CM | POA: Diagnosis not present

## 2014-12-15 DIAGNOSIS — M4726 Other spondylosis with radiculopathy, lumbar region: Secondary | ICD-10-CM | POA: Diagnosis not present

## 2014-12-15 DIAGNOSIS — Z87891 Personal history of nicotine dependence: Secondary | ICD-10-CM | POA: Diagnosis not present

## 2014-12-15 DIAGNOSIS — M4806 Spinal stenosis, lumbar region: Secondary | ICD-10-CM | POA: Diagnosis not present

## 2014-12-16 DIAGNOSIS — M4806 Spinal stenosis, lumbar region: Secondary | ICD-10-CM | POA: Diagnosis not present

## 2014-12-16 DIAGNOSIS — M4726 Other spondylosis with radiculopathy, lumbar region: Secondary | ICD-10-CM | POA: Diagnosis not present

## 2014-12-16 DIAGNOSIS — Z4789 Encounter for other orthopedic aftercare: Secondary | ICD-10-CM | POA: Diagnosis not present

## 2014-12-16 DIAGNOSIS — M4316 Spondylolisthesis, lumbar region: Secondary | ICD-10-CM | POA: Diagnosis not present

## 2014-12-16 DIAGNOSIS — Z87891 Personal history of nicotine dependence: Secondary | ICD-10-CM | POA: Diagnosis not present

## 2014-12-17 DIAGNOSIS — Z4789 Encounter for other orthopedic aftercare: Secondary | ICD-10-CM | POA: Diagnosis not present

## 2014-12-17 DIAGNOSIS — M4806 Spinal stenosis, lumbar region: Secondary | ICD-10-CM | POA: Diagnosis not present

## 2014-12-17 DIAGNOSIS — M4316 Spondylolisthesis, lumbar region: Secondary | ICD-10-CM | POA: Diagnosis not present

## 2014-12-17 DIAGNOSIS — M4726 Other spondylosis with radiculopathy, lumbar region: Secondary | ICD-10-CM | POA: Diagnosis not present

## 2014-12-17 DIAGNOSIS — Z87891 Personal history of nicotine dependence: Secondary | ICD-10-CM | POA: Diagnosis not present

## 2014-12-18 DIAGNOSIS — Z4789 Encounter for other orthopedic aftercare: Secondary | ICD-10-CM | POA: Diagnosis not present

## 2014-12-18 DIAGNOSIS — M4316 Spondylolisthesis, lumbar region: Secondary | ICD-10-CM | POA: Diagnosis not present

## 2014-12-18 DIAGNOSIS — M4806 Spinal stenosis, lumbar region: Secondary | ICD-10-CM | POA: Diagnosis not present

## 2014-12-18 DIAGNOSIS — M4726 Other spondylosis with radiculopathy, lumbar region: Secondary | ICD-10-CM | POA: Diagnosis not present

## 2014-12-18 DIAGNOSIS — Z87891 Personal history of nicotine dependence: Secondary | ICD-10-CM | POA: Diagnosis not present

## 2014-12-19 DIAGNOSIS — Z981 Arthrodesis status: Secondary | ICD-10-CM | POA: Diagnosis not present

## 2014-12-21 DIAGNOSIS — Z87891 Personal history of nicotine dependence: Secondary | ICD-10-CM | POA: Diagnosis not present

## 2014-12-21 DIAGNOSIS — M4806 Spinal stenosis, lumbar region: Secondary | ICD-10-CM | POA: Diagnosis not present

## 2014-12-21 DIAGNOSIS — Z4789 Encounter for other orthopedic aftercare: Secondary | ICD-10-CM | POA: Diagnosis not present

## 2014-12-21 DIAGNOSIS — M4726 Other spondylosis with radiculopathy, lumbar region: Secondary | ICD-10-CM | POA: Diagnosis not present

## 2014-12-21 DIAGNOSIS — M4316 Spondylolisthesis, lumbar region: Secondary | ICD-10-CM | POA: Diagnosis not present

## 2014-12-22 DIAGNOSIS — Z4789 Encounter for other orthopedic aftercare: Secondary | ICD-10-CM | POA: Diagnosis not present

## 2014-12-22 DIAGNOSIS — Z87891 Personal history of nicotine dependence: Secondary | ICD-10-CM | POA: Diagnosis not present

## 2014-12-22 DIAGNOSIS — M4726 Other spondylosis with radiculopathy, lumbar region: Secondary | ICD-10-CM | POA: Diagnosis not present

## 2014-12-22 DIAGNOSIS — M4806 Spinal stenosis, lumbar region: Secondary | ICD-10-CM | POA: Diagnosis not present

## 2014-12-22 DIAGNOSIS — M4316 Spondylolisthesis, lumbar region: Secondary | ICD-10-CM | POA: Diagnosis not present

## 2014-12-23 DIAGNOSIS — Z4789 Encounter for other orthopedic aftercare: Secondary | ICD-10-CM | POA: Diagnosis not present

## 2014-12-23 DIAGNOSIS — M4726 Other spondylosis with radiculopathy, lumbar region: Secondary | ICD-10-CM | POA: Diagnosis not present

## 2014-12-23 DIAGNOSIS — M4316 Spondylolisthesis, lumbar region: Secondary | ICD-10-CM | POA: Diagnosis not present

## 2014-12-23 DIAGNOSIS — Z87891 Personal history of nicotine dependence: Secondary | ICD-10-CM | POA: Diagnosis not present

## 2014-12-23 DIAGNOSIS — M4806 Spinal stenosis, lumbar region: Secondary | ICD-10-CM | POA: Diagnosis not present

## 2014-12-24 DIAGNOSIS — Z87891 Personal history of nicotine dependence: Secondary | ICD-10-CM | POA: Diagnosis not present

## 2014-12-24 DIAGNOSIS — M4316 Spondylolisthesis, lumbar region: Secondary | ICD-10-CM | POA: Diagnosis not present

## 2014-12-24 DIAGNOSIS — Z4789 Encounter for other orthopedic aftercare: Secondary | ICD-10-CM | POA: Diagnosis not present

## 2014-12-24 DIAGNOSIS — M4726 Other spondylosis with radiculopathy, lumbar region: Secondary | ICD-10-CM | POA: Diagnosis not present

## 2014-12-24 DIAGNOSIS — M4806 Spinal stenosis, lumbar region: Secondary | ICD-10-CM | POA: Diagnosis not present

## 2015-01-04 DIAGNOSIS — Z87891 Personal history of nicotine dependence: Secondary | ICD-10-CM | POA: Diagnosis not present

## 2015-01-04 DIAGNOSIS — M4726 Other spondylosis with radiculopathy, lumbar region: Secondary | ICD-10-CM | POA: Diagnosis not present

## 2015-01-04 DIAGNOSIS — M4806 Spinal stenosis, lumbar region: Secondary | ICD-10-CM | POA: Diagnosis not present

## 2015-01-04 DIAGNOSIS — Z4789 Encounter for other orthopedic aftercare: Secondary | ICD-10-CM | POA: Diagnosis not present

## 2015-01-04 DIAGNOSIS — M4316 Spondylolisthesis, lumbar region: Secondary | ICD-10-CM | POA: Diagnosis not present

## 2015-01-12 DIAGNOSIS — Z4789 Encounter for other orthopedic aftercare: Secondary | ICD-10-CM | POA: Diagnosis not present

## 2015-01-12 DIAGNOSIS — M4316 Spondylolisthesis, lumbar region: Secondary | ICD-10-CM | POA: Diagnosis not present

## 2015-01-12 DIAGNOSIS — M4806 Spinal stenosis, lumbar region: Secondary | ICD-10-CM | POA: Diagnosis not present

## 2015-01-12 DIAGNOSIS — M4726 Other spondylosis with radiculopathy, lumbar region: Secondary | ICD-10-CM | POA: Diagnosis not present

## 2015-01-12 DIAGNOSIS — Z87891 Personal history of nicotine dependence: Secondary | ICD-10-CM | POA: Diagnosis not present

## 2015-01-19 DIAGNOSIS — Z981 Arthrodesis status: Secondary | ICD-10-CM | POA: Diagnosis not present

## 2015-01-19 DIAGNOSIS — Z4789 Encounter for other orthopedic aftercare: Secondary | ICD-10-CM | POA: Diagnosis not present

## 2015-01-20 DIAGNOSIS — M4316 Spondylolisthesis, lumbar region: Secondary | ICD-10-CM | POA: Diagnosis not present

## 2015-01-20 DIAGNOSIS — M4806 Spinal stenosis, lumbar region: Secondary | ICD-10-CM | POA: Diagnosis not present

## 2015-01-20 DIAGNOSIS — Z87891 Personal history of nicotine dependence: Secondary | ICD-10-CM | POA: Diagnosis not present

## 2015-01-20 DIAGNOSIS — M4726 Other spondylosis with radiculopathy, lumbar region: Secondary | ICD-10-CM | POA: Diagnosis not present

## 2015-01-20 DIAGNOSIS — Z4789 Encounter for other orthopedic aftercare: Secondary | ICD-10-CM | POA: Diagnosis not present

## 2015-01-22 ENCOUNTER — Telehealth: Payer: Self-pay | Admitting: Physician Assistant

## 2015-01-22 NOTE — Telephone Encounter (Signed)
Pt left a message stating that she has an urgent matter that she needs to speak with someone about.  Please call [phone]

## 2015-01-26 DIAGNOSIS — M4726 Other spondylosis with radiculopathy, lumbar region: Secondary | ICD-10-CM | POA: Diagnosis not present

## 2015-01-26 DIAGNOSIS — M4316 Spondylolisthesis, lumbar region: Secondary | ICD-10-CM | POA: Diagnosis not present

## 2015-01-26 DIAGNOSIS — Z4789 Encounter for other orthopedic aftercare: Secondary | ICD-10-CM | POA: Diagnosis not present

## 2015-01-26 DIAGNOSIS — M4806 Spinal stenosis, lumbar region: Secondary | ICD-10-CM | POA: Diagnosis not present

## 2015-01-26 DIAGNOSIS — Z87891 Personal history of nicotine dependence: Secondary | ICD-10-CM | POA: Diagnosis not present

## 2015-02-05 ENCOUNTER — Other Ambulatory Visit: Payer: Self-pay | Admitting: Physician Assistant

## 2015-02-05 NOTE — Telephone Encounter (Signed)
Medication refilled per protocol. 

## 2015-02-09 ENCOUNTER — Telehealth: Payer: Self-pay | Admitting: Family Medicine

## 2015-02-09 MED ORDER — ACYCLOVIR 5 % EX CREA: 1.0000 "application " | TOPICAL_CREAM | CUTANEOUS | Status: DC

## 2015-02-09 NOTE — Telephone Encounter (Signed)
Rx for Zovirax cream called into [facility].  Insurance does not cover Abreva.  Only come in 30 gram tube.  Left pt message

## 2015-02-09 NOTE — Telephone Encounter (Signed)
See if insurance will cover either Abreva---Apply 5 times per day until healed---2 grams + 3 refills Zovirax --- Apply 5 times per day---------------3 grams + 3 refills

## 2015-02-09 NOTE — Telephone Encounter (Signed)
Pt being plagued by fever blisters.  Abreva at pharmacy was way expensive.  Can we call something in (topical) that the insurance can help pay for?

## 2015-02-10 DIAGNOSIS — M4807 Spinal stenosis, lumbosacral region: Secondary | ICD-10-CM | POA: Diagnosis not present

## 2015-02-19 DIAGNOSIS — M4807 Spinal stenosis, lumbosacral region: Secondary | ICD-10-CM | POA: Diagnosis not present

## 2015-03-11 ENCOUNTER — Other Ambulatory Visit: Payer: Self-pay | Admitting: Physician Assistant

## 2015-03-11 MED ORDER — MELOXICAM 7.5 MG PO TABS: 7.5000 mg | ORAL_TABLET | Freq: Every day | ORAL | Status: DC | PRN

## 2015-03-11 NOTE — Telephone Encounter (Signed)
Need to decrease dose to decrease effect on kidney. Can give Mobic 7.5 mg 1 by mouth daily PRN-- take with food--- #30+3 refills

## 2015-03-11 NOTE — Telephone Encounter (Signed)
Refused Mobic .   New prescription sent for Mobic 7.5mg .

## 2015-03-11 NOTE — Telephone Encounter (Signed)
Ok to refill?  Medication removed from list after lumbar fusion.

## 2015-03-12 ENCOUNTER — Telehealth: Payer: Self-pay | Admitting: Physician Assistant

## 2015-03-12 DIAGNOSIS — G4700 Insomnia, unspecified: Secondary | ICD-10-CM

## 2015-03-12 MED ORDER — ZOLPIDEM TARTRATE 10 MG PO TABS: 10.0000 mg | ORAL_TABLET | Freq: Every evening | ORAL | Status: DC | PRN

## 2015-03-12 NOTE — Telephone Encounter (Signed)
Approved #90+1 

## 2015-03-12 NOTE — Telephone Encounter (Signed)
Prescription printed and awaiting signature for faxing.

## 2015-03-12 NOTE — Telephone Encounter (Signed)
Ok to refill??  Last office visit 11/26/2014.  Last refill to mail order 09/08/2014, #1 refill.

## 2015-03-12 NOTE — Telephone Encounter (Signed)
[facility]   Patient is calling to get rx sent to mail order for her [place]

## 2015-03-19 ENCOUNTER — Telehealth: Payer: Self-pay | Admitting: Family Medicine

## 2015-03-19 NOTE — Telephone Encounter (Signed)
rec'd letter form [facility] part D provider concerning the Zolpidem 10 mg.  Is on formulary but is a "step therapy" drug.  Pt has been on this since becoming our pt in 2008.  She says she had tried sveral other sleep aids but does not remember what they were.  We did same request last year and had approved after PA submitted.  Have submitted another PA request thru [facility].

## 2015-03-25 NOTE — Telephone Encounter (Signed)
Patient calling again regarding this medication please call her at [phone]

## 2015-03-26 NOTE — Telephone Encounter (Signed)
Med was approved, rec'd approval today.  [facility] ID [facility] [id number]  Good 02/23/2015 until 03/24/2016

## 2015-04-27 ENCOUNTER — Encounter: Payer: Self-pay | Admitting: *Deleted

## 2015-05-20 ENCOUNTER — Other Ambulatory Visit: Payer: Self-pay | Admitting: Physician Assistant

## 2015-05-20 ENCOUNTER — Ambulatory Visit: Payer: Self-pay | Admitting: Physician Assistant

## 2015-05-20 NOTE — Telephone Encounter (Signed)
Medication refilled per protocol. 

## 2015-05-27 ENCOUNTER — Encounter: Payer: Self-pay | Admitting: Physician Assistant

## 2015-05-27 ENCOUNTER — Ambulatory Visit (INDEPENDENT_AMBULATORY_CARE_PROVIDER_SITE_OTHER): Payer: Medicare Other | Admitting: Physician Assistant

## 2015-05-27 VITALS — BP 114/70 | HR 88 | Temp 99.4°F | Resp 20 | Wt 152.0 lb

## 2015-05-27 DIAGNOSIS — B349 Viral infection, unspecified: Secondary | ICD-10-CM | POA: Diagnosis not present

## 2015-05-27 DIAGNOSIS — I10 Essential (primary) hypertension: Secondary | ICD-10-CM

## 2015-05-27 DIAGNOSIS — J988 Other specified respiratory disorders: Secondary | ICD-10-CM

## 2015-05-27 DIAGNOSIS — B9789 Other viral agents as the cause of diseases classified elsewhere: Secondary | ICD-10-CM

## 2015-05-27 MED ORDER — BENZONATATE 200 MG PO CAPS: 200.0000 mg | ORAL_CAPSULE | Freq: Two times a day (BID) | ORAL | Status: DC | PRN

## 2015-05-28 LAB — BASIC METABOLIC PANEL WITH GFR
BUN: 14 mg/dL (ref 7–25)
CO2: 24 mmol/L (ref 20–31)
Calcium: 8.1 mg/dL — ABNORMAL LOW (ref 8.6–10.4)
Chloride: 104 mmol/L (ref 98–110)
Creat: 1.11 mg/dL — ABNORMAL HIGH (ref 0.60–0.93)
GFR, Est African American: 58 mL/min — ABNORMAL LOW (ref >=60)
GFR, Est Non African American: 50 mL/min — ABNORMAL LOW (ref >=60)
Glucose, Bld: 106 mg/dL — ABNORMAL HIGH (ref 70–99)
Potassium: 4.2 mmol/L (ref 3.5–5.3)
Sodium: 138 mmol/L (ref 135–146)

## 2015-05-28 NOTE — Progress Notes (Signed)
Patient ID: Audrey David MRN: [id number], DOB: September 23, 1945, 70 y.o. Date of Encounter: '@DATE'$ @  Chief Complaint:  Chief Complaint  Patient presents with  . routine check up    also c/o bad URI    HPI: 70 y.o. year old female  presents with above.  Recently received info from [facility] that pt taking Lisinopril '10mg'$  but has Dx Hyperkalemia.  When I received this, reviewed chart.  11/26/2014 potassium was elevated at 5.9. Pt was instructed of diet changes and to hold lisinopril.  Recheck lab 11/30/14--potassium down to 4.5.  Prior to 11/26/2014 all potassium levels were normal.  Today pt reports that she is currently taking lisinopril and has been taking it for months--since her surgery.   Today she reports that on Sunday evening she developed cough. Had chills several days ago. No know fever. Had to drive back and forth to [place] twice recently--thinks she got "worn down".  No other complaints or concerns.   Past Medical History  Diagnosis Date  . Phlebitis   . Hemorrhoids   . Hypertension   . Insomnia   . Arthritis   . Detached retina      Home Meds: Outpatient Prescriptions Prior to Visit  Medication Sig Dispense Refill  . docusate sodium (COLACE) 100 MG capsule Take 100 mg by mouth daily as needed for mild constipation.    Marland Kitchen lisinopril (PRINIVIL,ZESTRIL) 10 MG tablet Take 1 tablet (10 mg total) by mouth daily. 90 tablet 1  . meloxicam (MOBIC) 7.5 MG tablet TAKE 1 TABLET DAILY WITH FOOD AS NEEDED FOR PAIN 90 tablet 0  . zolpidem (AMBIEN) 10 MG tablet Take 1 tablet (10 mg total) by mouth at bedtime as needed for sleep. Use as bedtime as directed 90 tablet 1  . acyclovir cream (ZOVIRAX) 5 % Apply 1 application topically every 3 (three) hours. 30 g 3  . ondansetron (ZOFRAN) 4 MG tablet Take 1 tablet (4 mg total) by mouth every 8 (eight) hours as needed for nausea or vomiting. 20 tablet 0  . docusate sodium (COLACE) 100 MG capsule Take 1 capsule (100 mg total) by  mouth 3 (three) times daily as needed for mild constipation. 30 capsule 0  . methocarbamol (ROBAXIN) 500 MG tablet Take 1 tablet (500 mg total) by mouth 3 (three) times daily as needed for muscle spasms. (Patient not taking: Reported on 05/27/2015) 60 tablet 0  . oxyCODONE-acetaminophen (PERCOCET) 10-325 MG tablet Take 1 tablet by mouth every 4 (four) hours as needed for pain. (Patient not taking: Reported on 05/27/2015) 60 tablet 0   No facility-administered medications prior to visit.    Allergies:  Allergies  Allergen Reactions  . Ace Inhibitors Cough  . Codeine Nausea Only    70 years ago    Social History   Social History  . Marital Status: Single    Spouse Name: N/A  . Number of Children: N/A  . Years of Education: N/A   Occupational History  . Not on file.   Social History Main Topics  . Smoking status: Former Research scientist (life sciences)  . Smokeless tobacco: Never Used     Comment:  " quit smoking cigarettes in 2007 "  . Alcohol Use: Yes     Comment: Social   . Drug Use: No  . Sexual Activity: Not on file   Other Topics Concern  . Not on file   Social History Narrative   Daily caffeine     Family History  Problem Relation Age  of Onset  . Hypertension Mother   . Cancer Father 69    multiple myeloma. Throat Cancer.     Review of Systems:  See HPI for pertinent ROS. All other ROS negative.    Physical Exam: Blood pressure 114/70, pulse 88, temperature 99.4 F (37.4 C), temperature source Oral, resp. rate 20, weight 152 lb (68.947 kg)., Body mass index is 25.29 kg/(m^2). General:WNWD WF.  Appears in no acute distress. Head: Normocephalic, atraumatic, eyes without discharge, sclera non-icteric, nares are without discharge. Bilateral auditory canals clear, TM's are without perforation, pearly grey and translucent with reflective cone of light bilaterally. Oral cavity moist, posterior pharynx without exudate, erythema, peritonsillar abscess. No tenderness with percussion to frontal or  maxillary sinuses bilaterally.   Neck: Supple. No thyromegaly. No lymphadenopathy. Lungs: Clear bilaterally to auscultation without wheezes, rales, or rhonchi. Breathing is unlabored. Heart: RRR with S1 S2. No murmurs, rubs, or gallops. Musculoskeletal:  Strength and tone normal for age. Extremities/Skin: Warm and dry.  Neuro: Alert and oriented X 3. Moves all extremities spontaneously. Gait is normal. CNII-XII grossly in tact. Psych:  Responds to questions appropriately with a normal affect.     ASSESSMENT AND PLAN:  70 y.o. year old female with  1. Essential hypertension BP at goal/controlled. Recheck BMET to check for Hyperkalemia.  - BASIC METABOLIC PANEL WITH GFR  2. Viral respiratory infection Will give med for cough suppressant. She is to use otc expectorant. F/U if develops fever or if cough worsens significantly or not resolving after 7 - 10 days . - benzonatate (TESSALON) 200 MG capsule; Take 1 capsule (200 mg total) by mouth 2 (two) times daily as needed for cough.  Dispense: 20 capsule; Refill: 0   Signed, [address] [place], [place], [facility] 05/28/2015 4:19 PM

## 2015-05-31 ENCOUNTER — Telehealth: Payer: Self-pay | Admitting: Family Medicine

## 2015-05-31 MED ORDER — AZITHROMYCIN 250 MG PO TABS: ORAL_TABLET | ORAL | Status: DC

## 2015-05-31 NOTE — Telephone Encounter (Signed)
Azithromycin 250mg :  Day 1:  Take 2 daily.  Days 2 - 5: Take 1 daily. Dispense #6+0 refills

## 2015-05-31 NOTE — Telephone Encounter (Signed)
Cough no better, has gotten worse.  Chest and head hurts from coughing.  No fever, non productive.  Has had some chills.

## 2015-05-31 NOTE — Telephone Encounter (Signed)
Pt called and made aware of RX.  Rx to pharmacy.  Call later in week if still not better.

## 2015-07-06 ENCOUNTER — Other Ambulatory Visit: Payer: Self-pay

## 2015-07-06 DIAGNOSIS — Z1231 Encounter for screening mammogram for malignant neoplasm of breast: Secondary | ICD-10-CM

## 2015-07-12 ENCOUNTER — Other Ambulatory Visit: Payer: Self-pay | Admitting: Family Medicine

## 2015-07-12 NOTE — Telephone Encounter (Signed)
Approved #90+1 

## 2015-07-12 NOTE — Telephone Encounter (Signed)
Prescription sent to pharmacy.

## 2015-07-12 NOTE — Telephone Encounter (Signed)
Ok to refill 

## 2015-08-04 ENCOUNTER — Other Ambulatory Visit: Payer: Self-pay | Admitting: Physician Assistant

## 2015-08-04 NOTE — Telephone Encounter (Signed)
Refill appropriate and filled per protocol. 

## 2015-08-10 ENCOUNTER — Ambulatory Visit
Admission: RE | Admit: 2015-08-10 | Discharge: 2015-08-10 | Disposition: A | Discharge status: Home / Self Care | Payer: Medicare Other | Source: Ambulatory Visit

## 2015-08-10 DIAGNOSIS — Z1231 Encounter for screening mammogram for malignant neoplasm of breast: Secondary | ICD-10-CM

## 2015-08-10 LAB — HM MAMMOGRAPHY

## 2015-08-11 ENCOUNTER — Encounter: Payer: Self-pay | Admitting: Family Medicine

## 2015-08-18 ENCOUNTER — Other Ambulatory Visit: Payer: Self-pay | Admitting: Physician Assistant

## 2015-08-18 NOTE — Telephone Encounter (Signed)
Medication refilled per protocol. 

## 2015-09-07 ENCOUNTER — Other Ambulatory Visit: Payer: Self-pay | Admitting: Physician Assistant

## 2015-09-07 DIAGNOSIS — G4700 Insomnia, unspecified: Secondary | ICD-10-CM

## 2015-09-07 NOTE — Telephone Encounter (Signed)
Pt is requesting a refill of Zolpidem 10 mg [facility]

## 2015-09-07 NOTE — Telephone Encounter (Signed)
LRF to mail order 03/12/15 #90 + 1  LOV 05/27/15  OK refill to mail order?

## 2015-09-08 ENCOUNTER — Other Ambulatory Visit: Payer: Self-pay | Admitting: *Deleted

## 2015-09-08 DIAGNOSIS — G4700 Insomnia, unspecified: Secondary | ICD-10-CM

## 2015-09-08 MED ORDER — ZOLPIDEM TARTRATE 10 MG PO TABS: 10.0000 mg | ORAL_TABLET | Freq: Every evening | ORAL | Status: DC | PRN

## 2015-09-08 NOTE — Telephone Encounter (Signed)
Approved #90+1 

## 2015-09-16 NOTE — Telephone Encounter (Signed)
Refill has been completed.

## 2015-11-02 ENCOUNTER — Other Ambulatory Visit: Payer: Self-pay | Admitting: Physician Assistant

## 2015-11-02 NOTE — Telephone Encounter (Signed)
Medication refilled per protocol. 

## 2015-11-29 ENCOUNTER — Encounter: Payer: Self-pay | Admitting: Gastroenterology

## 2016-01-24 ENCOUNTER — Encounter: Payer: Self-pay | Admitting: Gastroenterology

## 2016-02-01 ENCOUNTER — Other Ambulatory Visit: Payer: Self-pay | Admitting: Physician Assistant

## 2016-02-22 ENCOUNTER — Other Ambulatory Visit: Payer: Self-pay | Admitting: Family Medicine

## 2016-02-22 NOTE — Telephone Encounter (Signed)
Ambien approved for # 90 + 0---needs OV within next 3 months.

## 2016-02-22 NOTE — Telephone Encounter (Signed)
Pt calling, needs refill of Ambien sent to [facility].  LRF 09/08/15  #90 + 1 to mail order.  OK refill?   Also asked if [facility] pays for yearly physical.  Told yes, they do and highly recommend that you have one.  Call and schedule when ever.  Has been more then 1 year.

## 2016-02-23 MED ORDER — ZOLPIDEM TARTRATE 10 MG PO TABS: 10.0000 mg | ORAL_TABLET | Freq: Every evening | ORAL | 0 refills | Status: DC | PRN

## 2016-02-23 NOTE — Telephone Encounter (Signed)
Prescription faxed

## 2016-02-28 ENCOUNTER — Ambulatory Visit (INDEPENDENT_AMBULATORY_CARE_PROVIDER_SITE_OTHER): Payer: Medicare Other | Admitting: Physician Assistant

## 2016-02-28 VITALS — BP 108/68 | HR 81 | Temp 98.1°F | Resp 16 | Wt 153.8 lb

## 2016-02-28 DIAGNOSIS — Z780 Asymptomatic menopausal state: Secondary | ICD-10-CM | POA: Diagnosis not present

## 2016-02-28 DIAGNOSIS — Z23 Encounter for immunization: Secondary | ICD-10-CM

## 2016-02-28 DIAGNOSIS — Z0000 Encounter for general adult medical examination without abnormal findings: Secondary | ICD-10-CM

## 2016-02-28 DIAGNOSIS — E2839 Other primary ovarian failure: Secondary | ICD-10-CM

## 2016-02-28 LAB — CBC WITH DIFFERENTIAL/PLATELET
Basophils Absolute: 67 cells/uL (ref 0–200)
Basophils Relative: 1 %
Eosinophils Absolute: 335 cells/uL (ref 15–500)
Eosinophils Relative: 5 %
HCT: 35.4 % (ref 35.0–45.0)
Hemoglobin: 11.2 g/dL — ABNORMAL LOW (ref 12.0–15.0)
Lymphocytes Relative: 27 %
Lymphs Abs: 1809 cells/uL (ref 850–3900)
MCH: 26.7 pg — ABNORMAL LOW (ref 27.0–33.0)
MCHC: 31.6 g/dL — ABNORMAL LOW (ref 32.0–36.0)
MCV: 84.3 fL (ref 80.0–100.0)
MPV: 10.8 fL (ref 7.5–12.5)
Monocytes Absolute: 871 cells/uL (ref 200–950)
Monocytes Relative: 13 %
Neutro Abs: 3618 cells/uL (ref 1500–7800)
Neutrophils Relative %: 54 %
Platelets: 240 10*3/uL (ref 140–400)
RBC: 4.2 MIL/uL (ref 3.80–5.10)
RDW: 15.2 % — ABNORMAL HIGH (ref 11.0–15.0)
WBC: 6.7 10*3/uL (ref 3.8–10.8)

## 2016-02-28 LAB — COMPLETE METABOLIC PANEL WITH GFR
ALT: 18 U/L (ref 6–29)
AST: 13 U/L (ref 10–35)
Albumin: 4 g/dL (ref 3.6–5.1)
Alkaline Phosphatase: 73 U/L (ref 33–130)
BUN: 22 mg/dL (ref 7–25)
CO2: 26 mmol/L (ref 20–31)
Calcium: 9 mg/dL (ref 8.6–10.4)
Chloride: 107 mmol/L (ref 98–110)
Creat: 1.01 mg/dL — ABNORMAL HIGH (ref 0.60–0.93)
GFR, Est African American: 65 mL/min (ref >=60)
GFR, Est Non African American: 56 mL/min — ABNORMAL LOW (ref >=60)
Glucose, Bld: 94 mg/dL (ref 70–99)
Potassium: 4.3 mmol/L (ref 3.5–5.3)
Sodium: 141 mmol/L (ref 135–146)
Total Bilirubin: 0.4 mg/dL (ref 0.2–1.2)
Total Protein: 6.4 g/dL (ref 6.1–8.1)

## 2016-02-28 LAB — LIPID PANEL
Cholesterol: 190 mg/dL (ref <200)
HDL: 54 mg/dL (ref >50)
LDL Cholesterol: 115 mg/dL — ABNORMAL HIGH (ref <100)
Total CHOL/HDL Ratio: 3.5 Ratio (ref <5.0)
Triglycerides: 105 mg/dL (ref <150)
VLDL: 21 mg/dL (ref <30)

## 2016-02-28 LAB — TSH: TSH: 1.79 mIU/L

## 2016-02-28 NOTE — Progress Notes (Signed)
Patient ID: Audrey David MRN: [id number], DOB: 03/13/1945, 71 y.o. Date of Encounter: 02/28/2016,   Chief Complaint: Physical (CPE)  HPI: 71 y.o. y/o white female  here for CPE.   She has no specific complaints or concerns to address today. States that she is very glad she had the back surgery by Dr. Rolena Infante. Says that she has felt so much better ever since that surgery. Goes to different types of exercise classes with a friend every morning. Silver sneakers. Chair yoga mat yoga zumba, etc.  Review of Systems: Consitutional: No fever, chills, fatigue, night sweats, lymphadenopathy. No significant/unexplained weight changes. Eyes: No visual changes, eye redness, or discharge. ENT/Mouth: No ear pain, sore throat, nasal drainage, or sinus pain. Cardiovascular: No chest pressure,heaviness, tightness or squeezing, even with exertion. No increased shortness of breath or dyspnea on exertion.No palpitations, edema, orthopnea, PND. Respiratory: No cough, hemoptysis, SOB, or wheezing. Gastrointestinal: No anorexia, dysphagia, reflux, pain, nausea, vomiting, hematemesis, diarrhea, constipation, BRBPR, or melena. Breast: No mass, nodules, bulging, or retraction. No skin changes or inflammation. No nipple discharge. No lymphadenopathy. Genitourinary: No dysuria, hematuria, incontinence, vaginal discharge, pruritis, burning, abnormal bleeding, or pain. Musculoskeletal: No decreased ROM, No joint pain or swelling. No significant pain in neck, or extremities. Skin: No rash, pruritis, or concerning lesions. Neurological: No headache, dizziness, syncope, seizures, tremors, memory loss, coordination problems, or paresthesias. Psychological: No anxiety, depression, hallucinations, SI/HI. Endocrine: No polydipsia, polyphagia, polyuria, or known diabetes.No increased fatigue. No palpitations/rapid heart rate. No significant/unexplained weight change. All other systems were reviewed and are otherwise  negative.  Past Medical History:  Diagnosis Date  . Arthritis   . Detached retina   . Hemorrhoids   . Hypertension   . Insomnia   . Phlebitis      Past Surgical History:  Procedure Laterality Date  . ABDOMINAL HYSTERECTOMY    . COLONOSCOPY    . TUBAL LIGATION      Home Meds:  Outpatient Medications Prior to Visit  Medication Sig Dispense Refill  . docusate sodium (COLACE) 100 MG capsule Take 100 mg by mouth daily as needed for mild constipation.    Marland Kitchen lisinopril (PRINIVIL,ZESTRIL) 10 MG tablet TAKE 1 TABLET DAILY (PATIENT NEEDS TO BE SEEN BEFORE ANY FURTHER REFILLS) 90 tablet 0  . tiZANidine (ZANAFLEX) 2 MG tablet     . zolpidem (AMBIEN) 10 MG tablet Take 1 tablet (10 mg total) by mouth at bedtime as needed for sleep. Use as bedtime as directed 90 tablet 0  . azithromycin (ZITHROMAX) 250 MG tablet Take 2 tablets by mouth on day 1, then 1 tablet by mouth on days 2-5. (Patient not taking: Reported on 02/28/2016) 6 tablet 0  . benzonatate (TESSALON) 200 MG capsule Take 1 capsule (200 mg total) by mouth 2 (two) times daily as needed for cough. (Patient not taking: Reported on 02/28/2016) 20 capsule 0  . meloxicam (MOBIC) 7.5 MG tablet TAKE 1 TABLET DAILY WITH FOOD AS NEEDED FOR PAIN (Patient not taking: Reported on 02/28/2016) 90 tablet 0  . tiZANidine (ZANAFLEX) 2 MG tablet TAKE 1 TABLET EVERY 6 HOURS AS NEEDED 90 tablet 1   No facility-administered medications prior to visit.     Allergies:  Allergies  Allergen Reactions  . Ace Inhibitors Cough  . Codeine Nausea Only    59 years ago    Social History   Social History  . Marital status: Single    Spouse name: N/A  . Number of children: N/A  . Years  of education: N/A   Occupational History  . Not on file.   Social History Main Topics  . Smoking status: Former Research scientist (life sciences)  . Smokeless tobacco: Never Used     Comment:  " quit smoking cigarettes in 2007 "  . Alcohol use Yes     Comment: Social   . Drug use: No  . Sexual  activity: Not on file   Other Topics Concern  . Not on file   Social History Narrative   Daily caffeine     Family History  Problem Relation Age of Onset  . Hypertension Mother   . Cancer Father 61    multiple myeloma. Throat Cancer.    Physical Exam: Blood pressure 108/68, pulse 81, temperature 98.1 F (36.7 C), temperature source Oral, resp. rate 16, weight 153 lb 12.8 oz (69.8 kg), SpO2 97 %., Body mass index is 25.59 kg/m. General: Well developed, well nourished, WF. Appears  in no acute distress. HEENT: Normocephalic, atraumatic. Conjunctiva pink, sclera non-icteric. Pupils 2 mm constricting to 1 mm, round, regular, and equally reactive to light and accomodation. EOMI. Internal auditory canal clear. TMs with good cone of light and without pathology. Nasal mucosa pink. Nares are without discharge. No sinus tenderness. Oral mucosa pink.  Pharynx without exudate.   Neck: Supple. Trachea midline. No thyromegaly. Full ROM. No lymphadenopathy.No Carotid Bruits. Lungs: Clear to auscultation bilaterally without wheezes, rales, or rhonchi. Breathing is of normal effort and unlabored. Cardiovascular: RRR with S1 S2. No murmurs, rubs, or gallops. Distal pulses 2+ symmetrically. No carotid or abdominal bruits. Breast: Symmetrical. No masses. Nipples without discharge. Abdomen: Soft, non-tender, non-distended with normoactive bowel sounds. No hepatosplenomegaly or masses. No rebound/guarding. No CVA tenderness. No hernias.  Genitourinary:  External genitalia without lesions. Vaginal mucosa pink.No discharge present. H/O Partial Hysterectomy-findings c/w this.  No adnexal mass or tenderness. Musculoskeletal: Full range of motion and 5/5 strength throughout.  Skin: Warm and moist without erythema, ecchymosis, wounds, or rash. Neuro: A+Ox3. CN II-XII grossly intact. Moves all extremities spontaneously. Full sensation throughout. Normal gait. DTR 2+ throughout upper and lower extremities. Psych:   Responds to questions appropriately with a normal affect.   Assessment/Plan:  71 y.o. y/o female here for CPE  1. Visit for preventive health examination  A. Screening Labs: She is fasting. Check labs now.  B. Pap: She has had a hysterectomy without oophorectomy.  Hysterectomy was not secondary to cancer so she does not need any further Pap smears. Pelvic exam performed today.  C. Screening Mammogram: Last mammogram 08/10/15 negative  D. DEXA/BMD:  This was done 04/18/2010. Normal. On calcium, vitamin D, exercise. She needs to have another DEXA scan. She is agreeable. I have placed order for this to be scheduled today.  E. Colorectal Cancer Screening: At prior complete physical with me she had told me she had had a colonoscopy in [place] in 2007. She said she was told to repeat in 5 years. However she does not have those records. At CPE 2015 she reported that she recently saw GI secondary to some hemorrhoids.  GI is following this.  F. Immunizations:  Influenza: recommended flu vaccine but she refuses/defers. Tetanus: 11/02/2011 Pneumococcal: She received Prevnar 13 here on 05/13/13. Needs Pneumovax 23.----Discussed this today and she is agreeable. Pneumovax 23 given here 02/28/2016 Zostavax: She received this 07/13/2010    2. Hypertension Blood  pressure is at goal. Lab  normal. Continue current medication.  3. Insomnia Controlled current medication  4. Blind left eye  5. Arthritis Continue  current medication  6. Internal hemorrhoids without mention of complication Per GI  7. Special screening for malignant neoplasms, colon Per GI     Signed, [address] [place], [place], [facility] 02/28/2016 8:55 AM

## 2016-02-28 NOTE — Addendum Note (Signed)
Addended by: Phineas SemenJOHNSON, Tahlia Deamer A on: 02/28/2016 11:33 AM   Modules accepted: Orders

## 2016-02-29 LAB — VITAMIN D 25 HYDROXY (VIT D DEFICIENCY, FRACTURES): Vit D, 25-Hydroxy: 44 ng/mL (ref 30–100)

## 2016-03-01 ENCOUNTER — Encounter: Payer: Self-pay | Admitting: Family Medicine

## 2016-03-01 DIAGNOSIS — M8580 Other specified disorders of bone density and structure, unspecified site: Secondary | ICD-10-CM | POA: Insufficient documentation

## 2016-03-06 ENCOUNTER — Other Ambulatory Visit: Payer: Self-pay | Admitting: Family Medicine

## 2016-03-06 DIAGNOSIS — M8580 Other specified disorders of bone density and structure, unspecified site: Secondary | ICD-10-CM

## 2016-03-13 ENCOUNTER — Ambulatory Visit
Admission: RE | Admit: 2016-03-13 | Discharge: 2016-03-13 | Disposition: A | Discharge status: Home / Self Care | Payer: Medicare Other | Source: Ambulatory Visit | Attending: Physician Assistant | Admitting: Physician Assistant

## 2016-03-13 DIAGNOSIS — M8580 Other specified disorders of bone density and structure, unspecified site: Secondary | ICD-10-CM

## 2016-03-16 ENCOUNTER — Other Ambulatory Visit: Payer: Self-pay

## 2016-03-16 MED ORDER — CALCIUM 1200 1200-1000 MG-UNIT PO CHEW: 1200.0000 mg | CHEWABLE_TABLET | Freq: Every day | ORAL | 2 refills | Status: DC

## 2016-03-16 MED ORDER — ALENDRONATE SODIUM 70 MG PO TABS: 70.0000 mg | ORAL_TABLET | ORAL | 3 refills | Status: DC

## 2016-03-16 MED ORDER — VITAMIN D (CHOLECALCIFEROL) 10 MCG (400 UNIT) PO CAPS: 2.0000 | ORAL_CAPSULE | Freq: Every day | ORAL | 2 refills | Status: DC

## 2016-03-17 MED ORDER — ALENDRONATE SODIUM 70 MG PO TABS: 70.0000 mg | ORAL_TABLET | ORAL | 3 refills | Status: DC

## 2016-03-29 ENCOUNTER — Other Ambulatory Visit: Payer: Self-pay | Admitting: Physician Assistant

## 2016-03-29 NOTE — Telephone Encounter (Signed)
Approved. # 90 + 3. 

## 2016-03-29 NOTE — Telephone Encounter (Signed)
rx filled

## 2016-03-29 NOTE — Telephone Encounter (Signed)
Ok to refill 

## 2016-05-02 ENCOUNTER — Other Ambulatory Visit: Payer: Self-pay | Admitting: Physician Assistant

## 2016-05-02 NOTE — Telephone Encounter (Signed)
Refill appropriate 

## 2016-05-11 ENCOUNTER — Other Ambulatory Visit: Payer: Self-pay | Admitting: Physician Assistant

## 2016-05-11 MED ORDER — ZOLPIDEM TARTRATE 10 MG PO TABS: 10.0000 mg | ORAL_TABLET | Freq: Every evening | ORAL | 0 refills | Status: DC | PRN

## 2016-05-11 NOTE — Telephone Encounter (Signed)
Approved. #90+ 0. 

## 2016-05-11 NOTE — Telephone Encounter (Signed)
Pt needs [facility]ill [facility] please call with any questions [phone].

## 2016-05-11 NOTE — Telephone Encounter (Deleted)
rx called in

## 2016-05-11 NOTE — Telephone Encounter (Signed)
Last refill 1-3 Last OV 1-8 This is a mail order ok to refill?

## 2016-05-11 NOTE — Telephone Encounter (Signed)
[facility]

## 2016-05-16 ENCOUNTER — Telehealth: Payer: Self-pay

## 2016-05-16 NOTE — Telephone Encounter (Signed)
Prior authorization started on Zolpidem tartrate 10mg  tablet

## 2016-05-31 NOTE — Telephone Encounter (Addendum)
Prior authorization for Zolpidem was denied because it is not covered under patient medical benefit. Through [employer] with patient and she stated she did receive her Zolpidem#90 0 [facility]ill [facility]

## 2016-07-03 ENCOUNTER — Other Ambulatory Visit: Payer: Self-pay | Admitting: Family Medicine

## 2016-07-03 DIAGNOSIS — Z1231 Encounter for screening mammogram for malignant neoplasm of breast: Secondary | ICD-10-CM

## 2016-08-01 ENCOUNTER — Other Ambulatory Visit: Payer: Self-pay | Admitting: Physician Assistant

## 2016-08-01 NOTE — Telephone Encounter (Signed)
Rx filled per protocol  

## 2016-08-04 ENCOUNTER — Encounter: Payer: Self-pay | Admitting: Family Medicine

## 2016-08-07 ENCOUNTER — Other Ambulatory Visit: Payer: Self-pay

## 2016-08-07 NOTE — Telephone Encounter (Signed)
Approved. #30+3. 

## 2016-08-07 NOTE — Telephone Encounter (Signed)
Last OV 02/28/2016 Last refill 3/22 Ok to refill?

## 2016-08-08 MED ORDER — ZOLPIDEM TARTRATE 10 MG PO TABS: 10.0000 mg | ORAL_TABLET | Freq: Every evening | ORAL | 3 refills | Status: DC | PRN

## 2016-08-08 NOTE — Telephone Encounter (Signed)
PCP out of office will fax on 6/20 once signature is obtained

## 2016-08-10 ENCOUNTER — Ambulatory Visit: Payer: Medicare Other

## 2016-08-11 ENCOUNTER — Telehealth: Payer: Self-pay | Admitting: *Deleted

## 2016-08-11 NOTE — Telephone Encounter (Signed)
Lunesta 2mg  1 po QHS PRN # 30 + 2

## 2016-08-11 NOTE — Telephone Encounter (Signed)
[facility] phone to say that Zolpidem is not covered under this patients ins. Is there an alternative? Ref# [id number]

## 2016-08-14 MED ORDER — ESZOPICLONE 2 MG PO TABS: 2.0000 mg | ORAL_TABLET | Freq: Every evening | ORAL | 2 refills | Status: DC | PRN

## 2016-08-14 NOTE — Telephone Encounter (Signed)
Rx faxed to pharmacy  

## 2016-08-14 NOTE — Addendum Note (Signed)
Addended by: Phineas SemenJOHNSON, TIFFANY A on: 08/14/2016 10:05 AM   Modules accepted: Orders

## 2016-08-15 ENCOUNTER — Telehealth: Payer: Self-pay

## 2016-08-15 ENCOUNTER — Ambulatory Visit
Admission: RE | Admit: 2016-08-15 | Discharge: 2016-08-15 | Disposition: A | Discharge status: Home / Self Care | Payer: Medicare Other | Source: Ambulatory Visit | Attending: Family Medicine | Admitting: Family Medicine

## 2016-08-15 DIAGNOSIS — Z1231 Encounter for screening mammogram for malignant neoplasm of breast: Secondary | ICD-10-CM

## 2016-08-15 NOTE — Telephone Encounter (Signed)
Zolpidem is not covered alternatives are as follows:  Trazodone HCL Rozerem Tabs Silenor tabs  Which would you like to switch to?

## 2016-08-15 NOTE — Telephone Encounter (Signed)
Rozerem 8mg  1 po QHS PRN # 30 + 2. Remove Ambien and Lunesta off med list.

## 2016-08-18 ENCOUNTER — Telehealth: Payer: Self-pay

## 2016-08-18 MED ORDER — RAMELTEON 8 MG PO TABS: ORAL_TABLET | ORAL | 1 refills | Status: DC

## 2016-08-18 MED ORDER — RAMELTEON 8 MG PO TABS: ORAL_TABLET | ORAL | 2 refills | Status: DC

## 2016-08-18 NOTE — Telephone Encounter (Signed)
Spoke with patient she is aware of the medication change

## 2016-08-18 NOTE — Telephone Encounter (Signed)
Pt req a 90 supply due to cost of $50.00

## 2016-08-28 ENCOUNTER — Ambulatory Visit: Payer: Medicare Other | Admitting: Physician Assistant

## 2016-09-01 ENCOUNTER — Encounter: Payer: Self-pay | Admitting: Physician Assistant

## 2016-09-13 ENCOUNTER — Telehealth: Payer: Self-pay

## 2016-09-13 NOTE — Telephone Encounter (Signed)
Patient is currently taking Rozerem and states the medication is not working for her and is requesting an alt.   Medications covered by insurance are TrazodoneHCL, Rozerem tabs and Silenor tabs. Pls advise

## 2016-09-14 NOTE — Telephone Encounter (Signed)
Try trazodone 50 poqhs prn.

## 2016-09-15 MED ORDER — TRAZODONE HCL 50 MG PO TABS: 50.0000 mg | ORAL_TABLET | Freq: Every evening | ORAL | 1 refills | Status: DC | PRN

## 2016-09-15 NOTE — Telephone Encounter (Signed)
Spoke with patient she will try the trazodone and let me know if that is working for her. I will send to local pharmacy and then send mail order if patient feels the dose is working for her.

## 2016-09-20 ENCOUNTER — Ambulatory Visit (INDEPENDENT_AMBULATORY_CARE_PROVIDER_SITE_OTHER): Payer: Medicare Other | Admitting: Physician Assistant

## 2016-09-20 ENCOUNTER — Encounter: Payer: Self-pay | Admitting: Physician Assistant

## 2016-09-20 VITALS — BP 110/70 | HR 83 | Temp 97.9°F | Resp 16 | Wt 157.0 lb

## 2016-09-20 DIAGNOSIS — L239 Allergic contact dermatitis, unspecified cause: Secondary | ICD-10-CM | POA: Diagnosis not present

## 2016-09-20 DIAGNOSIS — G4700 Insomnia, unspecified: Secondary | ICD-10-CM | POA: Diagnosis not present

## 2016-09-20 MED ORDER — METHYLPREDNISOLONE ACETATE 40 MG/ML IJ SUSP
60.0000 mg | Freq: Once | INTRAMUSCULAR | Status: AC
  Administered 2016-09-20: 60 mg via INTRAMUSCULAR

## 2016-09-20 NOTE — Progress Notes (Signed)
    Patient ID: Audrey ScottMaureen David MRN: [id number], DOB: [birth year], 71 y.o. Date of Encounter: 09/20/2016, 12:57 PM    Chief Complaint:  Chief Complaint  Patient presents with  . Poison Ivy    all over body      HPI: 71 y.o. year old female presents with above.   Says this started on her right finger.  "Then must have spread it to my bottom when I used the bathroom----now with extremely itchy rash on [vulva region] " Also area on back of right knee/leg.   "Also wanted to let you know the sleeping meds are working"     Home Meds:   Outpatient Medications Prior to Visit  Medication Sig Dispense Refill  . alendronate (FOSAMAX) 70 MG tablet Take 1 tablet (70 mg total) by mouth every 7 (seven) days. Take with a full glass of water on an empty stomach,sit upright for 30 minutes 12 tablet 3  . Calcium Carbonate-Vit D-Min (CALCIUM 1200) 1200-1000 MG-UNIT CHEW Chew 1,200 mg by mouth daily. 90 each 2  . lisinopril (PRINIVIL,ZESTRIL) 10 MG tablet TAKE 1 TABLET DAILY 90 tablet 0  . tiZANidine (ZANAFLEX) 2 MG tablet TAKE 1 TABLET EVERY 6 HOURS AS NEEDED 90 tablet 3  . traZODone (DESYREL) 50 MG tablet Take 1 tablet (50 mg total) by mouth at bedtime as needed for sleep. 30 tablet 1  . Vitamin D, Cholecalciferol, 400 units CAPS Take 2 capsules by mouth daily. 180 capsule 2  . azithromycin (ZITHROMAX) 250 MG tablet Take 2 tablets by mouth on day 1, then 1 tablet by mouth on days 2-5. (Patient not taking: Reported on 02/28/2016) 6 tablet 0  . benzonatate (TESSALON) 200 MG capsule Take 1 capsule (200 mg total) by mouth 2 (two) times daily as needed for cough. (Patient not taking: Reported on 02/28/2016) 20 capsule 0  . docusate sodium (COLACE) 100 MG capsule Take 100 mg by mouth daily as needed for mild constipation.    . meloxicam (MOBIC) 7.5 MG tablet TAKE 1 TABLET DAILY WITH FOOD AS NEEDED FOR PAIN (Patient not taking: Reported on 02/28/2016) 90 tablet 0   No facility-administered medications prior to  visit.     Allergies:  Allergies  Allergen Reactions  . Ace Inhibitors Cough  . Codeine Nausea Only    60 years ago      Review of Systems: See HPI for pertinent ROS. All other ROS negative.    Physical Exam: Blood pressure 110/70, pulse 83, temperature 97.9 F (36.6 C), temperature source Oral, resp. rate 16, weight 157 lb (71.2 kg), SpO2 97 %., Body mass index is 26.13 kg/m. General:  WNWD WF. Appears in no acute distress. Neck: Supple. No thyromegaly. No lymphadenopathy. Lungs: Clear bilaterally to auscultation without wheezes, rales, or rhonchi. Breathing is unlabored. Heart: Regular rhythm. No murmurs, rubs, or gallops. Msk:  Strength and tone normal for age. Skin: Erythematous vessicles on 4th finger-right.  Area of erythema, papules on posterior aspect right knee/leg. Neuro: Alert and oriented X 3. Moves all extremities spontaneously. Gait is normal. CNII-XII grossly in tact. Psych:  Responds to questions appropriately with a normal affect.     ASSESSMENT AND PLAN:  71 y.o. year old female with  1. Allergic dermatitis Give Depomedrol 60mg  IM.  F/U if itching, rash do not resolve or if returns.   2. Insomnia, unspecified type --controlled on currnet med. Cont current tx   Murray HodgkinsSigned, Vi Whitesel Beth [place], [place], [facility] 09/20/2016 12:57 PM

## 2016-10-10 ENCOUNTER — Telehealth: Payer: Self-pay

## 2016-10-10 NOTE — Telephone Encounter (Signed)
Patient called and stated the Trazodone 50mg  is no longer working for her and she would like  to try the [place] again and appeal the prior authorization since she has tried other options and they have not worked.  Patient states she is taking the trazodone 50mg  along with the Rozerem 8mg  at bedtime.Patient states she is waking up every 2-2.5 hours.  Pls advise

## 2016-10-11 MED ORDER — ZOLPIDEM TARTRATE ER 12.5 MG PO TBCR: 12.5000 mg | EXTENDED_RELEASE_TABLET | Freq: Every day | ORAL | 2 refills | Status: DC

## 2016-10-11 NOTE — Telephone Encounter (Signed)
Place Order for Ambien 12.5mg  1 po QHS # 30 + 2 If Insurance denies, then will send them documentation that other meds do not work, Catering manager

## 2016-10-11 NOTE — Telephone Encounter (Signed)
Rx sent faxed to pharmacy

## 2016-10-12 NOTE — Telephone Encounter (Signed)
LVM for patient that the new Rx has been sent over

## 2016-10-13 ENCOUNTER — Telehealth: Payer: Self-pay | Admitting: Family Medicine

## 2016-10-13 NOTE — Telephone Encounter (Signed)
LRF 10/11/16??  #30 + 2  Looks like duplicate request. Rx faxed yesterday

## 2016-10-17 ENCOUNTER — Telehealth: Payer: Self-pay

## 2016-10-17 NOTE — Telephone Encounter (Signed)
Patient called about her Remus Loffler rx that was sent to pharmacy. Rx has been sent in as well as [facility] needed to stay on dose prescribed. I spoke with Windell Moulding [facility] and she confirmed all paperwork had been received and that they did not need any other information from our office.

## 2016-10-30 ENCOUNTER — Other Ambulatory Visit: Payer: Self-pay | Admitting: Physician Assistant

## 2016-11-26 ENCOUNTER — Other Ambulatory Visit: Payer: Self-pay | Admitting: Physician Assistant

## 2016-11-28 ENCOUNTER — Ambulatory Visit (INDEPENDENT_AMBULATORY_CARE_PROVIDER_SITE_OTHER): Payer: Medicare Other | Admitting: Family Medicine

## 2016-11-28 ENCOUNTER — Encounter: Payer: Self-pay | Admitting: Family Medicine

## 2016-11-28 VITALS — BP 130/70 | HR 74 | Temp 99.0°F | Resp 16 | Wt 153.0 lb

## 2016-11-28 DIAGNOSIS — L255 Unspecified contact dermatitis due to plants, except food: Secondary | ICD-10-CM

## 2016-11-28 MED ORDER — PREDNISONE 10 MG PO TABS: ORAL_TABLET | ORAL | 0 refills | Status: DC

## 2016-11-28 MED ORDER — METHYLPREDNISOLONE ACETATE 40 MG/ML IJ SUSP
60.0000 mg | Freq: Once | INTRAMUSCULAR | Status: AC
  Administered 2016-11-28: 60 mg via INTRAMUSCULAR

## 2016-11-28 NOTE — Addendum Note (Signed)
Addended by: Legrand Rams B on: 11/28/2016 10:57 AM   Modules accepted: Orders

## 2016-11-28 NOTE — Progress Notes (Signed)
   Subjective:    Patient ID: Audrey David, female    DOB: 1946/01/06, 71 y.o.   MRN: [id number]  Patient presents for Poison ivy   Pt here with poison ivy, has had a few times before. She was out in her yard, started on her arms now on her chest/neck, no difficulty breathing, no itching in mouth.  She is leaving for [place] in 2 days  She did tried benadryl cream and hydrocortisone     Review Of Systems:  GEN- denies fatigue, fever, weight loss,weakness, recent illness HEENT- denies eye drainage, change in vision, nasal discharge, CVS- denies chest pain, palpitations RESP- denies SOB, cough, wheeze ABD- denies N/V, change in stools, abd pain GU- denies dysuria, hematuria, dribbling, incontinence MSK- denies joint pain, muscle aches, injury Neuro- denies headache, dizziness, syncope, seizure activity       Objective:    BP 130/70   Pulse 74   Temp 99 F (37.2 C) (Oral)   Resp 16   Wt 153 lb (69.4 kg)   BMI 25.46 kg/m  GEN- NAD, alert and oriented x3 HEENT- PERRL, EOMI, non injected sclera, pink conjunctiva, MMM, oropharynx clear Neck- Supple, no LAD  CVS- RRR, no murmur RESP-CTAB Skin- erythematous liner maculapular lesions on right forearm, a few blisters, scattered areas on chest, left neck         Assessment & Plan:      Problem List Items Addressed This Visit    None    Visit Diagnoses    Dermatitis due to plants, including poison ivy, sumac, and oak    -  Primary   Depo Medrol  IM given, steroid taper if not clearing, okay to continue topical cortisone/benadryl      Note: This dictation was prepared with Dragon dictation along with smaller phrase technology. Any transcriptional errors that result from this process are unintentional.

## 2016-11-28 NOTE — Patient Instructions (Signed)
Steroid shot given Use the oral steroids if it does not clear up  F/U as needed

## 2017-01-16 ENCOUNTER — Other Ambulatory Visit: Payer: Self-pay

## 2017-01-16 MED ORDER — ZOLPIDEM TARTRATE ER 12.5 MG PO TBCR: 12.5000 mg | EXTENDED_RELEASE_TABLET | Freq: Every day | ORAL | 2 refills | Status: DC

## 2017-01-16 NOTE — Telephone Encounter (Signed)
[facility]

## 2017-01-16 NOTE — Telephone Encounter (Signed)
Approved. #30+2. 

## 2017-01-16 NOTE — Telephone Encounter (Signed)
Last OV 09/20/2016 Last refill 10/11/2016 Okay to refill?

## 2017-01-25 ENCOUNTER — Other Ambulatory Visit: Payer: Self-pay | Admitting: Physician Assistant

## 2017-01-28 ENCOUNTER — Other Ambulatory Visit: Payer: Self-pay | Admitting: Physician Assistant

## 2017-01-31 NOTE — Telephone Encounter (Signed)
rx filled per protocol  

## 2017-02-13 ENCOUNTER — Other Ambulatory Visit: Payer: Self-pay | Admitting: Physician Assistant

## 2017-02-15 NOTE — Telephone Encounter (Signed)
Approved. # 90 + 3. 

## 2017-02-15 NOTE — Telephone Encounter (Signed)
[facility]

## 2017-02-15 NOTE — Telephone Encounter (Signed)
Ok to refill 

## 2017-04-16 ENCOUNTER — Other Ambulatory Visit: Payer: Self-pay

## 2017-04-16 MED ORDER — ZOLPIDEM TARTRATE ER 12.5 MG PO TBCR: 12.5000 mg | EXTENDED_RELEASE_TABLET | Freq: Every day | ORAL | 0 refills | Status: DC

## 2017-04-16 NOTE — Telephone Encounter (Signed)
Approved. Sent. 

## 2017-04-16 NOTE — Telephone Encounter (Signed)
Patient has mail order and states  they will not mail her rx until the end of the week. So patint is asking if 7 day supply can be sent to local  [facility]

## 2017-04-16 NOTE — Telephone Encounter (Signed)
Call placed to patient she is aware rx has been sent to pharmacy

## 2017-04-16 NOTE — Telephone Encounter (Signed)
Patient has mail order and is requesting 1 week worth to be sent to local pharmacy

## 2017-05-01 ENCOUNTER — Other Ambulatory Visit: Payer: Self-pay | Admitting: Physician Assistant

## 2017-05-11 ENCOUNTER — Other Ambulatory Visit: Payer: Self-pay

## 2017-05-11 NOTE — Telephone Encounter (Signed)
LAST OV 09/20/2016 LAST REFILL 04/16/2017 OK TO REFILL?

## 2017-05-14 MED ORDER — ZOLPIDEM TARTRATE ER 12.5 MG PO TBCR: 12.5000 mg | EXTENDED_RELEASE_TABLET | Freq: Every day | ORAL | 1 refills | Status: DC

## 2017-06-14 ENCOUNTER — Telehealth: Payer: Self-pay

## 2017-06-14 NOTE — Telephone Encounter (Signed)
Patient called left a message stating she found a lump on her left breast and is not due for a mammogram until 07/2018. Patient would like to know if an order can be put in for [facility]. Pls advise

## 2017-06-14 NOTE — Telephone Encounter (Signed)
The protocall is that she has to come in for office visit and then I will order appropriate tests.

## 2017-06-14 NOTE — Telephone Encounter (Signed)
Call placed to patient Audrey David to call office and schedule an appointment

## 2017-06-29 ENCOUNTER — Other Ambulatory Visit: Payer: Self-pay

## 2017-06-29 ENCOUNTER — Encounter: Payer: Self-pay | Admitting: Family Medicine

## 2017-06-29 ENCOUNTER — Ambulatory Visit (INDEPENDENT_AMBULATORY_CARE_PROVIDER_SITE_OTHER): Payer: Medicare Other | Admitting: Family Medicine

## 2017-06-29 VITALS — BP 130/70 | HR 68 | Temp 97.8°F | Ht 64.0 in | Wt 152.2 lb

## 2017-06-29 DIAGNOSIS — L255 Unspecified contact dermatitis due to plants, except food: Secondary | ICD-10-CM

## 2017-06-29 MED ORDER — METHYLPREDNISOLONE ACETATE 40 MG/ML IJ SUSP
60.0000 mg | Freq: Once | INTRAMUSCULAR | Status: AC
  Administered 2017-06-29: 60 mg via INTRAMUSCULAR

## 2017-06-29 MED ORDER — PREDNISONE 10 MG PO TABS: ORAL_TABLET | ORAL | 0 refills | Status: DC

## 2017-06-29 NOTE — Progress Notes (Signed)
Patient ID: Audrey David, female    DOB: [birth year], 72 y.o.   MRN: [id number]  PCP: Audrey Sheldon, PA-C  Chief Complaint  Patient presents with  . Poison [employer] to hands    Subjective:   Audrey David is a 72 y.o. female, presents to clinic with CC of itchy rash and swelling to both of her hands and also to above her right eye, began to 3 days ago as she has been working out in her yard, she forgot to wear gloves.  She knows that the poison ivy exposure because she gets this multiple times a year especially when she forgets to wear gloves.  Rash is more concentrated around her ring finger on her right hand, and the palm of her left hand, she believes that she did touch or scratch above her right eyes to her right upper eyelid is swollen and itchy.  She washed herself with a soap trying to the oils off, but she has not tried any other treatments or medications for it.  It has been gradually worsening over the past 2 to 3 days.  She denies any lip swelling, sensation of airway closure, swelling of her tongue, stridor, wheeze, chest pain, headache, nausea or vomiting.  She usually comes to the clinic to get a steroid shot and a taper and her symptoms resolved.  She denies any other associated symptoms, no other aggravating or alleviating factors.   Patient Active Problem List   Diagnosis Date Noted  . Osteopenia 03/01/2016  . Back pain 12/02/2014  . 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 Arthritis   . Hypertension   . Insomnia   . Internal hemorrhoids without mention of complication 28/00/3491  . Special screening for malignant neoplasms, colon 08/08/2011     Prior to Admission medications   Medication Sig Start Date End Date Taking? Authorizing Provider  Cholecalciferol (VITAMIN D3) 400 units CAPS TAKE 2 CAPSULES DAILY 11/27/16  Yes Dena Billet B, PA-C  lisinopril (PRINIVIL,ZESTRIL) 10 MG tablet TAKE 1 TABLET DAILY (NO MORE REFILLS UNTIL OFFICE VISIT) 05/01/17  Yes Dena Billet  B, PA-C  tiZANidine (ZANAFLEX) 2 MG tablet TAKE 1 TABLET EVERY 6 HOURS AS NEEDED 02/15/17  Yes Dena Billet B, PA-C  Turmeric 500 MG TABS Take 500 mg by mouth 2 (two) times daily.   Yes [provider]  zolpidem (AMBIEN CR) 12.5 MG CR tablet Take 1 tablet (12.5 mg total) by mouth at bedtime. 05/14/17  Yes Dena Billet B, PA-C  alendronate (FOSAMAX) 70 MG tablet TAKE 1 TABLET EVERY 7 DAYS WITH FULL GLASS OF WATER ON AN EMPTY STOMACH. SIT UPRIGHT FOR 30 MINUTES Patient not taking: Reported on 06/29/2017 01/25/17   Dena Billet B, PA-C  Calcium Carbonate-Vit D-Min (CALCIUM 1200) 1200-1000 MG-UNIT CHEW Chew 1,200 mg by mouth daily. Patient not taking: Reported on 06/29/2017 03/16/16   Dena Billet B, PA-C  predniSONE (DELTASONE) 10 MG tablet Take 40 mg x  2 days, 20 mg x 2 days, 10 mg x 2 days 06/29/17   Delsa Grana, PA-C     Allergies  Allergen Reactions  . Ace Inhibitors Cough  . Codeine Nausea Only    60 years ago     Family History  Problem Relation Age of Onset  . Hypertension Mother   . Cancer Father 58       multiple myeloma. Throat Cancer.     Social History   Socioeconomic History  . Marital status:  Single    Spouse name: Not on file  . Number of children: Not on file  . Years of education: Not on file  . Highest education level: Not on file  Occupational History  . Not on file  Social Needs  . Financial resource strain: Not on file  . Food insecurity:    Worry: Not on file    Inability: Not on file  . Transportation needs:    Medical: Not on file    Non-medical: Not on file  Tobacco Use  . Smoking status: Former Research scientist (life sciences)  . Smokeless tobacco: Never Used  . Tobacco comment:  " quit smoking cigarettes in 2007 "  Substance and Sexual Activity  . Alcohol use: Yes    Comment: Social   . Drug use: No  . Sexual activity: Not on file  Lifestyle  . Physical activity:    Days per week: Not on file    Minutes per session: Not on file  . Stress: Not on file    Relationships  . Social connections:    Talks on phone: Not on file    Gets together: Not on file    Attends religious service: Not on file    Active member of club or organization: Not on file    Attends meetings of clubs or organizations: Not on file    Relationship status: Not on file  . Intimate partner violence:    Fear of current or ex partner: Not on file    Emotionally abused: Not on file    Physically abused: Not on file    Forced sexual activity: Not on file  Other Topics Concern  . Not on file  Social History Narrative   Daily caffeine      Review of Systems  Constitutional: Negative.  Negative for activity change, appetite change, chills, diaphoresis, fatigue, fever and unexpected weight change.  HENT: Negative.  Negative for congestion, facial swelling, hearing loss, mouth sores, postnasal drip, rhinorrhea, sinus pressure, sinus pain, sneezing, sore throat, tinnitus, trouble swallowing and voice change.   Eyes: Negative.  Negative for photophobia, pain, discharge, redness and itching.  Respiratory: Negative.  Negative for cough, chest tightness, shortness of breath, wheezing and stridor.   Cardiovascular: Negative.  Negative for chest pain, palpitations and leg swelling.  Gastrointestinal: Negative.  Negative for abdominal pain, constipation, diarrhea, nausea and vomiting.  Endocrine: Negative.   Genitourinary: Negative.   Musculoskeletal: Negative.   Skin: Positive for rash. Negative for color change, pallor and wound.  Allergic/Immunologic: Negative.   Neurological: Negative.  Negative for dizziness, syncope, weakness, light-headedness, numbness and headaches.  Hematological: Negative.   Psychiatric/Behavioral: Negative.   All other systems reviewed and are negative.      Objective:    Vitals:   06/29/17 1049  BP: 130/70  Pulse: 68  Temp: 97.8 F (36.6 C)  TempSrc: Oral  SpO2: 99%  Weight: 152 lb 4 oz (69.1 kg)  Height: '5\' 4"'$  (1.626 m)       Physical Exam  Constitutional: She is oriented to person, place, and time. She appears well-developed and well-nourished.  Non-toxic appearance. No distress.  HENT:  Head: Normocephalic and atraumatic.  Right Ear: External ear normal.  Left Ear: External ear normal.  Nose: Nose normal.  Mouth/Throat: Uvula is midline, oropharynx is clear and moist and mucous membranes are normal. No oropharyngeal exudate.  No swelling of lips, no rash to face or neck, tongue normal in appearance, no uvula edema or erythema, uvula  midline, no stridor, no trismus  Eyes: Pupils are equal, round, and reactive to light. Conjunctivae, EOM and lids are normal. Right eye exhibits no discharge. Left eye exhibits no discharge. No scleral icterus.  Edema of right upper eyelid without induration or erythema, no tenderness to palpation, no periorbital edema or erythema Conjunctive are normal, no discharge  Neck: Normal range of motion and phonation normal. Neck supple. No tracheal deviation present.  Normal phonation, no stridor  Cardiovascular: Normal rate, regular rhythm, normal heart sounds, intact distal pulses and normal pulses. Exam reveals no gallop and no friction rub.  No murmur heard. Pulses:      Radial pulses are 2+ on the right side, and 2+ on the left side.       Posterior tibial pulses are 2+ on the right side, and 2+ on the left side.  Pulmonary/Chest: Effort normal and breath sounds normal. No stridor. No respiratory distress. She has no wheezes. She has no rhonchi. She has no rales. She exhibits no tenderness.  Abdominal: Soft. Normal appearance and bowel sounds are normal. She exhibits no distension. There is no tenderness. There is no rebound and no guarding.  Musculoskeletal: Normal range of motion. She exhibits no edema or deformity.  Lymphadenopathy:    She has no cervical adenopathy.  Neurological: She is alert and oriented to person, place, and time. She exhibits normal muscle tone.  Coordination and gait normal.  Skin: Skin is warm, dry and intact. Capillary refill takes less than 2 seconds. Rash noted. No ecchymosis, no laceration, no lesion and no purpura noted. Rash is papular. Rash is not pustular, not vesicular and not urticarial. She is not diaphoretic. There is erythema. No cyanosis. No pallor. Nails show no clubbing.  Small erythematous papules around proximal phalanx of right fourth finger, palmar aspect with some mild associated swelling to the finger and hand, no surrounding induration, erythema, drainage, warmth To left palm linear distribution of small erythematous papules with some excoriations and mild diffuse edema of left hand without induration, erythema or warmth No other rash to extremities, chest, abdomen or back  Psychiatric: She has a normal mood and affect. Her speech is normal and behavior is normal. Judgment and thought content normal.  Nursing note and vitals reviewed.         Assessment & Plan:      ICD-10-CM   1. Dermatitis due to plants, including poison ivy, sumac, and oak L25.5 methylPREDNISolone acetate (DEPO-MEDROL) injection 60 mg    predniSONE (DELTASONE) 10 MG tablet    Rash to bilateral hands and some swelling and itching to right upper eyelid, onset to 3 days ago when patient was working outside in her yard, she commonly comes in contact with poison ivy and has had multiple recurrent instances of contact dermatitis secondary to plant exposure, this is consistent with that, she did touch her eye and has some eye swelling but she denies any other symptoms that would be concerning for allergic reaction involving airway.  Airway clear, no uvular edema, no stridor, no wheeze, vital signs stable, no respiratory distress at all.  Patient was given a steroid injection in clinic and a short steroid taper -after reviewing prior visits for the same chief complaint.  Because of facial involvement I did write out instructions for the patient to  take high high-dose Benadryl, Pepcid and 60 mg dose of prednisone if she has any spreading of swelling, rash or itching to her face, lips, she has any sensation of airway  closure, stridor or wheeze.  This was a printed out for her, highlighted and reviewed.  She is instructed to be seen immediately if this occurs.  Also she does take higher dose of the prednisone but is not prescribed currently like this she will contact us so we can reorder meds to completed taper.  Patient verbalized understanding of plan and precautions, she was in stable condition, vital signs within normal limits, no distress when walking out of clinic this morning.  Delsa Grana, PA-C 06/29/17 11:24 AM

## 2017-06-29 NOTE — Patient Instructions (Signed)
Start taking the steroids tomorrow as prescribed in the taper For itching and spreading rash please take Benadryl, you can take 25 mg every 4-6 hours as needed for spreading rash or itching  Since you have swelling around her eye if swelling on your face around her eyes lips or airway rapidly worsens, take 50 mg of Benadryl, 40 mg of Pepcid if you have it (consider Zantac) and 60 mg of your prednisone and be seen immediately to be evaluated.  If for any reason this occurs you can call us and we will call in more of the prednisone to you can continue to taper  See the instructions below for concerning signs and symptoms of a severe or life-threatening allergic reaction.  For the areas of rash that you currently have you can put topical Benadryl or steroid cream on the areas to also help minimize itching  If you would like to avoid using Benadryl if itching rash is not severe I would take out once daily Zyrtec Claritin or Allegra to also help block your histamine reaction.     Contact Dermatitis Dermatitis is redness, soreness, and swelling (inflammation) of the skin. Contact dermatitis is a reaction to certain substances that touch the skin. There are two types of contact dermatitis:  Irritant contact dermatitis. This type is caused by something that irritates your skin, such as dry hands from washing them too much. This type does not require previous exposure to the substance for a reaction to occur. This type is more common.  Allergic contact dermatitis. This type is caused by a substance that you are allergic to, such as a nickel allergy or poison ivy. This type only occurs if you have been exposed to the substance (allergen) before. Upon a repeat exposure, your body reacts to the substance. This type is less common.  What are the causes? Many different substances can cause contact dermatitis. Irritant contact dermatitis is most commonly caused by exposure  to:  Makeup.  Soaps.  Detergents.  Bleaches.  Acids.  Metal salts, such as nickel.  Allergic contact dermatitis is most commonly caused by exposure to:  Poisonous plants.  Chemicals.  Jewelry.  Latex.  Medicines.  Preservatives in products, such as clothing.  What increases the risk? This condition is more likely to develop in:  People who have jobs that expose them to irritants or allergens.  People who have certain medical conditions, such as asthma or eczema.  What are the signs or symptoms? Symptoms of this condition may occur anywhere on your body where the irritant has touched you or is touched by you. Symptoms include:  Dryness or flaking.  Redness.  Cracks.  Itching.  Pain or a burning feeling.  Blisters.  Drainage of small amounts of blood or clear fluid from skin cracks.  With allergic contact dermatitis, there may also be swelling in areas such as the eyelids, mouth, or genitals. How is this diagnosed? This condition is diagnosed with a medical history and physical exam. A patch skin test may be performed to help determine the cause. If the condition is related to your job, you may need to see an occupational medicine specialist. How is this treated? Treatment for this condition includes figuring out what caused the reaction and protecting your skin from further contact. Treatment may also include:  Steroid creams or ointments. Oral steroid medicines may be needed in more severe cases.  Antibiotics or antibacterial ointments, if a skin infection is present.  Antihistamine lotion or an  antihistamine taken by mouth to ease itching.  A bandage (dressing).  Follow these instructions at home: [place] your skin as needed.  Apply cool compresses to the affected areas.  Try taking a bath with: ? Epsom salts. Follow the instructions on the packaging. You can get these at your local pharmacy or grocery store. ? Baking soda.  Pour a small amount into the bath as directed by your health care provider. ? Colloidal oatmeal. Follow the instructions on the packaging. You can get this at your local pharmacy or grocery store.  Try applying baking soda paste to your skin. Stir water into baking soda until it reaches a paste-like consistency.  Do not scratch your skin.  Bathe less frequently, such as every other day.  Bathe in lukewarm water. Avoid using hot water. Medicines  Take or apply over-the-counter and prescription medicines only as told by your health care provider.  If you were prescribed an antibiotic medicine, take or apply your antibiotic as told by your health care provider. Do not stop using the antibiotic even if your condition starts to improve. General instructions  Keep all follow-up visits as told by your health care provider. This is important.  Avoid the substance that caused your reaction. If you do not know what caused it, keep a journal to try to track what caused it. Write down: ? What you eat. ? What cosmetic products you use. ? What you drink. ? What you wear in the affected area. This includes jewelry.  If you were given a dressing, take care of it as told by your health care provider. This includes when to change and remove it. Contact a health care provider if:  Your condition does not improve with treatment.  Your condition gets worse.  You have signs of infection such as swelling, tenderness, redness, soreness, or warmth in the affected area.  You have a fever.  You have new symptoms. Get help right away if:  You have a severe headache, neck pain, or neck stiffness.  You vomit.  You feel very sleepy.  You notice red streaks coming from the affected area.  Your bone or joint underneath the affected area becomes painful after the skin has healed.  The affected area turns darker.  You have difficulty breathing. This information is not intended to replace advice  given to you by your health care provider. Make sure you discuss any questions you have with your health care provider. Document Released: 02/04/2000 Document Revised: 07/15/2015 Document Reviewed: 06/24/2014 Elsevier Interactive Patient Education  2018 [employer].   Anaphylactic Reaction, Adult An anaphylactic reaction (anaphylaxis) is a sudden, severe allergic reaction that affects multiple areas of the body. Affected areas of the body may include the skin, mouth, lungs, heart, or gut (digestive system). Anaphylaxis can be life-threatening. This condition requires immediate medical attention, and sometimes hospitalization. What are the causes? This condition is caused by exposure to a substance that you are allergic to (allergen). In response to this exposure, the body releases proteins (antibodies) and other compounds, such as histamine, into the bloodstream. This causes swelling in certain tissues and loss of blood pressure to important areas, such as the heart and lungs. Common allergens that can cause anaphylaxis include:  Medicines.  Foods, especially peanuts, wheat, shellfish, milk, and eggs.  Insect bites or stings.  Blood or parts of blood, including plasma, immunoglobulins, or serum.  Chemicals, such as dyes, latex, and contrast material that is used for medical tests.  What increases the risk? This condition is more likely to occur in people who:  Have allergies.  Have had anaphylaxis before.  Have a family history of anaphylaxis.  Have certain medical conditions, including asthma and eczema.  What are the signs or symptoms? Symptoms of anaphylaxis include:  Nasal congestion.  Headache.  Flushed face.  Tingling in the mouth.  An itchy, red rash.  Swelling of the eyes, lips, face, or tongue.  Swelling of the back of the mouth and the throat.  Wheezing.  A hoarse voice.  Itchy, red, swollen areas of skin (hives).  Dizziness or  light-headedness.  Fainting.  Anxiety or confusion.  Abdominal or chest pain.  Difficulty breathing, speaking, or swallowing.  Chest or throat tightness.  Fast or irregular heartbeats (palpitations).  Vomiting.  Diarrhea.  How is this diagnosed? This condition is diagnosed based on a physical exam and your history of recent exposure to allergens. You may be referred for follow-up testing by a health care provider who specializes in allergies. This testing can confirm the diagnosis and determine which substances you are allergic to. Testing may include:  Skin tests. These may involve: ? Injecting a small amount of the possible allergen between layers of your skin (intradermal injection). ? Applying patches to your skin.  Blood tests.  How is this treated? Emergency treatment may include:  Medicines that help: ? To relieve itching and hives (antihistamines). ? To reduce swelling (corticosteroids). ? To tighten your blood vessels and increase your heart rate (epinephrine).  Oxygen therapy to help you breathe.  Giving fluids through an IV tube.  Your health care provider may teach you how to use an anaphylaxis kit and how to give yourself an epinephrine injection with what is commonly called an auto-injector "pen" (pre-filled automatic epinephrine injection device). If you think that you are having an anaphylactic reaction, you should use an auto-injector pen or an anaphylaxis kit. If you use epinephrine, you must still seek emergency medical treatment. Follow these instructions at home: Safety  Always keep an auto-injector pen or an anaphylaxis kit near you. These can be lifesaving if you have a severe anaphylactic reaction. Use your auto-injector pen or anaphylaxis kit as told by your health care provider.  Do not drive until your health care provider approves.  Make sure that you, the members of your household, and your employer know: ? How to use an anaphylaxis  kit. ? How to use an auto-injector pen to give you an epinephrine injection.  Replace your epinephrine immediately after you use your auto-injector pen, in case you have another reaction.  Wear a medical alert bracelet or necklace that states your allergy, if told by your health care provider.  Learn the signs of anaphylaxis.  Work with your health care providers to come up with an anaphylaxis plan. Preparation is important. General instructions  Take over-the-counter and prescription medicines only as told by your health care provider.  If you have hives or a rash: ? Use an over-the-counter antihistamine as told by your health care provider. ? Apply cold, wet cloths (cold compresses) to your skin or take baths or showers in cool water. Avoid hot water.  If you had tests done, it is your responsibility to get your test results. Ask your health care provider or the department performing the tests when your results will be ready.  Tell any health care providers who care for you that you have an allergy.  Keep all follow-up visits as told by  your health care provider. This is important. How is this prevented?  Avoid allergens that have caused an anaphylactic reaction for you before.  When you are at a restaurant, tell your server that you have an allergy. If you are unsure whether a meal has an ingredient that you are allergic to, ask your server. Contact a health care provider if:  You develop symptoms of an allergic reaction. You may notice them soon after you are exposed to a substance. The symptoms may include: ? Rash. ? Headache. ? Sneezing or a runny nose. ? Swelling. ? Nausea. ? Diarrhea. Get help right away if:  You needed to use epinephrine. ? An epinephrine injection helps to manage life-threatening allergic reactions, but you still need to go to the emergency room even if epinephrine seems to work. This is important because anaphylaxis may happen again within 72 hours  (rebound anaphylaxis). ? If you used epinephrine to treat anaphylaxis outside of the hospital, you need additional medical care. This may include more doses of epinephrine.  You develop: ? A tight feeling in your chest or throat. ? Wheezing or difficulty breathing. ? Hives. ? Red skin or itching all over your body. ? Swelling in your lips, tongue, or the back of your throat.  You have severe vomiting or diarrhea.  You faint or you feel like you are going to faint. These symptoms may represent a serious problem that is an emergency. Do not wait to see if the symptoms will go away. Use your auto-injector pen or anaphylaxis kit as you have been instructed, and get medical help right away. Call your local emergency services (911 in the [place] Do not drive yourself to the hospital. This information is not intended to replace advice given to you by your health care provider. Make sure you discuss any questions you have with your health care provider. Document Released: 02/06/2005 Document Revised: 10/05/2015 Document Reviewed: 08/23/2015 Elsevier Interactive Patient Education  [employer].

## 2017-07-04 ENCOUNTER — Other Ambulatory Visit: Payer: Self-pay

## 2017-07-04 ENCOUNTER — Ambulatory Visit (INDEPENDENT_AMBULATORY_CARE_PROVIDER_SITE_OTHER): Payer: Medicare Other | Admitting: Physician Assistant

## 2017-07-04 ENCOUNTER — Encounter: Payer: Self-pay | Admitting: Physician Assistant

## 2017-07-04 VITALS — BP 130/80 | HR 75 | Temp 97.8°F | Resp 14 | Ht 64.0 in | Wt 152.6 lb

## 2017-07-04 DIAGNOSIS — L239 Allergic contact dermatitis, unspecified cause: Secondary | ICD-10-CM

## 2017-07-04 MED ORDER — METHYLPREDNISOLONE ACETATE 80 MG/ML IJ SUSP
80.0000 mg | Freq: Once | INTRAMUSCULAR | Status: AC
  Administered 2017-07-04: 80 mg via INTRAMUSCULAR

## 2017-07-04 NOTE — Addendum Note (Signed)
Addended by: Phineas Semen A on: 07/04/2017 10:08 AM   Modules accepted: Orders

## 2017-07-04 NOTE — Progress Notes (Signed)
Patient ID: Audrey David MRN: [id number], DOB: [birth year], 72 y.o. Date of Encounter: 07/04/2017, 9:47 AM    Chief Complaint:  Chief Complaint  Patient presents with  . rash on body    thinks its poison oak     HPI: 72 y.o. year old female presents with above.   She reports that she had a recent visit here for the same problem and was treated with an injection and that episode of itching and rash completely resolved. States that she has gotten into poison oak/poison ivy again and has a "new episode."  States that she went out working in the yard some on Sunday and then again on Monday.  States that this morning when she woke up she felt areas that were itching and then saw areas of rash.  Has area on the left ear, an area on the left cheek, area on the left arm and a vesicle on the right finger.       Home Meds:   Outpatient Medications Prior to Visit  Medication Sig Dispense Refill  . alendronate (FOSAMAX) 70 MG tablet TAKE 1 TABLET EVERY 7 DAYS WITH FULL GLASS OF WATER ON AN EMPTY STOMACH. SIT UPRIGHT FOR 30 MINUTES 12 tablet 3  . Calcium Carbonate-Vit D-Min (CALCIUM 1200) 1200-1000 MG-UNIT CHEW Chew 1,200 mg by mouth daily. 90 each 2  . Cholecalciferol (VITAMIN D3) 400 units CAPS TAKE 2 CAPSULES DAILY 180 capsule 2  . lisinopril (PRINIVIL,ZESTRIL) 10 MG tablet TAKE 1 TABLET DAILY (NO MORE REFILLS UNTIL OFFICE VISIT) 90 tablet 0  . tiZANidine (ZANAFLEX) 2 MG tablet TAKE 1 TABLET EVERY 6 HOURS AS NEEDED 90 tablet 3  . Turmeric 500 MG TABS Take 500 mg by mouth 2 (two) times daily.    Marland Kitchen zolpidem (AMBIEN CR) 12.5 MG CR tablet Take 1 tablet (12.5 mg total) by mouth at bedtime. 90 tablet 1  . predniSONE (DELTASONE) 10 MG tablet Take 40 mg x  2 days, 20 mg x 2 days, 10 mg x 2 days 14 tablet 0   No facility-administered medications prior to visit.     Allergies:  Allergies  Allergen Reactions  . Ace Inhibitors Cough  . Codeine Nausea Only    60 years ago      Review  of Systems: See HPI for pertinent ROS. All other ROS negative.    Physical Exam: Blood pressure 130/80, pulse 75, temperature 97.8 F (36.6 C), temperature source Oral, resp. rate 14, height  (1.626 m), weight 69.2 kg (152 lb 9.6 oz), SpO2 96 %., Body mass index is 26.19 kg/m. General:  WNWD WF. Appears in no acute distress. Neck: Supple. No thyromegaly. No lymphadenopathy. Lungs: Clear bilaterally to auscultation without wheezes, rales, or rhonchi. Breathing is unlabored. Heart: Regular rhythm. No murmurs, rubs, or gallops. Msk:  Strength and tone normal for age. Skin: Top of External part of left ear--- has ~ 1cm area of splotchy pink rash. Left lower cheek of face has ~ 1 cm area of splotchy pink rash. Left forearm with splotchy pink rash. Right th finger with vessicle.  Neuro: Alert and oriented X 3. Moves all extremities spontaneously. Gait is normal. CNII-XII grossly in tact. Psych:  Responds to questions appropriately with a normal affect.     ASSESSMENT AND PLAN:  72 y.o. year old female with  1. Allergic dermatitis Treat with Depo-Medrol 80 mg IM now. She also can use oral Benadryl. Follow-up if itching and rash do not resolve with this  treatment. Discussed that she needs to have someone to kill this poison oak/poison ivy so can prevent recurrence.   [address] [place], [place], [facility] 07/04/2017 9:47 AM

## 2017-07-04 NOTE — Progress Notes (Signed)
patient received depo medrol  in left ventrogluteal. Patient tolerated well

## 2017-07-10 ENCOUNTER — Other Ambulatory Visit: Payer: Self-pay | Admitting: Family Medicine

## 2017-07-10 DIAGNOSIS — Z1231 Encounter for screening mammogram for malignant neoplasm of breast: Secondary | ICD-10-CM

## 2017-07-24 ENCOUNTER — Ambulatory Visit: Payer: Medicare Other | Admitting: Family Medicine

## 2017-07-24 ENCOUNTER — Encounter: Payer: Self-pay | Admitting: Family Medicine

## 2017-07-24 VITALS — BP 100/62 | HR 74 | Temp 97.5°F | Resp 14 | Ht 64.0 in | Wt 147.6 lb

## 2017-07-24 DIAGNOSIS — S30860A Insect bite (nonvenomous) of lower back and pelvis, initial encounter: Secondary | ICD-10-CM | POA: Diagnosis not present

## 2017-07-24 DIAGNOSIS — W57XXXA Bitten or stung by nonvenomous insect and other nonvenomous arthropods, initial encounter: Secondary | ICD-10-CM | POA: Diagnosis not present

## 2017-07-24 NOTE — Patient Instructions (Addendum)
If you develop any concerning signs or symptoms of a tickborne illness please call our office and we will start you on doxycycline immediately, and then you can come in for further evaluation.  See information below and additional into the handout from CDC  You can look up the CDC tick bite patient information, specifically how to remove ticks, and went to see your doctor sections provide good information to help guide your decision making at home   Tick Bite Information, Adult Ticks are insects that can bite. Most ticks live in shrubs and grassy areas. They climb onto people and animals that go by. Then they bite. Some ticks carry germs that can make you sick. How can I prevent tick bites?  Use an insect repellent that has 20% or higher of the ingredients DEET, picaridin, or IR3535. Put this insect repellent on: ? Bare skin. ? The tops of your boots. ? Your pant legs. ? The ends of your sleeves.  If you use an insect repellent that has the ingredient permethrin, make sure to follow the instructions on the bottle. Treat the following: ? Clothing. ? Supplies. ? Boots. ? Tents.  Wear long sleeves, long pants, and light colors.  Tuck your pant legs into your socks.  Stay in the middle of the trail.  Try not to walk through long grass.  Before going inside your house, check your clothes, hair, and skin for ticks. Make sure to check your head, neck, armpits, waist, groin, and joint areas.  Check for ticks every day.  When you come indoors: ? Wash your clothes right away. ? Shower right away. ? Dry your clothes in a dryer on high heat for 60 minutes or more. What is the right way to remove a tick? Remove a tick from your skin as soon as possible.  To remove a tick that is crawling on your skin: ? Go outdoors and brush the tick off. ? Use tape or a lint roller.  To remove a tick that is biting: ? Wash your hands. ? If you have latex gloves, put them on. ? Use tweezers, curved  forceps, or a tick-removal tool to grasp the tick. Grasp the tick as close to your skin and as close to the tick's head as possible. ? Gently pull up until the tick lets go.  Try to keep the tick's head attached to its body.  Do not twist or jerk the tick.  Do not squeeze or crush the tick.  Do not try to remove a tick with heat, alcohol, petroleum jelly, or fingernail polish. How should I get rid of a tick? Here are some ways to get rid of a tick that is alive:  Place the tick in rubbing alcohol.  Place the tick in a bag or container you can close tightly.  Wrap the tick tightly in tape.  Flush the tick down the toilet.  Contact a doctor if:  You have symptoms of a disease, such as: ? Pain in a muscle, joint, or bone. ? Trouble walking or moving your legs. ? Numbness in your legs. ? Inability to move (paralysis). ? A red rash that makes a circle (bull's-eye rash). ? Redness and swelling where the tick bit you. ? A fever. ? Throwing up (vomiting) over and over. ? Diarrhea. ? Weight loss. ? Tender and swollen lymph glands. ? Shortness of breath. ? Cough. ? Belly pain (abdominal pain). ? Headache. ? Being more tired than normal. ? A change in  how alert (conscious) you are. ? Confusion. Get help right away if:  You cannot remove a tick.  A part of a tick breaks off and gets stuck in your skin.  You are feeling worse. Summary  Ticks may carry germs that can make you sick.  To prevent tick bites, wear long sleeves, long pants, and light colors. Use insect repellent. Follow the instructions on the bottle.  If the tick is biting, do not try to remove it with heat, alcohol, petroleum jelly, or fingernail polish.  Use tweezers, curved forceps, or a tick-removal tool to grasp the tick. Gently pull up until the tick lets go. Do not twist or jerk the tick. Do not squeeze or crush the tick.  If you have symptoms, contact a doctor. This information is not intended to  replace advice given to you by your health care provider. Make sure you discuss any questions you have with your health care provider. Document Released: 05/03/2009 Document Revised: 05/19/2016 Document Reviewed: 05/19/2016 Elsevier Interactive Patient Education  2018 [employer].

## 2017-07-24 NOTE — Progress Notes (Signed)
Patient ID: Audrey David, female    DOB: 04/30/1945, 72 y.o.   MRN: [id number]  PCP: Orlena Sheldon, PA-C  Chief Complaint  Patient presents with  . Tick Removal    on back     Subjective:   Audrey David is a 72 y.o. female, presents to clinic with CC of tick bite that was located to her right upper back, it was removed 2 days ago by her son who noticed it when she was in a swimming suit.  He removed in its entirety with just the use of his hands, they did take a picture of it before hand on her cell phone.  The area around the tick when it was on her was erythematous.  She dumped alcohol down her back right after it was removed.  She was told to come in immediately for evaluation.  She has no new symptoms.  She has not been having any irritation on her back, no rash, swelling, itching.  She denies any fever, chills, sweats, sore throat, neck pain or stiffness, headaches, nausea, vomiting, chest pain, shortness of breath, joint pain or swelling.   Patient Active Problem List   Diagnosis Date Noted  . Osteopenia 03/01/2016  . Back pain 12/02/2014  . 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 Arthritis   . Hypertension   . Insomnia   . Internal hemorrhoids without mention of complication 36/14/4315  . Special screening for malignant neoplasms, colon 08/08/2011     Prior to Admission medications   Medication Sig Start Date End Date Taking? Authorizing Provider  alendronate (FOSAMAX) 70 MG tablet TAKE 1 TABLET EVERY 7 DAYS WITH FULL GLASS OF WATER ON AN EMPTY STOMACH. SIT UPRIGHT FOR 30 MINUTES 01/25/17  Yes Orlena Sheldon, PA-C  Calcium Carbonate-Vit D-Min (CALCIUM 1200) 1200-1000 MG-UNIT CHEW Chew 1,200 mg by mouth daily. 03/16/16  Yes Dena Billet B, PA-C  Cholecalciferol (VITAMIN D3) 400 units CAPS TAKE 2 CAPSULES DAILY 11/27/16  Yes Dena Billet B, PA-C  lisinopril (PRINIVIL,ZESTRIL) 10 MG tablet TAKE 1 TABLET DAILY (NO MORE REFILLS UNTIL OFFICE VISIT) 05/01/17  Yes Dena Billet B, PA-C    tiZANidine (ZANAFLEX) 2 MG tablet TAKE 1 TABLET EVERY 6 HOURS AS NEEDED 02/15/17  Yes Dena Billet B, PA-C  Turmeric 500 MG TABS Take 500 mg by mouth 2 (two) times daily.   Yes [provider]  zolpidem (AMBIEN CR) 12.5 MG CR tablet Take 1 tablet (12.5 mg total) by mouth at bedtime. 05/14/17  Yes Orlena Sheldon, PA-C     Allergies  Allergen Reactions  . Ace Inhibitors Cough  . Codeine Nausea Only    60 years ago     Family History  Problem Relation Age of Onset  . Hypertension Mother   . Cancer Father 73       multiple myeloma. Throat Cancer.     Social History   Socioeconomic History  . Marital status: Single    Spouse name: Not on file  . Number of children: Not on file  . Years of education: Not on file  . Highest education level: Not on file  Occupational History  . Not on file  Social Needs  . Financial resource strain: Not on file  . Food insecurity:    Worry: Not on file    Inability: Not on file  . Transportation needs:    Medical: Not on file    Non-medical: Not on file  Tobacco Use  . Smoking status:  Former Smoker  . Smokeless tobacco: Never Used  . Tobacco comment:  " quit smoking cigarettes in 2007 "  Substance and Sexual Activity  . Alcohol use: Yes    Comment: Social   . Drug use: No  . Sexual activity: Not on file  Lifestyle  . Physical activity:    Days per week: Not on file    Minutes per session: Not on file  . Stress: Not on file  Relationships  . Social connections:    Talks on phone: Not on file    Gets together: Not on file    Attends religious service: Not on file    Active member of club or organization: Not on file    Attends meetings of clubs or organizations: Not on file    Relationship status: Not on file  . Intimate partner violence:    Fear of current or ex partner: Not on file    Emotionally abused: Not on file    Physically abused: Not on file    Forced sexual activity: Not on file  Other Topics Concern  . Not  on file  Social History Narrative   Daily caffeine      Review of Systems  All other systems reviewed and are negative.      Objective:    Vitals:   07/24/17 0922  BP: 100/62  Pulse: 74  Resp: 14  Temp: (!) 97.5 F (36.4 C)  TempSrc: Oral  SpO2: 95%  Weight: 147 lb 9.6 oz (67 kg)  Height: '5\' 4"'$  (1.626 m)      Physical Exam  Constitutional: She appears well-developed and well-nourished. No distress.  HENT:  Head: Normocephalic and atraumatic.  Right Ear: External ear normal.  Left Ear: External ear normal.  Nose: Nose normal.  Mouth/Throat: Oropharynx is clear and moist. No oropharyngeal exudate.  Eyes: Pupils are equal, round, and reactive to light. Conjunctivae are normal. Right eye exhibits no discharge. Left eye exhibits no discharge.  Neck: Trachea normal, normal range of motion, full passive range of motion without pain and phonation normal. Neck supple. No JVD present. No spinous process tenderness and no muscular tenderness present. No neck rigidity. No tracheal deviation, no edema, no erythema and normal range of motion present.  Cardiovascular: Normal rate, regular rhythm, normal heart sounds and intact distal pulses. Exam reveals no gallop and no friction rub.  No murmur heard. Pulmonary/Chest: Effort normal and breath sounds normal. No stridor. No respiratory distress. She has no wheezes. She has no rales. She exhibits no tenderness.  Abdominal: Soft. Bowel sounds are normal. She exhibits no distension. There is no tenderness.  Musculoskeletal: Normal range of motion.  Lymphadenopathy:    She has no cervical adenopathy.  Neurological: She is alert. She exhibits normal muscle tone. Coordination normal.  Skin: Skin is warm and dry. Capillary refill takes less than 2 seconds. No rash noted. She is not diaphoretic.  Small erythematous 1 cm papule inferior to right scapula, no surrounding rash, edema, erythema, induration.  Skin intact, no petechia, ecchymosis,  excoriations  Psychiatric: She has a normal mood and affect. Her behavior is normal.  Nursing note and vitals reviewed.         Assessment & Plan:      ICD-10-CM   1. Tick bite of back, initial encounter S30.860A    W57.XXXA     Tick bite removed from patient's back 2 days ago, small papule on her back, but she is not even sure if  that is where the tick was removed from, she is otherwise asymptomatic.  Reviewed turning signs and symptoms which to start doxycycline immediately, she will call us if she has any new symptoms.  Information printed and reviewed from CDC, also discharge instructions provided and reviewed with patient who verbalized understanding.  Delsa Grana, PA-C 07/24/17 9:37 AM

## 2017-07-30 ENCOUNTER — Other Ambulatory Visit: Payer: Self-pay | Admitting: Physician Assistant

## 2017-08-16 ENCOUNTER — Ambulatory Visit
Admission: RE | Admit: 2017-08-16 | Discharge: 2017-08-16 | Disposition: A | Discharge status: Home / Self Care | Payer: Medicare Other | Source: Ambulatory Visit | Attending: Family Medicine | Admitting: Family Medicine

## 2017-08-16 ENCOUNTER — Other Ambulatory Visit: Payer: Self-pay | Admitting: Family Medicine

## 2017-08-16 DIAGNOSIS — Z1231 Encounter for screening mammogram for malignant neoplasm of breast: Secondary | ICD-10-CM

## 2017-08-16 DIAGNOSIS — N63 Unspecified lump in unspecified breast: Secondary | ICD-10-CM

## 2017-08-21 ENCOUNTER — Ambulatory Visit: Payer: Medicare Other | Admitting: Family Medicine

## 2017-08-21 ENCOUNTER — Encounter: Payer: Self-pay | Admitting: Family Medicine

## 2017-08-21 ENCOUNTER — Ambulatory Visit: Payer: Medicare Other

## 2017-08-21 VITALS — BP 108/80 | HR 77 | Temp 98.3°F | Ht 64.0 in | Wt 146.8 lb

## 2017-08-21 DIAGNOSIS — J869 Pyothorax without fistula: Secondary | ICD-10-CM

## 2017-08-21 MED ORDER — SULFAMETHOXAZOLE-TRIMETHOPRIM 800-160 MG PO TABS: 1.0000 | ORAL_TABLET | Freq: Two times a day (BID) | ORAL | 0 refills | Status: AC

## 2017-08-21 NOTE — Progress Notes (Signed)
Patient ID: Audrey David, female    DOB: Feb 16, 1946, 72 y.o.   MRN: [id number]  PCP: Orlena Sheldon, PA-C  Chief Complaint  Patient presents with  . cyst on chest    Subjective:   Audrey David is a 72 y.o. female, presents to clinic with CC of 1 week of worsening swelling and redness to right upper chest which began after she started "mashing on" a small bump that had been on her chest for several months w/o irritation.  She has done warm and hot soaks and there has been some purulent drainage with blood.  Pain is moderate to severe, much worse with any light touch palpation.  Start is a very small flesh-colored bump less than half a centimeter in size and now is currently large swollen area at least 4 x 4 cm wide with redness in that area multiple pustules.  No surrounding edema erythema or tenderness of the skin.  She denies any fever, chills, sweats.  No history of immunocompromise, poor wound healing.   Patient Active Problem List   Diagnosis Date Noted  . Osteopenia 03/01/2016  . Back pain 12/02/2014  . 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 Arthritis   . Hypertension   . Insomnia   . Internal hemorrhoids without mention of complication 69/79/4801  . Special screening for malignant neoplasms, colon 08/08/2011     Prior to Admission medications   Medication Sig Start Date End Date Taking? Authorizing Provider  alendronate (FOSAMAX) 70 MG tablet TAKE 1 TABLET EVERY 7 DAYS WITH FULL GLASS OF WATER ON AN EMPTY STOMACH. SIT UPRIGHT FOR 30 MINUTES 01/25/17  Yes Orlena Sheldon, PA-C  Calcium Carbonate-Vit D-Min (CALCIUM 1200) 1200-1000 MG-UNIT CHEW Chew 1,200 mg by mouth daily. 03/16/16  Yes Dena Billet B, PA-C  Cholecalciferol (VITAMIN D3) 400 units CAPS TAKE 2 CAPSULES DAILY 11/27/16  Yes Dena Billet B, PA-C  lisinopril (PRINIVIL,ZESTRIL) 10 MG tablet TAKE 1 TABLET DAILY (NO MORE REFILLS UNTIL OFFICE VISIT) 07/30/17  Yes Dena Billet B, PA-C  tiZANidine (ZANAFLEX) 2 MG tablet TAKE 1 TABLET  EVERY 6 HOURS AS NEEDED 02/15/17  Yes Dena Billet B, PA-C  Turmeric 500 MG TABS Take 500 mg by mouth 2 (two) times daily.   Yes [provider]  zolpidem (AMBIEN CR) 12.5 MG CR tablet Take 1 tablet (12.5 mg total) by mouth at bedtime. 05/14/17  Yes Orlena Sheldon, PA-C     Allergies  Allergen Reactions  . Ace Inhibitors Cough  . Codeine Nausea Only    60 years ago     Family History  Problem Relation Age of Onset  . Hypertension Mother   . Cancer Father 39       multiple myeloma. Throat Cancer.     Social History   Socioeconomic History  . Marital status: Single    Spouse name: Not on file  . Number of children: Not on file  . Years of education: Not on file  . Highest education level: Not on file  Occupational History  . Not on file  Social Needs  . Financial resource strain: Not on file  . Food insecurity:    Worry: Not on file    Inability: Not on file  . Transportation needs:    Medical: Not on file    Non-medical: Not on file  Tobacco Use  . Smoking status: Former Research scientist (life sciences)  . Smokeless tobacco: Never Used  . Tobacco comment:  " quit smoking cigarettes in 2007 "  Substance and Sexual Activity  . Alcohol use: Yes    Comment: Social   . Drug use: No  . Sexual activity: Not on file  Lifestyle  . Physical activity:    Days per week: Not on file    Minutes per session: Not on file  . Stress: Not on file  Relationships  . Social connections:    Talks on phone: Not on file    Gets together: Not on file    Attends religious service: Not on file    Active member of club or organization: Not on file    Attends meetings of clubs or organizations: Not on file    Relationship status: Not on file  . Intimate partner violence:    Fear of current or ex partner: Not on file    Emotionally abused: Not on file    Physically abused: Not on file    Forced sexual activity: Not on file  Other Topics Concern  . Not on file  Social History Narrative   Daily  caffeine      Review of Systems  Constitutional: Negative.  Negative for activity change, appetite change, chills, diaphoresis, fatigue and fever.  HENT: Negative.   Respiratory: Negative.   Cardiovascular: Negative.   Gastrointestinal: Negative.   Genitourinary: Negative.   Musculoskeletal: Negative.   Skin: Positive for color change. Negative for pallor and rash.  Allergic/Immunologic: Negative.  Negative for immunocompromised state.  Hematological: Negative.  Negative for adenopathy. Does not bruise/bleed easily.  Psychiatric/Behavioral: Negative.   All other systems reviewed and are negative.      Objective:    Vitals:   08/21/17 0946  BP: 108/80  Pulse: 77  Temp: 98.3 F (36.8 C)  TempSrc: Oral  SpO2: 94%  Weight: 146 lb 12.8 oz (66.6 kg)  Height: _0  (1.626 m)      Physical Exam  Constitutional: She appears well-developed and well-nourished. No distress.  HENT:  Head: Normocephalic and atraumatic.  Nose: Nose normal.  Eyes: Conjunctivae are normal. Right eye exhibits no discharge. Left eye exhibits no discharge.  Neck: No tracheal deviation present.  Cardiovascular: Normal rate and regular rhythm.  Pulmonary/Chest: Effort normal. No stridor. No respiratory distress.  Musculoskeletal: Normal range of motion.  Neurological: She is alert. She exhibits normal muscle tone. Coordination normal.  Skin: Skin is warm and dry. No rash noted. She is not diaphoretic. There is erythema.  6x4 cm area of edema, erythema and induration with central 1x1.5 cm area of mild fluctuance with one large pustule and two other smaller pustules, very ttp, no current drainage  Psychiatric: She has a normal mood and affect. Her behavior is normal.  Nursing note and vitals reviewed.      I & D Date/Time: 08/21/2017 10:20 AM Performed by: Delsa Grana, PA-C Authorized by: Delsa Grana, PA-C   Consent:    Consent obtained:  Verbal   Consent given by:  Patient   Risks discussed:   Bleeding, infection, incomplete drainage and pain (scarring on upper chest) Location:    Type:  Abscess   Size:  4x3 cm   Location:  Trunk   Trunk location:  Chest (Right upper sternal border 3 ICS) Pre-procedure details:    Skin preparation:  Alcohol and Betadine Sedation:    Sedation type: none. Anesthesia (see MAR for exact dosages):    Anesthesia method:  Local infiltration   Local anesthetic:  Lidocaine 2% WITH epi (1 mL) Procedure type:    Complexity:  Complex (multiple pustule locations on abscess 2 opened with procedure) Procedure details:    Needle aspiration: no     Incision types:  Stab incision   Incision depth:  Dermal   Scalpel blade:  11   Wound management:  Probed and deloculated   Drainage:  Purulent and bloody   Drainage amount:  Moderate   Wound treatment:  Wound left open   Packing materials:  None Post-procedure details:    Patient tolerance of procedure:  Tolerated well, no immediate complications      Assessment & Plan:      ICD-10-CM   1. Abscess of chest (HCC) J86.9     Abscess of right upper chest with multiple pustules, attempted to use first topical anesthetic, but pt required local infiltrate.  Two small stab incision and moderate amount of purulent discharge.  Given location did not deeply explore wound.  Feel there was a lot of cellulitis in the 4x3cm area of swelling and erythema to chest wall and only 1-2 cm area of fluctuance and abscess.  Instructed to continue warm soaks, allow drainage, keep covered with non-adherent gauze, will tx cellulitis with abx for 5 days.  Ideally would like to recheck in 2 3 days but with 4 July holiday, patient will present early next week for recheck, 6 to 7 days after I&D today.  Concerning signs and symptoms of worsening infection were reviewed and patient was urged to go to urgent care or call after hours pager if she has any concerning symptoms.  Patient verbalized understanding.   Delsa Grana, PA-C 08/21/17  10:12 AM

## 2017-08-21 NOTE — Patient Instructions (Signed)
Keep bandage over wound, do warm soaks 2 x a day.  Do not mash on it.  Recheck in 2-3 days.   Skin Abscess A skin abscess is an infected area on or under your skin that contains pus and other material. An abscess can happen almost anywhere on your body. Some abscesses break open (rupture) on their own. Most continue to get worse unless they are treated. The infection can spread deeper into the body and into your blood, which can make you feel sick. Treatment usually involves draining the abscess. Follow these instructions at home: Abscess Care  If you have an abscess that has not drained, place a warm, clean, wet washcloth over the abscess several times a day. Do this as told by your doctor.  Follow instructions from your doctor about how to take care of your abscess. Make sure you: ? Cover the abscess with a bandage (dressing). ? Change your bandage or gauze as told by your doctor. ? Wash your hands with soap and water before you change the bandage or gauze. If you cannot use soap and water, use hand sanitizer.  Check your abscess every day for signs that the infection is getting worse. Check for: ? More redness, swelling, or pain. ? More fluid or blood. ? Warmth. ? More pus or a bad smell. Medicines   Take over-the-counter and prescription medicines only as told by your doctor.  If you were prescribed an antibiotic medicine, take it as told by your doctor. Do not stop taking the antibiotic even if you start to feel better. General instructions  To avoid spreading the infection: ? Do not share personal care items, towels, or hot tubs with others. ? Avoid making skin-to-skin contact with other people.  Keep all follow-up visits as told by your doctor. This is important. Contact a doctor if:  You have more redness, swelling, or pain around your abscess.  You have more fluid or blood coming from your abscess.  Your abscess feels warm when you touch it.  You have more pus or a  bad smell coming from your abscess.  You have a fever.  Your muscles ache.  You have chills.  You feel sick. Get help right away if:  You have very bad (severe) pain.  You see red streaks on your skin spreading away from the abscess. This information is not intended to replace advice given to you by your health care provider. Make sure you discuss any questions you have with your health care provider. Document Released: 07/26/2007 Document Revised: 10/03/2015 Document Reviewed: 12/16/2014 Elsevier Interactive Patient Education  [employer]2018 [employer]

## 2017-08-27 ENCOUNTER — Other Ambulatory Visit: Payer: Self-pay | Admitting: Physician Assistant

## 2017-08-28 ENCOUNTER — Ambulatory Visit: Payer: Medicare Other | Admitting: Family Medicine

## 2017-08-30 ENCOUNTER — Encounter: Payer: Self-pay | Admitting: Physician Assistant

## 2017-10-04 ENCOUNTER — Ambulatory Visit
Admission: RE | Admit: 2017-10-04 | Discharge: 2017-10-04 | Disposition: A | Discharge status: Home / Self Care | Payer: Medicare Other | Source: Ambulatory Visit | Attending: Family Medicine | Admitting: Family Medicine

## 2017-10-04 ENCOUNTER — Ambulatory Visit: Payer: Medicare Other

## 2017-10-04 DIAGNOSIS — N63 Unspecified lump in unspecified breast: Secondary | ICD-10-CM

## 2017-10-10 ENCOUNTER — Encounter: Payer: Self-pay | Admitting: Physician Assistant

## 2017-10-10 ENCOUNTER — Other Ambulatory Visit: Payer: Self-pay

## 2017-10-10 ENCOUNTER — Ambulatory Visit (INDEPENDENT_AMBULATORY_CARE_PROVIDER_SITE_OTHER): Payer: Medicare Other | Admitting: Physician Assistant

## 2017-10-10 VITALS — BP 112/62 | HR 78 | Temp 98.0°F | Resp 18 | Ht 64.0 in | Wt 151.4 lb

## 2017-10-10 DIAGNOSIS — K649 Unspecified hemorrhoids: Secondary | ICD-10-CM

## 2017-10-10 DIAGNOSIS — I10 Essential (primary) hypertension: Secondary | ICD-10-CM

## 2017-10-10 DIAGNOSIS — L239 Allergic contact dermatitis, unspecified cause: Secondary | ICD-10-CM

## 2017-10-10 MED ORDER — METHYLPREDNISOLONE ACETATE 80 MG/ML IJ SUSP
80.0000 mg | Freq: Once | INTRAMUSCULAR | Status: AC
  Administered 2017-10-10: 80 mg via INTRAMUSCULAR

## 2017-10-10 MED ORDER — PREDNISONE 20 MG PO TABS: ORAL_TABLET | ORAL | 0 refills | Status: DC

## 2017-10-10 MED ORDER — AMLODIPINE BESYLATE 5 MG PO TABS: 5.0000 mg | ORAL_TABLET | Freq: Every day | ORAL | 11 refills | Status: DC

## 2017-10-10 NOTE — Progress Notes (Signed)
Patient received depo medrol 80 mg while in office. Patient received in right ventrogluteal patient tolerated well.

## 2017-10-10 NOTE — Addendum Note (Signed)
Addended by: Phineas SemenJOHNSON, TIFFANY A on: 10/10/2017 03:52 PM   Modules accepted: Orders

## 2017-10-10 NOTE — Progress Notes (Signed)
Patient ID: Ginia Rudell MRN: [id number], DOB: Jan 11, 1946, 72 y.o. Date of Encounter: '@DATE'$ @  Chief Complaint:  Chief Complaint  Patient presents with  . Poison Ivy  . uncomfortable area around anus  . dry cough    discuss blood pressure medicine    HPI: 72 y.o. year old female  presents with above.   Reports that she has gotten poison oak/poison ivy breakout again.  Says that she has 1-1/2 acres to care for.  Says that she recently went out there and had started weeding etc. before she realized thinking about putting on gloves.  Now has itchy rash.  Vesicles on fingers and arms.  Patchy itchy rash on right ear above the right eye and then an area down next to the base of her nose on the right cheek.   So far, is having no rash on her lower body.  Also states that someone recently came to do "house call" from her insurance.  Noted that she is having a chronic dry hacking cough.  Noted that she is on lisinopril and felt that she should see about getting that medication changed.  Also this morning noticed what she thinks may be a hemorrhoid.  Noticed an irritated mass on the side of her anal region. States that she has never had a colonoscopy.  States that she has done Cologuard.  Has never seen a GI doctor.  She has no other specific concerns to address today.   Past Medical History:  Diagnosis Date  . Arthritis   . Detached retina   . Hemorrhoids   . Hypertension   . Insomnia   . Phlebitis      Home Meds: Outpatient Medications Prior to Visit  Medication Sig Dispense Refill  . alendronate (FOSAMAX) 70 MG tablet TAKE 1 TABLET EVERY 7 DAYS WITH FULL GLASS OF WATER ON AN EMPTY STOMACH. SIT UPRIGHT FOR 30 MINUTES 12 tablet 3  . Calcium Carbonate-Vit D-Min (CALCIUM 1200) 1200-1000 MG-UNIT CHEW Chew 1,200 mg by mouth daily. 90 each 2  . Cholecalciferol (VITAMIN D3) 400 units CAPS TAKE 2 CAPSULES DAILY 180 capsule 2  . tiZANidine (ZANAFLEX) 2 MG tablet TAKE 1 TABLET EVERY 6  HOURS AS NEEDED 90 tablet 3  . Turmeric 500 MG TABS Take 500 mg by mouth 2 (two) times daily.    Marland Kitchen zolpidem (AMBIEN CR) 12.5 MG CR tablet Take 1 tablet (12.5 mg total) by mouth at bedtime. 90 tablet 1  . lisinopril (PRINIVIL,ZESTRIL) 10 MG tablet TAKE 1 TABLET DAILY (NO MORE REFILLS UNTIL OFFICE VISIT) 90 tablet 0   No facility-administered medications prior to visit.     Allergies:  Allergies  Allergen Reactions  . Ace Inhibitors Cough  . Codeine Nausea Only    60 years ago    Social History   Socioeconomic History  . Marital status: Single    Spouse name: Not on file  . Number of children: Not on file  . Years of education: Not on file  . Highest education level: Not on file  Occupational History  . Not on file  Social Needs  . Financial resource strain: Not on file  . Food insecurity:    Worry: Not on file    Inability: Not on file  . Transportation needs:    Medical: Not on file    Non-medical: Not on file  Tobacco Use  . Smoking status: Former Research scientist (life sciences)  . Smokeless tobacco: Never Used  . Tobacco comment:  " quit  smoking cigarettes in 2007 "  Substance and Sexual Activity  . Alcohol use: Yes    Comment: Social   . Drug use: No  . Sexual activity: Not on file  Lifestyle  . Physical activity:    Days per week: Not on file    Minutes per session: Not on file  . Stress: Not on file  Relationships  . Social connections:    Talks on phone: Not on file    Gets together: Not on file    Attends religious service: Not on file    Active member of club or organization: Not on file    Attends meetings of clubs or organizations: Not on file    Relationship status: Not on file  . Intimate partner violence:    Fear of current or ex partner: Not on file    Emotionally abused: Not on file    Physically abused: Not on file    Forced sexual activity: Not on file  Other Topics Concern  . Not on file  Social History Narrative   Daily caffeine     Family History    Problem Relation Age of Onset  . Hypertension Mother   . Cancer Father 38       multiple myeloma. Throat Cancer.  . Breast cancer Neg Hx      Review of Systems:  See HPI for pertinent ROS. All other ROS negative.    Physical Exam: Blood pressure 112/62, pulse 78, temperature 98 F (36.7 C), temperature source Oral, resp. rate 18, height '5\' 4"'$  (1.626 m), weight 68.7 kg, SpO2 94 %., Body mass index is 25.99 kg/m. General: WNWD WF Appears in no acute distress.  Neck: Supple. No thyromegaly. No lymphadenopathy. Lungs: Clear bilaterally to auscultation without wheezes, rales, or rhonchi. Breathing is unlabored. Heart: RRR with S1 S2. No murmurs, rubs, or gallops. Musculoskeletal:  Strength and tone normal for age. Extremities/Skin:  He has vesicles present on fingers and a few vesicles on arms.  Also has papules on arms.  Has small area of splotchy rash on right upper eyelid.  So has a small splotchy area of rash on her cheek down at the bottom of her nose.  Anal: She has a large hemorrhoid on left side of anus.  Approximate 1 cm diameter.  Color is pink.  No cyanosis.  No necrosis. Neuro: Alert and oriented X 3. Moves all extremities spontaneously. Gait is normal. CNII-XII grossly in tact. Psych:  Responds to questions appropriately with a normal affect.     ASSESSMENT AND PLAN:  72 y.o. year old female with    1. Allergic dermatitis She states that in the past we have given her an injection that she then has to also get oral prednisone to follow. Therefore, will give Depo-Medrol 80 mg here today.  She will then start oral prednisone taper tomorrow. - predniSONE (DELTASONE) 20 MG tablet; Take 3 daily for 2 days, then 2 daily for 2 days, then 1 daily for 2 days.  Dispense: 12 tablet; Refill: 0  2. Essential hypertension Will discontinue lisinopril as this is because of dry hacking cough.  Will replace with Norvasc 5 mg. - amLODipine (NORVASC) 5 MG tablet; Take 1 tablet (5 mg  total) by mouth daily.  Dispense: 30 tablet; Refill: 11  3. Acute hemorrhoid Depo-Medrol and prednisone should help with this hemorrhoid inflammation.  Also she can apply over-the-counter treatments.  As well I will place referral to GI for follow-up with GI if  needed. - Ambulatory referral to Gastroenterology   Signed, Olean Ree [place], [place], [facility] 10/10/2017 10:39 AM

## 2017-10-19 ENCOUNTER — Other Ambulatory Visit: Payer: Self-pay

## 2017-10-19 ENCOUNTER — Other Ambulatory Visit: Payer: Self-pay | Admitting: Physician Assistant

## 2017-10-19 NOTE — Telephone Encounter (Signed)
Last OV  09/20/2016 Last refill 05/14/2017 Ok to refill

## 2017-10-23 LAB — FECAL OCCULT BLOOD, IMMUNOCHEMICAL: IFOBT: NEGATIVE

## 2017-10-23 MED ORDER — ZOLPIDEM TARTRATE ER 12.5 MG PO TBCR: 12.5000 mg | EXTENDED_RELEASE_TABLET | Freq: Every day | ORAL | 1 refills | Status: DC

## 2017-10-25 ENCOUNTER — Other Ambulatory Visit: Payer: Self-pay

## 2017-12-24 ENCOUNTER — Ambulatory Visit: Payer: Medicare Other

## 2017-12-24 DIAGNOSIS — I10 Essential (primary) hypertension: Secondary | ICD-10-CM

## 2017-12-24 NOTE — Progress Notes (Signed)
Patient was in office for blood pressure check. At home patient states her systolic 130's, diastolic 90 and below. When patient was in office her blood pressure was 118/62. Patient states she has been having headaches everyday and she has had swelling in both  her ankles as well as her fingers. Patient was advised to drink plenty of water try and move around and not sit for long periods of time when traveling to help with ankle swelling. + An appointment was made for patient to be seen 12/25/2017 she is going out of town on 11/6. Patient was also informed to go to Urgent Care if symptoms got worse.Patient verbalized understanding

## 2017-12-25 ENCOUNTER — Ambulatory Visit (INDEPENDENT_AMBULATORY_CARE_PROVIDER_SITE_OTHER): Payer: Medicare Other | Admitting: Family Medicine

## 2017-12-25 ENCOUNTER — Encounter: Payer: Self-pay | Admitting: Family Medicine

## 2017-12-25 ENCOUNTER — Telehealth: Payer: Self-pay | Admitting: *Deleted

## 2017-12-25 VITALS — BP 110/70 | HR 92 | Temp 98.4°F | Resp 16 | Ht 64.0 in | Wt 154.0 lb

## 2017-12-25 DIAGNOSIS — R609 Edema, unspecified: Secondary | ICD-10-CM

## 2017-12-25 DIAGNOSIS — G4440 Drug-induced headache, not elsewhere classified, not intractable: Secondary | ICD-10-CM

## 2017-12-25 MED ORDER — LOSARTAN POTASSIUM 50 MG PO TABS: 50.0000 mg | ORAL_TABLET | Freq: Every day | ORAL | 3 refills | Status: DC

## 2017-12-25 MED ORDER — CYCLOBENZAPRINE HCL 5 MG PO TABS: 5.0000 mg | ORAL_TABLET | Freq: Three times a day (TID) | ORAL | 1 refills | Status: DC | PRN

## 2017-12-25 NOTE — Telephone Encounter (Signed)
Received request from pharmacy for PA on Flexeril.   PA submitted.   Dx: [id number]

## 2017-12-25 NOTE — Telephone Encounter (Signed)
[facility] is reviewing your PA request and will respond within 24 hours for Medicaid or up to 72 hours for non-Medicaid plans, based on the required timeframe determined by state or federal regulations. To check for an update later, open this request from your dashboard.

## 2017-12-25 NOTE — Progress Notes (Signed)
Subjective:    Patient ID: Audrey David, female    DOB: [birth year], 72 y.o.   MRN: [id number]  HPI  In early September, the patient's lisinopril was switched to amlodipine due to a cough.  Recently she went on a long car ride to [place].  She noticed pitting edema in both ankles.  The pitting edema has since resolved.  However on examination today she has several varicose veins around the medial and lateral malleoli bilaterally.  There is no pitting edema however the varicose veins extend up onto her anterior shins.  She denies any chest pain shortness of breath or dyspnea on exertion.  Recently however she has been having a daily headache.  The headache is frontal in location.  It runs in a bandlike pattern from temple to temple.  It responds to ibuprofen.  In fact she states she has been taking ibuprofen for headache now for several months every day.  She denies any vision changes.  She denies any photophobia or phonophobia.  She denies any nausea or vomiting or neurologic deficits.  Eyes any sinus pain or rhinorrhea.  There is no tenderness to palpation of the temporal artery bilaterally. Past Medical History:  Diagnosis Date  . Arthritis   . Detached retina   . Hemorrhoids   . Hypertension   . Insomnia   . Phlebitis    Past Surgical History:  Procedure Laterality Date  . ABDOMINAL HYSTERECTOMY    . COLONOSCOPY    . TUBAL LIGATION     Current Outpatient Medications on File Prior to Visit  Medication Sig Dispense Refill  . alendronate (FOSAMAX) 70 MG tablet TAKE 1 TABLET EVERY 7 DAYS WITH FULL GLASS OF WATER ON AN EMPTY STOMACH. SIT UPRIGHT FOR 30 MINUTES 12 tablet 3  . amLODipine (NORVASC) 5 MG tablet Take 1 tablet (5 mg total) by mouth daily. 30 tablet 11  . Calcium Carbonate-Vit D-Min (CALCIUM 1200) 1200-1000 MG-UNIT CHEW Chew 1,200 mg by mouth daily. 90 each 2  . Cholecalciferol (VITAMIN D3) 400 units CAPS TAKE 2 CAPSULES DAILY 180 capsule 2  . tiZANidine (ZANAFLEX) 2 MG  tablet TAKE 1 TABLET EVERY 6 HOURS AS NEEDED 90 tablet 3  . Turmeric 500 MG TABS Take 500 mg by mouth 2 (two) times daily.    Marland Kitchen zolpidem (AMBIEN CR) 12.5 MG CR tablet Take 1 tablet (12.5 mg total) by mouth at bedtime. 90 tablet 1   No current facility-administered medications on file prior to visit.    Allergies  Allergen Reactions  . Ace Inhibitors Cough  . Codeine Nausea Only    60 years ago   Social History   Socioeconomic History  . Marital status: Single    Spouse name: Not on file  . Number of children: Not on file  . Years of education: Not on file  . Highest education level: Not on file  Occupational History  . Not on file  Social Needs  . Financial resource strain: Not on file  . Food insecurity:    Worry: Not on file    Inability: Not on file  . Transportation needs:    Medical: Not on file    Non-medical: Not on file  Tobacco Use  . Smoking status: Former Games developer  . Smokeless tobacco: Never Used  . Tobacco comment:  " quit smoking cigarettes in 2007 "  Substance and Sexual Activity  . Alcohol use: Yes    Comment: Social   . Drug use: No  .  Sexual activity: Not on file  Lifestyle  . Physical activity:    Days per week: Not on file    Minutes per session: Not on file  . Stress: Not on file  Relationships  . Social connections:    Talks on phone: Not on file    Gets together: Not on file    Attends religious service: Not on file    Active member of club or organization: Not on file    Attends meetings of clubs or organizations: Not on file    Relationship status: Not on file  . Intimate partner violence:    Fear of current or ex partner: Not on file    Emotionally abused: Not on file    Physically abused: Not on file    Forced sexual activity: Not on file  Other Topics Concern  . Not on file  Social History Narrative   Daily caffeine      Review of Systems  All other systems reviewed and are negative.      Objective:   Physical Exam    Constitutional: She is oriented to person, place, and time. She appears well-developed and well-nourished.  Non-toxic appearance. She does not appear ill. No distress.  HENT:  Head: Normocephalic and atraumatic.  Mouth/Throat: Oropharynx is clear and moist.  Eyes: Pupils are equal, round, and reactive to light. EOM are normal.  Cardiovascular: Normal rate, regular rhythm, normal heart sounds and intact distal pulses.  No murmur heard. Pulmonary/Chest: Effort normal and breath sounds normal. No respiratory distress. She has no wheezes.  Abdominal: Soft. Bowel sounds are normal.  Neurological: She is alert and oriented to person, place, and time. She has normal strength. She is not disoriented. No cranial nerve deficit or sensory deficit. Coordination and gait normal.  Vitals reviewed.  NO Papilledema on funduscopic exam       Assessment & Plan:  Rebound headache - Plan: Sedimentation rate, COMPLETE METABOLIC PANEL WITH GFR, CBC with Differential/Platelet  Peripheral edema  I believe the patient is experiencing rebound headaches secondary to NSAID overuse as well as tension headache.  I recommended discontinuation of all NSAIDs and supplementing with Flexeril 5 to 10 mg every 8 hours as needed for headache.  Anticipate the headache should gradually improve over the next 2 to 3 weeks.  I believe the peripheral edema is likely a combination of amlodipine coupled with chronic venous insufficiency coupled with sitting for prolonged period of time driving.  It is since resolved.  We will discontinue amlodipine and replace with losartan 50 mg a day.  Recheck in 2 weeks if headaches are not better or sooner if worse

## 2017-12-26 LAB — COMPLETE METABOLIC PANEL WITH GFR
AG Ratio: 1.9 (calc) (ref 1.0–2.5)
ALT: 18 U/L (ref 6–29)
AST: 16 U/L (ref 10–35)
Albumin: 4.1 g/dL (ref 3.6–5.1)
Alkaline phosphatase (APISO): 75 U/L (ref 33–130)
BUN/Creatinine Ratio: 18 (calc) (ref 6–22)
BUN: 17 mg/dL (ref 7–25)
CO2: 28 mmol/L (ref 20–32)
Calcium: 9.1 mg/dL (ref 8.6–10.4)
Chloride: 106 mmol/L (ref 98–110)
Creat: 0.94 mg/dL — ABNORMAL HIGH (ref 0.60–0.93)
GFR, Est African American: 70 mL/min/{1.73_m2} (ref >=60)
GFR, Est Non African American: 60 mL/min/{1.73_m2} (ref >=60)
Globulin: 2.2 g/dL (calc) (ref 1.9–3.7)
Glucose, Bld: 100 mg/dL — ABNORMAL HIGH (ref 65–99)
Potassium: 4.1 mmol/L (ref 3.5–5.3)
Sodium: 141 mmol/L (ref 135–146)
Total Bilirubin: 0.4 mg/dL (ref 0.2–1.2)
Total Protein: 6.3 g/dL (ref 6.1–8.1)

## 2017-12-26 LAB — CBC WITH DIFFERENTIAL/PLATELET
Basophils Absolute: 50 cells/uL (ref 0–200)
Basophils Relative: 0.8 %
Eosinophils Absolute: 329 cells/uL (ref 15–500)
Eosinophils Relative: 5.3 %
HCT: 36.6 % (ref 35.0–45.0)
Hemoglobin: 12 g/dL (ref 11.7–15.5)
Lymphs Abs: 1314 cells/uL (ref 850–3900)
MCH: 27 pg (ref 27.0–33.0)
MCHC: 32.8 g/dL (ref 32.0–36.0)
MCV: 82.2 fL (ref 80.0–100.0)
MPV: 11.4 fL (ref 7.5–12.5)
Monocytes Relative: 11.2 %
Neutro Abs: 3813 cells/uL (ref 1500–7800)
Neutrophils Relative %: 61.5 %
Platelets: 256 10*3/uL (ref 140–400)
RBC: 4.45 10*6/uL (ref 3.80–5.10)
RDW: 13 % (ref 11.0–15.0)
Total Lymphocyte: 21.2 %
WBC mixed population: 694 cells/uL (ref 200–950)
WBC: 6.2 10*3/uL (ref 3.8–10.8)

## 2017-12-26 LAB — SEDIMENTATION RATE: Sed Rate: 17 mm/h (ref 0–30)

## 2017-12-27 NOTE — Telephone Encounter (Signed)
Can the patient pay cash at [facility] $4.  If not use tizanidine 4 mg every 8 hours.  This is medication she has at home.  But she would need to take 4 mg rather than 2 mg.

## 2017-12-27 NOTE — Telephone Encounter (Signed)
Received PA determination.   PA denied as rebound HA is not an indicated Dx per medicare guidelines.   Please advise.

## 2017-12-28 ENCOUNTER — Telehealth: Payer: Self-pay

## 2017-12-28 MED ORDER — TIZANIDINE HCL 4 MG PO TABS: 4.0000 mg | ORAL_TABLET | Freq: Three times a day (TID) | ORAL | 1 refills | Status: DC | PRN

## 2017-12-28 NOTE — Telephone Encounter (Signed)
Call placed to patient and patient made aware.   States that she would prefer to increase Zanaflex. Prescription sent to pharmacy.

## 2017-12-28 NOTE — Telephone Encounter (Signed)
Received faxes results for patient FIT test, patient test was negative

## 2018-01-28 ENCOUNTER — Telehealth: Payer: Self-pay | Admitting: *Deleted

## 2018-01-28 MED ORDER — LOSARTAN POTASSIUM 100 MG PO TABS: 50.0000 mg | ORAL_TABLET | Freq: Every day | ORAL | 1 refills | Status: DC

## 2018-01-28 NOTE — Telephone Encounter (Signed)
Received fax requesting alternative to Losartan 50mg .   Prescription changed to Losartan 100mg  (1/2) tab PO QD.

## 2018-02-11 ENCOUNTER — Telehealth: Payer: Self-pay | Admitting: Family Medicine

## 2018-02-11 NOTE — Telephone Encounter (Signed)
Pt called and would like a note to get out of jury duty. She states that since her back surgery she can not sit for long periods of time. Can you write her a letter excusing her from [employer]?

## 2018-02-11 NOTE — Telephone Encounter (Signed)
I am sorry but she does not have a medical handicap that prevents her from serving jury duty.

## 2018-02-28 NOTE — Telephone Encounter (Signed)
02/18/18 pt aware of recommendations via vm

## 2018-03-01 ENCOUNTER — Other Ambulatory Visit: Payer: Self-pay | Admitting: Family Medicine

## 2018-03-01 MED ORDER — LOSARTAN POTASSIUM 100 MG PO TABS: 50.0000 mg | ORAL_TABLET | Freq: Every day | ORAL | 1 refills | Status: DC

## 2018-05-01 ENCOUNTER — Other Ambulatory Visit: Payer: Self-pay | Admitting: Family Medicine

## 2018-05-01 NOTE — Telephone Encounter (Signed)
Requesting refill    Tizanidine and Ambien to mail order  LOV: 12/25/17  LRF:  12/28/17 Tizanidine & 10/23/17 Ambien

## 2018-05-02 MED ORDER — ZOLPIDEM TARTRATE ER 12.5 MG PO TBCR: 12.5000 mg | EXTENDED_RELEASE_TABLET | Freq: Every day | ORAL | 1 refills | Status: DC

## 2018-05-02 MED ORDER — TIZANIDINE HCL 4 MG PO TABS: 4.0000 mg | ORAL_TABLET | Freq: Three times a day (TID) | ORAL | 1 refills | Status: DC | PRN

## 2018-05-02 NOTE — Addendum Note (Signed)
Addended by: WILLIS, SANDY B on: 05/02/2018 10:43 AM   Modules accepted: Orders  

## 2018-05-02 NOTE — Telephone Encounter (Signed)
Re-send

## 2018-05-27 ENCOUNTER — Other Ambulatory Visit: Payer: Self-pay | Admitting: Family Medicine

## 2018-05-27 MED ORDER — VITAMIN D3 10 MCG (400 UNIT) PO CAPS: 2.0000 | ORAL_CAPSULE | Freq: Every day | ORAL | 2 refills | Status: DC

## 2018-05-29 ENCOUNTER — Other Ambulatory Visit: Payer: Self-pay | Admitting: *Deleted

## 2018-05-29 MED ORDER — VITAMIN D3 10 MCG (400 UNIT) PO CAPS: 2.0000 | ORAL_CAPSULE | Freq: Every day | ORAL | 2 refills | Status: DC

## 2018-08-12 ENCOUNTER — Telehealth: Payer: Self-pay | Admitting: Family Medicine

## 2018-08-12 ENCOUNTER — Other Ambulatory Visit: Payer: Self-pay | Admitting: Family Medicine

## 2018-08-12 MED ORDER — PREDNISONE 20 MG PO TABS: ORAL_TABLET | ORAL | 0 refills | Status: DC

## 2018-08-12 NOTE — Telephone Encounter (Signed)
I sent prednisone to [facility] on [address]

## 2018-08-12 NOTE — Telephone Encounter (Signed)
PATIENT CALLING TO SAY THAT SHE HAS POISON IVY LIKE SHE GETS EVERY YEAR AND WOULD LIKE TO KNOW IF SHE CAN GET PREDNISONE CALLED IN  [phone]

## 2018-08-13 NOTE — Telephone Encounter (Signed)
Pt aware via vm 

## 2018-08-26 ENCOUNTER — Other Ambulatory Visit: Payer: Self-pay | Admitting: Family Medicine

## 2018-08-26 DIAGNOSIS — Z1231 Encounter for screening mammogram for malignant neoplasm of breast: Secondary | ICD-10-CM

## 2018-09-20 ENCOUNTER — Telehealth: Payer: Self-pay | Admitting: Family Medicine

## 2018-09-20 NOTE — Telephone Encounter (Signed)
Patient aware of results and of providers recommendations via vm 

## 2018-09-20 NOTE — Telephone Encounter (Signed)
She needs to be checking her blood pressure.  If greater than 140/90 we need to resume something for blood pressure.  If less than 140/90 I am okay with her off the blood pressure medication

## 2018-09-20 NOTE — Telephone Encounter (Signed)
Patient called Audrey David stating that she had been having some dizziness and she stopped her BP med and her dizziness has subsided. Then she had a friend tell her that she should not abruptly stop a BP medication. It was losartan.

## 2018-10-10 ENCOUNTER — Ambulatory Visit: Payer: Medicare Other

## 2018-10-11 ENCOUNTER — Ambulatory Visit
Admission: RE | Admit: 2018-10-11 | Discharge: 2018-10-11 | Disposition: A | Discharge status: Home / Self Care | Payer: Medicare Other | Source: Ambulatory Visit | Attending: Family Medicine | Admitting: Family Medicine

## 2018-10-11 ENCOUNTER — Other Ambulatory Visit: Payer: Self-pay

## 2018-10-11 DIAGNOSIS — Z1231 Encounter for screening mammogram for malignant neoplasm of breast: Secondary | ICD-10-CM

## 2018-10-18 ENCOUNTER — Other Ambulatory Visit: Payer: Self-pay | Admitting: Family Medicine

## 2018-10-18 MED ORDER — ZOLPIDEM TARTRATE ER 12.5 MG PO TBCR: 12.5000 mg | EXTENDED_RELEASE_TABLET | Freq: Every day | ORAL | 1 refills | Status: DC

## 2018-10-18 MED ORDER — TIZANIDINE HCL 4 MG PO TABS: 4.0000 mg | ORAL_TABLET | Freq: Three times a day (TID) | ORAL | 1 refills | Status: DC | PRN

## 2018-10-18 NOTE — Telephone Encounter (Signed)
Requesting refill    Ambien CR & Tizanidine  LOV: 12/25/17  LRF:  05/02/18

## 2018-10-23 ENCOUNTER — Emergency Department (HOSPITAL_COMMUNITY)
Admission: EM | Admit: 2018-10-23 | Discharge: 2018-10-23 | Disposition: A | Discharge status: Home / Self Care | Payer: Medicare Other | Attending: Emergency Medicine | Admitting: Emergency Medicine

## 2018-10-23 ENCOUNTER — Other Ambulatory Visit: Payer: Self-pay

## 2018-10-23 ENCOUNTER — Encounter (HOSPITAL_COMMUNITY): Payer: Self-pay | Admitting: Family Medicine

## 2018-10-23 DIAGNOSIS — I10 Essential (primary) hypertension: Secondary | ICD-10-CM | POA: Insufficient documentation

## 2018-10-23 DIAGNOSIS — Z79899 Other long term (current) drug therapy: Secondary | ICD-10-CM | POA: Diagnosis not present

## 2018-10-23 DIAGNOSIS — M62838 Other muscle spasm: Secondary | ICD-10-CM

## 2018-10-23 DIAGNOSIS — M542 Cervicalgia: Secondary | ICD-10-CM | POA: Diagnosis present

## 2018-10-23 MED ORDER — NAPROXEN 500 MG PO TABS: 500.0000 mg | ORAL_TABLET | Freq: Two times a day (BID) | ORAL | 0 refills | Status: DC | PRN

## 2018-10-23 MED ORDER — DIAZEPAM 2 MG PO TABS
2.0000 mg | ORAL_TABLET | Freq: Once | ORAL | Status: AC
  Administered 2018-10-23: 2 mg via ORAL
  Filled 2018-10-23: qty 1

## 2018-10-23 MED ORDER — CYCLOBENZAPRINE HCL 10 MG PO TABS: 10.0000 mg | ORAL_TABLET | Freq: Two times a day (BID) | ORAL | 0 refills | Status: DC | PRN

## 2018-10-23 MED ORDER — KETOROLAC TROMETHAMINE 60 MG/2ML IM SOLN
30.0000 mg | Freq: Once | INTRAMUSCULAR | Status: AC
  Administered 2018-10-23: 09:00:00 30 mg via INTRAMUSCULAR
  Filled 2018-10-23: qty 2

## 2018-10-23 MED ORDER — ACETAMINOPHEN 325 MG PO TABS
650.0000 mg | ORAL_TABLET | Freq: Once | ORAL | Status: AC
  Administered 2018-10-23: 650 mg via ORAL
  Filled 2018-10-23: qty 2

## 2018-10-23 NOTE — ED Triage Notes (Signed)
Patient is from home and transported via [facility]. Patient is complaining of neck pain that gradually started on Sunday. Patient denies the pain changes when she applies her chin to her chest. Also, complains of upper back and right shoulder pain.

## 2018-10-23 NOTE — Discharge Instructions (Addendum)
I recommend taking the medication as prescribed.  Additionally recommend taking Tylenol for pain control.  If at any point you develop numbness, tingling, neck swelling, fever, worsening and return of the severe pain, please return to the ER for reassessment.  Otherwise recommend following up with your primary doctor for recheck.

## 2018-10-23 NOTE — ED Provider Notes (Addendum)
[facility] DEPT Provider Note   CSN: [id number] Arrival date & time: 10/23/18  0359     History   Chief Complaint Chief Complaint  Patient presents with  . Neck Pain    HPI Audrey David is a 73 y.o. female.  Presents emerge department chief complaint neck pain.  Patient states pain has been progressively getting worse since Sunday, worse with position changes, radiates from right side of her neck down to her right shoulder.  States she has not had any associated trauma, has not suffered from this pain before.  Reports that last weekend she may have slept abnormally in her neck.  She denies any swelling, deformity, numbness, weakness, vision changes.     HPI  Past Medical History:  Diagnosis Date  . Arthritis   . Detached retina   . Hemorrhoids   . Hypertension   . Insomnia   . Phlebitis     Patient Active Problem List   Diagnosis Date Noted  . Osteopenia 03/01/2016  . Back pain 12/02/2014  . 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 Arthritis   . Hypertension   . Insomnia   . Internal hemorrhoids without mention of complication 62/95/2841  . Special screening for malignant neoplasms, colon 08/08/2011    Past Surgical History:  Procedure Laterality Date  . ABDOMINAL HYSTERECTOMY    . COLONOSCOPY    . TUBAL LIGATION       OB History   No obstetric history on file.      Home Medications    Prior to Admission medications   Medication Sig Start Date End Date Taking? Authorizing Provider  APPLE CIDER VINEGAR PO Take 1 capsule by mouth daily.   Yes [provider]  Ascorbic Acid (VITAMIN C PO) Take 1 tablet by mouth daily.   Yes [provider]  Cholecalciferol (VITAMIN D3) 10 MCG (400 UNIT) CAPS Take 2 capsules by mouth daily. 05/29/18  Yes Susy Frizzle, MD  losartan (COZAAR) 100 MG tablet Take 0.5 tablets (50 mg total) by mouth daily. Patient taking differently: Take 100 mg by mouth daily as needed (if Sys above  140).  03/01/18  Yes Susy Frizzle, MD  tiZANidine (ZANAFLEX) 4 MG tablet Take 1 tablet (4 mg total) by mouth every 8 (eight) hours as needed. Patient taking differently: Take 4 mg by mouth 2 (two) times daily.  10/18/18  Yes Susy Frizzle, MD  traMADol (ULTRAM) 50 MG tablet Take 50 mg by mouth once.   Yes [provider]  Turmeric 500 MG TABS Take 500 mg by mouth 2 (two) times daily.   Yes [provider]  VITAMIN E PO Take 1 tablet by mouth daily.   Yes [provider]  zolpidem (AMBIEN CR) 12.5 MG CR tablet Take 1 tablet (12.5 mg total) by mouth at bedtime. 10/18/18  Yes Susy Frizzle, MD  alendronate (FOSAMAX) 70 MG tablet TAKE 1 TABLET EVERY 7 DAYS WITH FULL GLASS OF WATER ON AN EMPTY STOMACH. SIT UPRIGHT FOR 30 MINUTES Patient not taking: Reported on 10/23/2018 01/25/17   Dena Billet B, PA-C  Calcium Carbonate-Vit D-Min (CALCIUM 1200) 1200-1000 MG-UNIT CHEW Chew 1,200 mg by mouth daily. Patient not taking: Reported on 10/23/2018 03/16/16   Dena Billet B, PA-C  cyclobenzaprine (FLEXERIL) 10 MG tablet Take 1 tablet (10 mg total) by mouth 2 (two) times daily as needed for muscle spasms. 10/23/18   Lucrezia Starch, MD  naproxen (NAPROSYN) 500 MG tablet Take  1 tablet (500 mg total) by mouth 2 (two) times daily as needed. 10/23/18   Lucrezia Starch, MD  predniSONE (DELTASONE) 20 MG tablet 3 tabs poqday 1-2, 2 tabs poqday 3-4, 1 tab poqday 5-6 Patient not taking: Reported on 10/23/2018 08/12/18   Susy Frizzle, MD    Family History Family History  Problem Relation Age of Onset  . Hypertension Mother   . Cancer Father 62       multiple myeloma. Throat Cancer.  . Breast cancer Neg Hx     Social History Social History   Tobacco Use  . Smoking status: Former Research scientist (life sciences)  . Smokeless tobacco: Never Used  . Tobacco comment:  " quit smoking cigarettes in 2007 "  Substance Use Topics  . Alcohol use: Yes    Comment: Social   . Drug use: No     Allergies    Ace inhibitors and Codeine   Review of Systems Review of Systems  Constitutional: Negative for chills and fever.  HENT: Negative for ear pain and sore throat.   Eyes: Negative for pain and visual disturbance.  Respiratory: Negative for cough and shortness of breath.   Cardiovascular: Negative for chest pain and palpitations.  Gastrointestinal: Negative for abdominal pain and vomiting.  Genitourinary: Negative for dysuria and hematuria.  Musculoskeletal: Positive for myalgias and neck pain. Negative for arthralgias and back pain.  Skin: Negative for color change and rash.  Neurological: Negative for seizures and syncope.  All other systems reviewed and are negative.    Physical Exam Updated Vital Signs BP (!) 156/80   Pulse 75   Resp 15   Ht 5' 5.5" (1.664 m)   Wt 69.4 kg   SpO2 99%   BMI 25.07 kg/m   Physical Exam Vitals signs and nursing note reviewed.  Constitutional:      General: She is not in acute distress.    Appearance: She is well-developed.  HENT:     Head: Normocephalic and atraumatic.  Eyes:     Conjunctiva/sclera: Conjunctivae normal.  Neck:     Musculoskeletal: Neck supple.     Comments: Tenderness palpation over right lateral neck, no midline C-spine tenderness, no neck swelling Cardiovascular:     Rate and Rhythm: Normal rate and regular rhythm.     Heart sounds: No murmur.  Pulmonary:     Effort: Pulmonary effort is normal. No respiratory distress.     Breath sounds: Normal breath sounds.  Abdominal:     Palpations: Abdomen is soft.     Tenderness: There is no abdominal tenderness.  Musculoskeletal:     Comments: Tenderness palpation of the right lateral neck, right upper back, no midline C, T, L-spine tenderness  Skin:    General: Skin is warm and dry.     Capillary Refill: Capillary refill takes less than 2 seconds.  Neurological:     Mental Status: She is alert.     Comments: Cranial nerves II through XII intact, visual fields intact,  speech clear, strength in bilateral upper and lower extremities intact, sensation to light touch intact throughout upper and lower extremities intact  Psychiatric:        Mood and Affect: Mood normal.        Behavior: Behavior normal.      ED Treatments / Results  Labs (all labs ordered are listed, but only abnormal results are displayed) Labs Reviewed - No data to display  EKG None  Radiology No results found.  Procedures Procedures (  including critical care time)  Medications Ordered in ED Medications  diazepam (VALIUM) tablet 2 mg (2 mg Oral Given 10/23/18 0901)  ketorolac (TORADOL) injection 30 mg (30 mg Intramuscular Given 10/23/18 0901)  acetaminophen (TYLENOL) tablet 650 mg (650 mg Oral Given 10/23/18 0901)     Initial Impression / Assessment and Plan / ED Course  I have reviewed the triage vital signs and the nursing notes.  Pertinent labs & imaging results that were available during my care of the patient were reviewed by me and considered in my medical decision making (see chart for details).  Clinical Course as of Oct 22 1548  Wed Oct 23, 2018  1039 Recheck patient, near complete resolution of symptoms, able to have complete normal neck range of motion, no neck swelling, no neck stiffness, repeat neurologic exam is unremarkable, no midline C-spine tenderness again on exam, will discharge home with symptomatic treatment   [RD]    Clinical Course User Index [RD] Lucrezia Starch, MD       73 year old lady presented to the emergency department with severe right lateral neck pain.  On exam had some tenderness palpation over her neck muscles, no midline tenderness, no deformity or swelling noted.  Strong suspicion for muscle spasm.  After dose of NSAIDs, Valium, patient had near complete resolution of symptoms and was very well-appearing, had completely normal neck range of motion.  Throughout this time did not have any associated neurologic symptoms and no midline  tenderness, given this reassuring physical exam and history, I do not feel emergent imaging needed at this time.  However I discussed with patient should symptoms change and she develop any of these new symptoms, she needs to return to the emergency department for reassessment.  Will give prescription for muscle relaxant, short course of NSAIDs and recommend recheck with PCP.  After the discussed management above, the patient was determined to be safe for discharge.  The patient was in agreement with this plan and all questions regarding their care were answered.  ED return precautions were discussed and the patient will return to the ED with any significant worsening of condition.    Final Clinical Impressions(s) / ED Diagnoses   Final diagnoses:  Muscle spasms of neck    ED Discharge Orders         Ordered    cyclobenzaprine (FLEXERIL) 10 MG tablet  2 times daily PRN     10/23/18 1041    naproxen (NAPROSYN) 500 MG tablet  2 times daily PRN     10/23/18 1041           Lucrezia Starch, MD 10/23/18 1553    Lucrezia Starch, MD 10/23/18 1556

## 2018-10-31 ENCOUNTER — Ambulatory Visit
Admission: RE | Admit: 2018-10-31 | Discharge: 2018-10-31 | Disposition: A | Discharge status: Home / Self Care | Payer: Medicare Other | Source: Ambulatory Visit | Attending: Family Medicine | Admitting: Family Medicine

## 2018-10-31 ENCOUNTER — Encounter: Payer: Self-pay | Admitting: Family Medicine

## 2018-10-31 ENCOUNTER — Other Ambulatory Visit: Payer: Self-pay

## 2018-10-31 ENCOUNTER — Ambulatory Visit (INDEPENDENT_AMBULATORY_CARE_PROVIDER_SITE_OTHER): Payer: Medicare Other | Admitting: Family Medicine

## 2018-10-31 VITALS — BP 130/80 | HR 76 | Temp 98.5°F | Resp 18 | Ht 64.0 in | Wt 148.0 lb

## 2018-10-31 DIAGNOSIS — M5412 Radiculopathy, cervical region: Secondary | ICD-10-CM | POA: Diagnosis not present

## 2018-10-31 DIAGNOSIS — I10 Essential (primary) hypertension: Secondary | ICD-10-CM

## 2018-10-31 MED ORDER — PREDNISONE 20 MG PO TABS: ORAL_TABLET | ORAL | 0 refills | Status: DC

## 2018-10-31 MED ORDER — OXYCODONE-ACETAMINOPHEN 7.5-325 MG PO TABS: 1.0000 | ORAL_TABLET | ORAL | 0 refills | Status: DC | PRN

## 2018-10-31 NOTE — Progress Notes (Signed)
Subjective:    Patient ID: Audrey David, female    DOB: 1945-07-13, 73 y.o.   MRN: [id number]  HPI  Patient presents today complaining of severe right-sided neck pain.  The pain radiates into her right trapezius down her right shoulder and into her right forearm.  The pain is moderate to severe in intensity.  It is made worse by turning her head to the right or to the left.  She denies any injury.  She denies any specific inciting event.  She denies any numbness or tingling in the left arm.  She denies any numbness or tingling in her legs or weakness in her legs.  She denies any loss of strength in her right arm.  She has severe tenderness to palpation in the right paraspinal muscles and right trapezius.  Was seen in the emergency room was given Valium and Flexeril and Naprosyn.  However the pain is getting worse.  Her blood pressure has been fluctuating wildly.  Most times it is low with systolic blood pressure between 80 and 751 and diastolic blood pressures in the 70s to 80s.  Rarely it will be elevated in the 1 02-5 50 range systolic.  However in the last month she is only had 4 days where the blood pressure was high.  If anything is usually low making her feel lightheaded and almost like she could pass out particularly with standing. Past Medical History:  Diagnosis Date  . Arthritis   . Detached retina   . Hemorrhoids   . Hypertension   . Insomnia   . Phlebitis    Past Surgical History:  Procedure Laterality Date  . ABDOMINAL HYSTERECTOMY    . COLONOSCOPY    . TUBAL LIGATION     Current Outpatient Medications on File Prior to Visit  Medication Sig Dispense Refill  . APPLE CIDER VINEGAR PO Take 1 capsule by mouth daily.    . Ascorbic Acid (VITAMIN C PO) Take 1 tablet by mouth daily.    . Cholecalciferol (VITAMIN D3) 10 MCG (400 UNIT) CAPS Take 2 capsules by mouth daily. 180 capsule 2  . cyclobenzaprine (FLEXERIL) 10 MG tablet Take 1 tablet (10 mg total) by mouth 2 (two) times  daily as needed for muscle spasms. 20 tablet 0  . losartan (COZAAR) 100 MG tablet Take 0.5 tablets (50 mg total) by mouth daily. (Patient taking differently: Take 100 mg by mouth daily. ) 45 tablet 1  . naproxen (NAPROSYN) 500 MG tablet Take 1 tablet (500 mg total) by mouth 2 (two) times daily as needed. 30 tablet 0  . tiZANidine (ZANAFLEX) 4 MG tablet Take 1 tablet (4 mg total) by mouth every 8 (eight) hours as needed. (Patient taking differently: Take 4 mg by mouth 2 (two) times daily. ) 180 tablet 1  . Turmeric 500 MG TABS Take 500 mg by mouth 2 (two) times daily.    Marland Kitchen VITAMIN E PO Take 1 tablet by mouth daily.    Marland Kitchen zolpidem (AMBIEN CR) 12.5 MG CR tablet Take 1 tablet (12.5 mg total) by mouth at bedtime. 90 tablet 1   No current facility-administered medications on file prior to visit.    Allergies  Allergen Reactions  . Ace Inhibitors Cough  . Codeine Nausea Only    60 years ago   Social History   Socioeconomic History  . Marital status: Single    Spouse name: Not on file  . Number of children: Not on file  . Years of education:  Not on file  . Highest education level: Not on file  Occupational History  . Not on file  Social Needs  . Financial resource strain: Not on file  . Food insecurity    Worry: Not on file    Inability: Not on file  . Transportation needs    Medical: Not on file    Non-medical: Not on file  Tobacco Use  . Smoking status: Former Games developermoker  . Smokeless tobacco: Never Used  . Tobacco comment:  " quit smoking cigarettes in 2007 "  Substance and Sexual Activity  . Alcohol use: Yes    Comment: Social   . Drug use: No  . Sexual activity: Not on file  Lifestyle  . Physical activity    Days per week: Not on file    Minutes per session: Not on file  . Stress: Not on file  Relationships  . Social Musicianconnections    Talks on phone: Not on file    Gets together: Not on file    Attends religious service: Not on file    Active member of club or organization:  Not on file    Attends meetings of clubs or organizations: Not on file    Relationship status: Not on file  . Intimate partner violence    Fear of current or ex partner: Not on file    Emotionally abused: Not on file    Physically abused: Not on file    Forced sexual activity: Not on file  Other Topics Concern  . Not on file  Social History Narrative   Daily caffeine      Review of Systems  All other systems reviewed and are negative.      Objective:   Physical Exam Vitals signs reviewed.  Constitutional:      General: She is not in acute distress.    Appearance: She is well-developed. She is not ill-appearing or toxic-appearing.  HENT:     Head: Normocephalic and atraumatic.  Eyes:     Pupils: Pupils are equal, round, and reactive to light.  Cardiovascular:     Rate and Rhythm: Normal rate and regular rhythm.     Heart sounds: Normal heart sounds. No murmur.  Pulmonary:     Effort: Pulmonary effort is normal. No respiratory distress.     Breath sounds: Normal breath sounds. No wheezing.  Abdominal:     General: Bowel sounds are normal.     Palpations: Abdomen is soft.  Musculoskeletal:     Cervical back: She exhibits decreased range of motion, tenderness and pain.  Neurological:     Mental Status: She is alert and oriented to person, place, and time. She is not disoriented.     Cranial Nerves: No cranial nerve deficit.     Sensory: No sensory deficit.     Coordination: Coordination normal.     Gait: Gait normal.         Assessment & Plan:  Cervical radiculopathy - Plan: DG Cervical Spine Complete, predniSONE (DELTASONE) 20 MG tablet  Essential hypertension Discontinue losartan and check blood pressure every day.  Goal blood pressure is between 110 and 140/50-90.  If blood pressure is in that range without medication I would not add any medication back.  If the blood pressure is higher than that we may need to resume losartan albeit at a lower dose such as 25  mg a day.  I suspect that she has herniated a disc in her cervical spine.  Discontinue  Naprosyn and replace with a prednisone taper pack.  Continue tizanidine for muscle spasms.  Discontinue Flexeril given in the emergency room to avoid polypharmacy.  She can use Percocet as needed for breakthrough pain.  Proceed with an x-ray of the cervical spine and reassess in 1 week.

## 2018-11-15 ENCOUNTER — Other Ambulatory Visit: Payer: Self-pay

## 2018-11-15 DIAGNOSIS — M5412 Radiculopathy, cervical region: Secondary | ICD-10-CM

## 2018-11-15 MED ORDER — PREDNISONE 20 MG PO TABS: ORAL_TABLET | ORAL | 0 refills | Status: DC

## 2018-11-15 NOTE — Telephone Encounter (Signed)
Per Dr. Dennard Schaumann ok to send in - med sent to pharm.

## 2018-11-15 NOTE — Telephone Encounter (Signed)
Requested Prescriptions   Pending Prescriptions Disp Refills  . predniSONE (DELTASONE) 20 MG tablet 12 tablet 0    Sig: 3 tabs poqday 1-2, 2 tabs poqday 3-4, 1 tab poqday 5-6    Pt called to report that she was working in the yard and she is broke out with hives all over and would like another round of prednisone.Please advise.

## 2018-11-18 ENCOUNTER — Other Ambulatory Visit: Payer: Self-pay | Admitting: Family Medicine

## 2018-11-19 ENCOUNTER — Other Ambulatory Visit: Payer: Self-pay

## 2018-11-19 MED ORDER — LOSARTAN POTASSIUM 100 MG PO TABS: 50.0000 mg | ORAL_TABLET | Freq: Every day | ORAL | 1 refills | Status: DC

## 2018-12-06 ENCOUNTER — Other Ambulatory Visit: Payer: Self-pay | Admitting: Family Medicine

## 2018-12-06 DIAGNOSIS — M5412 Radiculopathy, cervical region: Secondary | ICD-10-CM

## 2018-12-23 ENCOUNTER — Ambulatory Visit
Admission: RE | Admit: 2018-12-23 | Discharge: 2018-12-23 | Disposition: A | Discharge status: Home / Self Care | Payer: Medicare Other | Source: Ambulatory Visit | Attending: Family Medicine | Admitting: Family Medicine

## 2018-12-23 ENCOUNTER — Other Ambulatory Visit: Payer: Self-pay

## 2018-12-23 DIAGNOSIS — M5412 Radiculopathy, cervical region: Secondary | ICD-10-CM

## 2018-12-24 ENCOUNTER — Encounter: Payer: Self-pay | Admitting: Family Medicine

## 2018-12-24 ENCOUNTER — Other Ambulatory Visit: Payer: Self-pay | Admitting: Family Medicine

## 2018-12-24 DIAGNOSIS — M5030 Other cervical disc degeneration, unspecified cervical region: Secondary | ICD-10-CM

## 2018-12-24 DIAGNOSIS — M5412 Radiculopathy, cervical region: Secondary | ICD-10-CM

## 2019-01-24 ENCOUNTER — Other Ambulatory Visit: Payer: Medicare Other

## 2019-01-30 ENCOUNTER — Ambulatory Visit (INDEPENDENT_AMBULATORY_CARE_PROVIDER_SITE_OTHER): Payer: Medicare Other | Admitting: Family Medicine

## 2019-01-30 ENCOUNTER — Other Ambulatory Visit: Payer: Self-pay

## 2019-01-30 ENCOUNTER — Encounter: Payer: Self-pay | Admitting: Family Medicine

## 2019-01-30 VITALS — BP 130/78 | HR 78 | Temp 97.5°F | Resp 18 | Ht 64.0 in | Wt 151.0 lb

## 2019-01-30 DIAGNOSIS — E041 Nontoxic single thyroid nodule: Secondary | ICD-10-CM

## 2019-01-30 DIAGNOSIS — F321 Major depressive disorder, single episode, moderate: Secondary | ICD-10-CM

## 2019-01-30 MED ORDER — ESCITALOPRAM OXALATE 10 MG PO TABS: 10.0000 mg | ORAL_TABLET | Freq: Every day | ORAL | 3 refills | Status: DC

## 2019-01-30 MED ORDER — ALPRAZOLAM 0.5 MG PO TABS: 0.5000 mg | ORAL_TABLET | Freq: Three times a day (TID) | ORAL | 0 refills | Status: AC | PRN

## 2019-01-30 NOTE — Progress Notes (Signed)
Subjective:    Patient ID: Audrey David, female    DOB: February 26, 1945, 73 y.o.   MRN: [id number]  HPI  Patient is battling depression.  Recently sister was diagnosed with metastatic lung cancer with metastasis to the brain.  She is scheduled to go up [place] and help care for her sister.  Uncertain of how long this will take however the prognosis appears bleak.  Family friend recently died from throat cancer.  Patient's father died from throat cancer as well.  All this has her extremely anxious regarding a nodule that was seen on the MRI of her neck.  It was a 14 mm heterogeneous appearing thyroid nodule that was thought to be benign.  However the patient is obviously concerned given the cancer that has been discovered in her family.  Her mother's dementia is drastically worsening.  The entire situation with COVID-19, the quarantine, feeling isolated all have the patient feeling depressed with anhedonia and anxiety and trouble sleeping despite taking Ambien every night. Past Medical History:  Diagnosis Date  . Arthritis   . 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 Detached retina   . Hemorrhoids   . Hypertension   . Insomnia   . Phlebitis    Past Surgical History:  Procedure Laterality Date  . ABDOMINAL HYSTERECTOMY    . COLONOSCOPY    . TUBAL LIGATION     Current Outpatient Medications on File Prior to Visit  Medication Sig Dispense Refill  . APPLE CIDER VINEGAR PO Take 1 capsule by mouth daily.    . Ascorbic Acid (VITAMIN C PO) Take 1 tablet by mouth daily.    . Cholecalciferol (VITAMIN D3) 10 MCG (400 UNIT) CAPS Take 2 capsules by mouth daily. 180 capsule 2  . cyclobenzaprine (FLEXERIL) 10 MG tablet Take 1 tablet (10 mg total) by mouth 2 (two) times daily as needed for muscle spasms. 20 tablet 0  . losartan (COZAAR) 100 MG tablet Take 0.5 tablets (50 mg total) by mouth daily. 45 tablet 1  . naproxen (NAPROSYN) 500 MG tablet Take  1 tablet (500 mg total) by mouth 2 (two) times daily as needed. 30 tablet 0  . oxyCODONE-acetaminophen (PERCOCET) 7.5-325 MG tablet Take 1 tablet by mouth every 4 (four) hours as needed for severe pain. 30 tablet 0  . tiZANidine (ZANAFLEX) 4 MG tablet Take 1 tablet (4 mg total) by mouth every 8 (eight) hours as needed. (Patient taking differently: Take 4 mg by mouth 2 (two) times daily. ) 180 tablet 1  . Turmeric 500 MG TABS Take 500 mg by mouth 2 (two) times daily.    Marland Kitchen VITAMIN E PO Take 1 tablet by mouth daily.    Marland Kitchen zolpidem (AMBIEN CR) 12.5 MG CR tablet Take 1 tablet (12.5 mg total) by mouth at bedtime. 90 tablet 1   No current facility-administered medications on file prior to visit.   Allergies  Allergen Reactions  . Ace Inhibitors Cough  . Codeine Nausea Only    60 years ago   Social History   Socioeconomic History  . Marital status: Single    Spouse name: Not on file  . Number of children: Not on file  . Years of education: Not on file  . Highest education level: Not on file  Occupational History  . Not on file  Tobacco Use  . Smoking status: Former Research scientist (life sciences)  . Smokeless tobacco: Never Used  . Tobacco comment:  "  quit smoking cigarettes in 2007 "  Substance and Sexual Activity  . Alcohol use: Yes    Comment: Social   . Drug use: No  . Sexual activity: Not on file  Other Topics Concern  . Not on file  Social History Narrative   Daily caffeine    Social Determinants of Health   Financial Resource Strain:   . Difficulty of Paying Living Expenses: Not on file  Food Insecurity:   . Worried About Programme researcher, broadcasting/film/video in the Last Year: Not on file  . Ran Out of Food in the Last Year: Not on file  Transportation Needs:   . Lack of Transportation (Medical): Not on file  . Lack of Transportation (Non-Medical): Not on file  Physical Activity:   . Days of Exercise per Week: Not on file  . Minutes of Exercise per Session: Not on file  Stress:   . Feeling of Stress : Not on  file  Social Connections:   . Frequency of Communication with Friends and Family: Not on file  . Frequency of Social Gatherings with Friends and Family: Not on file  . Attends Religious Services: Not on file  . Active Member of Clubs or Organizations: Not on file  . Attends Banker Meetings: Not on file  . Marital Status: Not on file  Intimate Partner Violence:   . Fear of Current or Ex-Partner: Not on file  . Emotionally Abused: Not on file  . Physically Abused: Not on file  . Sexually Abused: Not on file     Review of Systems  All other systems reviewed and are negative.      Objective:   Physical Exam Vitals reviewed.  Constitutional:      General: She is not in acute distress.    Appearance: She is well-developed. She is not ill-appearing or toxic-appearing.  HENT:     Head: Normocephalic and atraumatic.  Eyes:     Pupils: Pupils are equal, round, and reactive to light.  Cardiovascular:     Rate and Rhythm: Normal rate and regular rhythm.     Heart sounds: Normal heart sounds. No murmur.  Pulmonary:     Effort: Pulmonary effort is normal. No respiratory distress.     Breath sounds: Normal breath sounds. No wheezing.  Abdominal:     General: Bowel sounds are normal.     Palpations: Abdomen is soft.  Neurological:     Mental Status: She is alert and oriented to person, place, and time. She is not disoriented.     Cranial Nerves: No cranial nerve deficit.     Sensory: No sensory deficit.     Coordination: Coordination normal.     Gait: Gait normal.         Assessment & Plan:  Depression, major, single episode, moderate (HCC)  Begin Lexapro 10 mg a day and reassess in 4 weeks.  Use Xanax 0.5 mg every 8 hours as needed for anxiety.  Do not combine with Ambien.  Reassess in 4 weeks.  Schedule thyroid ultrasound mainly for patient peace of mind.

## 2019-02-11 ENCOUNTER — Encounter: Payer: Self-pay | Admitting: Family Medicine

## 2019-02-20 ENCOUNTER — Other Ambulatory Visit: Payer: Medicare Other

## 2019-02-21 ENCOUNTER — Other Ambulatory Visit: Payer: Self-pay | Admitting: Family Medicine

## 2019-04-07 ENCOUNTER — Other Ambulatory Visit: Payer: Self-pay | Admitting: Family Medicine

## 2019-04-07 NOTE — Telephone Encounter (Signed)
Ok to refill??  Last office visit 01/30/2019.  Last refill 10/18/2018 to mail order.

## 2019-04-23 ENCOUNTER — Other Ambulatory Visit: Payer: Self-pay

## 2019-04-23 NOTE — Telephone Encounter (Signed)
A 10 day supply of what?

## 2019-04-23 NOTE — Telephone Encounter (Signed)
Pt is out of town in [place] taking care of her sister and would like to know if Tanya Nones could write a Rx for a 10 day supply until she can get back home. Pharmacy is [facility] in [place], [place]. Please advise.

## 2019-04-24 MED ORDER — ZOLPIDEM TARTRATE ER 12.5 MG PO TBCR: 12.5000 mg | EXTENDED_RELEASE_TABLET | Freq: Every day | ORAL | 0 refills | Status: DC

## 2019-04-24 NOTE — Telephone Encounter (Signed)
Oh sorry. Her ambien.

## 2019-04-25 ENCOUNTER — Other Ambulatory Visit: Payer: Self-pay | Admitting: Family Medicine

## 2019-05-08 ENCOUNTER — Ambulatory Visit: Payer: Medicare Other | Attending: Internal Medicine

## 2019-05-08 DIAGNOSIS — Z23 Encounter for immunization: Secondary | ICD-10-CM

## 2019-05-08 NOTE — Progress Notes (Signed)
   Covid-19 Vaccination Clinic  Name:  Audrey David    MRN: [id number] DOB: 1946/02/11  05/08/2019  Audrey David was observed post Covid-19 immunization for 15 minutes without incident. She was provided with Vaccine Information Sheet and instruction to access the V-Safe system.   Audrey David was instructed to call 911 with any severe reactions post vaccine: Marland Kitchen Difficulty breathing  . Swelling of face and throat  . A fast heartbeat  . A bad rash all over body  . Dizziness and weakness   Immunizations Administered    Name Date Dose VIS Date Route   Pfizer COVID-19 Vaccine 05/08/2019  1:23 PM 0.3 mL 01/31/2019 Intramuscular   Manufacturer: [employer], [employer]   Lot: [id number]   NDC: [address]-6

## 2019-05-17 ENCOUNTER — Other Ambulatory Visit: Payer: Self-pay | Admitting: Family Medicine

## 2019-05-19 ENCOUNTER — Other Ambulatory Visit: Payer: Self-pay | Admitting: *Deleted

## 2019-05-19 MED ORDER — ZOLPIDEM TARTRATE ER 12.5 MG PO TBCR: 12.5000 mg | EXTENDED_RELEASE_TABLET | Freq: Every day | ORAL | 0 refills | Status: DC

## 2019-05-19 NOTE — Telephone Encounter (Signed)
Received fax requesting refill on Ambien CR.   Ok to refill??  Last office visit 01/30/2019.  Last refill 04/24/2019, #10 tabs.

## 2019-05-23 ENCOUNTER — Ambulatory Visit
Admission: RE | Admit: 2019-05-23 | Discharge: 2019-05-23 | Disposition: A | Discharge status: Home / Self Care | Payer: Medicare Other | Source: Ambulatory Visit | Attending: Family Medicine | Admitting: Family Medicine

## 2019-05-23 DIAGNOSIS — E041 Nontoxic single thyroid nodule: Secondary | ICD-10-CM

## 2019-05-26 ENCOUNTER — Encounter: Payer: Self-pay | Admitting: Family Medicine

## 2019-05-26 DIAGNOSIS — E042 Nontoxic multinodular goiter: Secondary | ICD-10-CM | POA: Insufficient documentation

## 2019-06-02 ENCOUNTER — Ambulatory Visit: Payer: Medicare Other | Attending: Internal Medicine

## 2019-06-02 DIAGNOSIS — Z23 Encounter for immunization: Secondary | ICD-10-CM

## 2019-06-02 NOTE — Progress Notes (Signed)
   Covid-19 Vaccination Clinic  Name:  Audrey David    MRN: [id number] DOB: 12-09-1945  06/02/2019  [name] was observed post Covid-19 immunization for 15 minutes without incident. She was provided with Vaccine Information Sheet and instruction to access the V-Safe system.   [name] was instructed to call 911 with any severe reactions post vaccine: Marland Kitchen Difficulty breathing  . Swelling of face and throat  . A fast heartbeat  . A bad rash all over body  . Dizziness and weakness   Immunizations Administered    Name Date Dose VIS Date Route   Pfizer COVID-19 Vaccine 06/02/2019  1:40 PM 0.3 mL 01/31/2019 Intramuscular   Manufacturer: [employer], [employer]   Lot: [id number]   NDC: [address]-8

## 2019-07-27 ENCOUNTER — Other Ambulatory Visit: Payer: Self-pay | Admitting: Family Medicine

## 2019-07-28 NOTE — Telephone Encounter (Signed)
Requested Prescriptions   Pending Prescriptions Disp Refills   tiZANidine (ZANAFLEX) 4 MG tablet [Pharmacy Med Name: TIZANIDINE HCL TABS 4MG ] 180 tablet 0    Sig: Take 1 tablet (4 mg total) by mouth 2 (two) times daily.     Last OV 01/30/2019   Last written 10/18/2018

## 2019-08-19 ENCOUNTER — Other Ambulatory Visit: Payer: Self-pay

## 2019-08-19 MED ORDER — ESCITALOPRAM OXALATE 10 MG PO TABS: 10.0000 mg | ORAL_TABLET | Freq: Every day | ORAL | 0 refills | Status: DC

## 2019-09-03 ENCOUNTER — Other Ambulatory Visit: Payer: Self-pay | Admitting: Family Medicine

## 2019-09-03 DIAGNOSIS — Z1231 Encounter for screening mammogram for malignant neoplasm of breast: Secondary | ICD-10-CM

## 2019-09-15 ENCOUNTER — Other Ambulatory Visit: Payer: Self-pay

## 2019-09-15 ENCOUNTER — Ambulatory Visit (INDEPENDENT_AMBULATORY_CARE_PROVIDER_SITE_OTHER): Payer: Medicare Other | Admitting: Family Medicine

## 2019-09-15 VITALS — BP 110/62 | HR 63 | Temp 97.3°F

## 2019-09-15 DIAGNOSIS — F321 Major depressive disorder, single episode, moderate: Secondary | ICD-10-CM

## 2019-09-15 LAB — CBC WITH DIFFERENTIAL/PLATELET
Absolute Monocytes: 674 cells/uL (ref 200–950)
Basophils Absolute: 32 cells/uL (ref 0–200)
Basophils Relative: 0.5 %
Eosinophils Absolute: 252 cells/uL (ref 15–500)
Eosinophils Relative: 4 %
HCT: 35 % (ref 35.0–45.0)
Hemoglobin: 11.3 g/dL — ABNORMAL LOW (ref 11.7–15.5)
Lymphs Abs: 1575 cells/uL (ref 850–3900)
MCH: 28.1 pg (ref 27.0–33.0)
MCHC: 32.3 g/dL (ref 32.0–36.0)
MCV: 87.1 fL (ref 80.0–100.0)
MPV: 11.4 fL (ref 7.5–12.5)
Monocytes Relative: 10.7 %
Neutro Abs: 3767 cells/uL (ref 1500–7800)
Neutrophils Relative %: 59.8 %
Platelets: 191 10*3/uL (ref 140–400)
RBC: 4.02 10*6/uL (ref 3.80–5.10)
RDW: 13.5 % (ref 11.0–15.0)
Total Lymphocyte: 25 %
WBC: 6.3 10*3/uL (ref 3.8–10.8)

## 2019-09-15 LAB — COMPLETE METABOLIC PANEL WITH GFR
AG Ratio: 2 (calc) (ref 1.0–2.5)
ALT: 17 U/L (ref 6–29)
AST: 15 U/L (ref 10–35)
Albumin: 3.9 g/dL (ref 3.6–5.1)
Alkaline phosphatase (APISO): 70 U/L (ref 37–153)
BUN/Creatinine Ratio: 19 (calc) (ref 6–22)
BUN: 19 mg/dL (ref 7–25)
CO2: 25 mmol/L (ref 20–32)
Calcium: 8.8 mg/dL (ref 8.6–10.4)
Chloride: 106 mmol/L (ref 98–110)
Creat: 1.02 mg/dL — ABNORMAL HIGH (ref 0.60–0.93)
GFR, Est African American: 62 mL/min/{1.73_m2} (ref >=60)
GFR, Est Non African American: 54 mL/min/{1.73_m2} — ABNORMAL LOW (ref >=60)
Globulin: 2 g/dL (calc) (ref 1.9–3.7)
Glucose, Bld: 113 mg/dL — ABNORMAL HIGH (ref 65–99)
Potassium: 4.4 mmol/L (ref 3.5–5.3)
Sodium: 140 mmol/L (ref 135–146)
Total Bilirubin: 0.4 mg/dL (ref 0.2–1.2)
Total Protein: 5.9 g/dL — ABNORMAL LOW (ref 6.1–8.1)

## 2019-09-15 MED ORDER — ESCITALOPRAM OXALATE 10 MG PO TABS: 10.0000 mg | ORAL_TABLET | Freq: Every day | ORAL | 3 refills | Status: DC

## 2019-09-15 NOTE — Progress Notes (Signed)
Subjective:    Patient ID: Audrey David, female    DOB: 03-30-1945, 74 y.o.   MRN: [id number]  HPI 01/30/19 Patient is battling depression.  Recently sister was diagnosed with metastatic lung cancer with metastasis to the brain.  She is scheduled to go up [place] and help care for her sister.  Uncertain of how long this will take however the prognosis appears bleak.  Family friend recently died from throat cancer.  Patient's father died from throat cancer as well.  All this has her extremely anxious regarding a nodule that was seen on the MRI of her neck.  It was a 14 mm heterogeneous appearing thyroid nodule that was thought to be benign.  However the patient is obviously concerned given the cancer that has been discovered in her family.  Her mother's dementia is drastically worsening.  The entire situation with COVID-19, the quarantine, feeling isolated all have the patient feeling depressed with anhedonia and anxiety and trouble sleeping despite taking Ambien every night.  At that time, my plan was: Begin Lexapro 10 mg a day and reassess in 4 weeks.  Use Xanax 0.5 mg every 8 hours as needed for anxiety.  Do not combine with Ambien.  Reassess in 4 weeks.  Schedule thyroid ultrasound mainly for patient peace of mind.  09/15/19 Patient is here today for follow-up.  When I last saw her in December we started Lexapro for depression.  She thinks that the medication is helping.  She states that she does not feel as anxious and overwhelmed that she did in December.  She is also not felt as sad and is hopeless that she felt in December.  Unfortunately, her sister continues to deteriorate.  They are discontinuing treatment and placing her sister on hospice.  It sounds like her sister may only have weeks to live as she has over 40 new metastasis to her brain.  The patient is flying to spend time with her sister during her last days.  Therefore, the patient feels that the medication will likely be put to his  test.  She is not yet use Xanax however she is taking the Xanax with her in case she feels overwhelmed at times.  Her blood pressure today is actually low.  The patient states at home occasionally she will feel extremely lightheaded like she may pass out.  She denies any chest pain.  She denies any shortness of breath.  She denies any palpitations.  She just feels lightheaded sometimes when she stands up like her blood pressure may be dropping.  Her son-in-law checked her blood pressure during 1 of these episodes and it was extremely low. Past Medical History:  Diagnosis Date  . Arthritis   . 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 Detached retina   . Hemorrhoids   . Hypertension   . Insomnia   . Multinodular goiter (nontoxic)   . Phlebitis    Past Surgical History:  Procedure Laterality Date  . ABDOMINAL HYSTERECTOMY    . COLONOSCOPY    . TUBAL LIGATION     Current Outpatient Medications on File Prior to Visit  Medication Sig Dispense Refill  . ALPRAZolam (XANAX) 0.5 MG tablet Take 1 tablet (0.5 mg total) by mouth 3 (three) times daily as needed for anxiety. 30 tablet 0  . APPLE CIDER VINEGAR PO Take 1 capsule by mouth daily.    . Ascorbic Acid (VITAMIN C PO) Take  1 tablet by mouth daily.    . Cholecalciferol (VITAMIN D3) 10 MCG (400 UNIT) CAPS TAKE 2 CAPSULES DAILY 180 capsule 3  . cyclobenzaprine (FLEXERIL) 10 MG tablet Take 1 tablet (10 mg total) by mouth 2 (two) times daily as needed for muscle spasms. 20 tablet 0  . escitalopram (LEXAPRO) 10 MG tablet Take 1 tablet (10 mg total) by mouth daily. 30 tablet 0  . losartan (COZAAR) 100 MG tablet TAKE ONE-HALF (1/2) TABLET DAILY 45 tablet 3  . naproxen (NAPROSYN) 500 MG tablet Take 1 tablet (500 mg total) by mouth 2 (two) times daily as needed. 30 tablet 0  . oxyCODONE-acetaminophen (PERCOCET) 7.5-325 MG tablet Take 1 tablet by mouth every 4 (four) hours as needed for severe pain. 30  tablet 0  . tiZANidine (ZANAFLEX) 4 MG tablet Take 1 tablet (4 mg total) by mouth 2 (two) times daily. 180 tablet 0  . Turmeric 500 MG TABS Take 500 mg by mouth 2 (two) times daily.    Marland Kitchen VITAMIN E PO Take 1 tablet by mouth daily.    Marland Kitchen zolpidem (AMBIEN CR) 12.5 MG CR tablet Take 1 tablet (12.5 mg total) by mouth at bedtime. 10 tablet 0   No current facility-administered medications on file prior to visit.   Allergies  Allergen Reactions  . Ace Inhibitors Cough  . Codeine Nausea Only    60 years ago   Social History   Socioeconomic History  . Marital status: Single    Spouse name: Not on file  . Number of children: Not on file  . Years of education: Not on file  . Highest education level: Not on file  Occupational History  . Not on file  Tobacco Use  . Smoking status: Former Games developer  . Smokeless tobacco: Never Used  . Tobacco comment:  " quit smoking cigarettes in 2007 "  Substance and Sexual Activity  . Alcohol use: Yes    Comment: Social   . Drug use: No  . Sexual activity: Not on file  Other Topics Concern  . Not on file  Social History Narrative   Daily caffeine    Social Determinants of Health   Financial Resource Strain:   . Difficulty of Paying Living Expenses:   Food Insecurity:   . Worried About Programme researcher, broadcasting/film/video in the Last Year:   . Barista in the Last Year:   Transportation Needs:   . Freight forwarder (Medical):   Marland Kitchen Lack of Transportation (Non-Medical):   Physical Activity:   . Days of Exercise per Week:   . Minutes of Exercise per Session:   Stress:   . Feeling of Stress :   Social Connections:   . Frequency of Communication with Friends and Family:   . Frequency of Social Gatherings with Friends and Family:   . Attends Religious Services:   . Active Member of Clubs or Organizations:   . Attends Banker Meetings:   Marland Kitchen Marital Status:   Intimate Partner Violence:   . Fear of Current or Ex-Partner:   . Emotionally  Abused:   Marland Kitchen Physically Abused:   . Sexually Abused:      Review of Systems  All other systems reviewed and are negative.      Objective:   Physical Exam Vitals reviewed.  Constitutional:      General: She is not in acute distress.    Appearance: She is well-developed. She is not ill-appearing or toxic-appearing.  HENT:     Head: Normocephalic and atraumatic.  Eyes:     Pupils: Pupils are equal, round, and reactive to light.  Cardiovascular:     Rate and Rhythm: Normal rate and regular rhythm.     Heart sounds: Normal heart sounds. No murmur heard.   Pulmonary:     Effort: Pulmonary effort is normal. No respiratory distress.     Breath sounds: Normal breath sounds. No wheezing.  Abdominal:     General: Bowel sounds are normal.     Palpations: Abdomen is soft.  Neurological:     Mental Status: She is alert and oriented to person, place, and time. She is not disoriented.     Cranial Nerves: No cranial nerve deficit.     Sensory: No sensory deficit.     Coordination: Coordination normal.     Gait: Gait normal.         Assessment & Plan:  Depression, major, single episode, moderate (HCC) - Plan: CBC with Differential/Platelet, COMPLETE METABOLIC PANEL WITH GFR  Continue Lexapro 10 mg a day.  Use Xanax 0.5 mg every 8 hours as needed for anxiety.  I believe the patient may be having orthostatic hypotension.  Discontinue losartan.  Have asked the patient to check her blood pressure every day.  If consistently greater than 140/90 we can resume the losartan however it sounds like she may be overmedicated.  I also asked her to check her heart rate and blood pressure whenever she feels lightheaded and record these values.  If the patient continues to have these episodes after stopping the medication I would recommend an event monitor.  Check CBC today to rule out anemia as well as a CMP to evaluate for any electrolyte disturbances

## 2019-09-29 ENCOUNTER — Telehealth: Payer: Self-pay | Admitting: *Deleted

## 2019-09-29 NOTE — Telephone Encounter (Signed)
Pt left vm stating she could not remember if she got her test results. I called pt back and had to leave a message to return call to discuss.

## 2019-09-30 ENCOUNTER — Ambulatory Visit: Payer: Medicare Other | Admitting: Nurse Practitioner

## 2019-10-01 ENCOUNTER — Telehealth: Payer: Self-pay

## 2019-10-01 NOTE — Telephone Encounter (Signed)
Spoke with Pt and gave her labs again from 09/15/2019. She verbalized understanding of lab results given.

## 2019-10-01 NOTE — Telephone Encounter (Signed)
Called Audrey David trying to reach her about her labs that I called her for back in July, Audrey David then verbalized understanding of the results that I gave her. [name] the nurse that worked on Monday 09/29/2019 made a note that Audrey David called her and couldn't remember if she had received her lab results. [name] did make a note of this encounter. When [name] called her back she was not able to reach Audrey David. Wednesday 10/01/2019 I tried reaching Audrey David, and finally reached out to her daughter Audrey David. Audrey David then started mentioning her concerns of her Mom. Audrey David is starting to be very forgetful  as Audrey David mention like suddenly, she's repeating herself, she's starting to cook and forgetting about it on the stove, [name] which is a captain with [employer], has had to go over multiple times and check BP. Audrey David her daughter said if you'd like to call her please do.

## 2019-10-02 NOTE — Telephone Encounter (Signed)
I want to see her, so I can order MRI and have it approved.

## 2019-10-08 ENCOUNTER — Ambulatory Visit: Payer: Self-pay | Admitting: Nurse Practitioner

## 2019-10-09 ENCOUNTER — Ambulatory Visit (INDEPENDENT_AMBULATORY_CARE_PROVIDER_SITE_OTHER): Payer: Medicare Other | Admitting: Nurse Practitioner

## 2019-10-09 ENCOUNTER — Other Ambulatory Visit: Payer: Self-pay

## 2019-10-09 VITALS — BP 142/80 | HR 74 | Temp 98.1°F | Resp 18 | Wt 151.2 lb

## 2019-10-09 DIAGNOSIS — L237 Allergic contact dermatitis due to plants, except food: Secondary | ICD-10-CM

## 2019-10-09 MED ORDER — TIZANIDINE HCL 4 MG PO TABS: 4.0000 mg | ORAL_TABLET | Freq: Two times a day (BID) | ORAL | 0 refills | Status: DC

## 2019-10-09 MED ORDER — PREDNISONE 10 MG PO TABS: ORAL_TABLET | ORAL | 0 refills | Status: DC

## 2019-10-09 NOTE — Telephone Encounter (Signed)
Requested Prescriptions   Pending Prescriptions Disp Refills  . tiZANidine (ZANAFLEX) 4 MG tablet 180 tablet 0    Sig: Take 1 tablet (4 mg total) by mouth 2 (two) times daily.     Last OV 10/08/2019   Last written 07/28/2019

## 2019-10-09 NOTE — Progress Notes (Signed)
Established Patient Office Visit  Subjective:  Patient ID: Murle Hellstrom, female    DOB: Oct 03, 1945  Age: 74 y.o. MRN: [id number]  CC:  Chief Complaint  Patient presents with  . Poison Ivy    L arm, stomach, allergy meds taken (benedryl), started x2 days    HPI Ruchama Kubicek  Is a 74 year old female that presents to the clinic r/t a rash that appeared two days ago. She was performing yard work pulling weeds and knows that she was exposed to poison ivy plant. That same day she developed pruritic, erythremic, raised rash with a few clear fluid filled blisters to bilateral lower arms and one area to her chest just below her inner lower portion of the left breast. No txs tried. No drainage, odor, fever, chills, red streaking. No others with similar sxs. No gu/gi sxs.  The pt is also needing a refill on Zanaflex $RemoveBef'4mg'yINwUVWIde$  [facility] but she needs Dr Dennard Schaumann to send as [facility] stated they can not find [facility] in the system therefor she desires him to fill for future references. She does have a few left however the pharmacy did reach out to her to notify her that a refill was due. PCP will be notified today of this request.   Past Medical History:  Diagnosis Date  . Arthritis   . 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 Detached retina   . Hemorrhoids   . Hypertension   . Insomnia   . Multinodular goiter (nontoxic)   . Phlebitis     Past Surgical History:  Procedure Laterality Date  . ABDOMINAL HYSTERECTOMY    . COLONOSCOPY    . TUBAL LIGATION      Family History  Problem Relation Age of Onset  . Hypertension Mother   . Cancer Father 7       multiple myeloma. Throat Cancer.  . Breast cancer Neg Hx     Social History   Socioeconomic History  . Marital status: Single    Spouse name: Not on file  . Number of children: Not on file  . Years of education: Not on file  . Highest education level: Not on  file  Occupational History  . Not on file  Tobacco Use  . Smoking status: Former Research scientist (life sciences)  . Smokeless tobacco: Never Used  . Tobacco comment:  " quit smoking cigarettes in 2007 "  Substance and Sexual Activity  . Alcohol use: Yes    Comment: Social   . Drug use: No  . Sexual activity: Not on file  Other Topics Concern  . Not on file  Social History Narrative   Daily caffeine    Social Determinants of Health   Financial Resource Strain:   . Difficulty of Paying Living Expenses: Not on file  Food Insecurity:   . Worried About Charity fundraiser in the Last Year: Not on file  . Ran Out of Food in the Last Year: Not on file  Transportation Needs:   . Lack of Transportation (Medical): Not on file  . Lack of Transportation (Non-Medical): Not on file  Physical Activity:   . Days of Exercise per Week: Not on file  . Minutes of Exercise per Session: Not on file  Stress:   . Feeling of Stress : Not on file  Social Connections:   . Frequency of Communication with Friends and Family: Not on file  .  Frequency of Social Gatherings with Friends and Family: Not on file  . Attends Religious Services: Not on file  . Active Member of Clubs or Organizations: Not on file  . Attends Archivist Meetings: Not on file  . Marital Status: Not on file  Intimate Partner Violence:   . Fear of Current or Ex-Partner: Not on file  . Emotionally Abused: Not on file  . Physically Abused: Not on file  . Sexually Abused: Not on file    Outpatient Medications Prior to Visit  Medication Sig Dispense Refill  . ALPRAZolam (XANAX) 0.5 MG tablet Take 1 tablet (0.5 mg total) by mouth 3 (three) times daily as needed for anxiety. 30 tablet 0  . APPLE CIDER VINEGAR PO Take 1 capsule by mouth daily.    . Ascorbic Acid (VITAMIN C PO) Take 1 tablet by mouth daily.    . Cholecalciferol (VITAMIN D3) 10 MCG (400 UNIT) CAPS TAKE 2 CAPSULES DAILY 180 capsule 3  . escitalopram (LEXAPRO) 10 MG tablet Take 1  tablet (10 mg total) by mouth daily. 90 tablet 3  . losartan (COZAAR) 100 MG tablet TAKE ONE-HALF (1/2) TABLET DAILY 45 tablet 3  . Multiple Vitamins-Minerals (PRESERVISION AREDS PO) Take by mouth.    . naproxen (NAPROSYN) 500 MG tablet Take 1 tablet (500 mg total) by mouth 2 (two) times daily as needed. 30 tablet 0  . tiZANidine (ZANAFLEX) 4 MG tablet Take 1 tablet (4 mg total) by mouth 2 (two) times daily. 180 tablet 0  . Turmeric 500 MG TABS Take 500 mg by mouth 2 (two) times daily.    Marland Kitchen VITAMIN E PO Take 1 tablet by mouth daily.    Marland Kitchen zolpidem (AMBIEN CR) 12.5 MG CR tablet Take 1 tablet (12.5 mg total) by mouth at bedtime. 10 tablet 0  . cyclobenzaprine (FLEXERIL) 10 MG tablet Take 1 tablet (10 mg total) by mouth 2 (two) times daily as needed for muscle spasms. 20 tablet 0  . oxyCODONE-acetaminophen (PERCOCET) 7.5-325 MG tablet Take 1 tablet by mouth every 4 (four) hours as needed for severe pain. 30 tablet 0   No facility-administered medications prior to visit.    Allergies  Allergen Reactions  . Ace Inhibitors Cough  . Codeine Nausea Only    60 years ago    ROS Review of Systems  All other systems reviewed and are negative.     Objective:    Physical Exam Vitals and nursing note reviewed.  Constitutional:      Appearance: Normal appearance.  Eyes:     Extraocular Movements: Extraocular movements intact.     Conjunctiva/sclera: Conjunctivae normal.     Pupils: Pupils are equal, round, and reactive to light.  Cardiovascular:     Rate and Rhythm: Normal rate.  Pulmonary:     Effort: Pulmonary effort is normal.  Musculoskeletal:     Cervical back: Normal range of motion and neck supple.  Skin:    General: Skin is warm.     Findings: Rash present.       Neurological:     General: No focal deficit present.     Mental Status: She is alert and oriented to person, place, and time.  Psychiatric:        Mood and Affect: Mood normal.        Thought Content: Thought  content normal.        Judgment: Judgment normal.     BP (!) 142/80 (BP Location: Left Arm, Patient  Position: Sitting, Cuff Size: Normal)   Pulse 74   Temp 98.1 F (36.7 C) (Temporal)   Resp 18   Wt 151 lb 3.2 oz (68.6 kg)   SpO2 96%   BMI 25.95 kg/m  Wt Readings from Last 3 Encounters:  10/09/19 151 lb 3.2 oz (68.6 kg)  01/30/19 151 lb (68.5 kg)  10/31/18 148 lb (67.1 kg)     Health Maintenance Due  Topic Date Due  . Hepatitis C Screening  Never done  . COLONOSCOPY  09/05/2015  . INFLUENZA VACCINE  09/21/2019    There are no preventive care reminders to display for this patient.  Lab Results  Component Value Date   TSH 1.79 02/28/2016   Lab Results  Component Value Date   WBC 6.3 09/15/2019   HGB 11.3 (L) 09/15/2019   HCT 35.0 09/15/2019   MCV 87.1 09/15/2019   PLT 191 09/15/2019   Lab Results  Component Value Date   NA 140 09/15/2019   K 4.4 09/15/2019   CO2 25 09/15/2019   GLUCOSE 113 (H) 09/15/2019   BUN 19 09/15/2019   CREATININE 1.02 (H) 09/15/2019   BILITOT 0.4 09/15/2019   ALKPHOS 73 02/28/2016   AST 15 09/15/2019   ALT 17 09/15/2019   PROT 5.9 (L) 09/15/2019   ALBUMIN 4.0 02/28/2016   CALCIUM 8.8 09/15/2019   Lab Results  Component Value Date   CHOL 190 02/28/2016   Lab Results  Component Value Date   HDL 54 02/28/2016   Lab Results  Component Value Date   LDLCALC 115 (H) 02/28/2016   Lab Results  Component Value Date   TRIG 105 02/28/2016   Lab Results  Component Value Date   CHOLHDL 3.5 02/28/2016   No results found for: HGBA1C    Assessment & Plan:   Problem List Items Addressed This Visit    None    Visit Diagnoses    Allergic contact dermatitis due to plant    -  Primary   Relevant Medications   predniSONE (DELTASONE) 10 MG tablet     May take Benadryl prn at night for itching  May take Claritin 10 mg  in AM  May take Pepcid 20 mg twice daily  May use calamine drying agent avoid all other topicals Take  prednisone as directed   Meds ordered this encounter  Medications  . predniSONE (DELTASONE) 10 MG tablet    Sig: Day one take 6 tablets Day two take 5 tablets Day three take 4 tablets Day four take 3 tablets Day five take 2 tablets Day six take 1 tablet orally    Dispense:  21 tablet    Refill:  0    Follow-up: Return if symptoms worsen or fail to improve.    Annie Main, FNP

## 2019-10-09 NOTE — Patient Instructions (Signed)
May take Benadryl prn at night for itching  May take Claritin 10 mg  in AM  May take Pepcid 20 mg twice daily  May use calamine drying agent avoid all other topicals Take prednisone as directed

## 2019-10-09 NOTE — Telephone Encounter (Signed)
error 

## 2019-10-13 ENCOUNTER — Ambulatory Visit
Admission: RE | Admit: 2019-10-13 | Discharge: 2019-10-13 | Disposition: A | Discharge status: Home / Self Care | Payer: Medicare Other | Source: Ambulatory Visit | Attending: Family Medicine | Admitting: Family Medicine

## 2019-10-13 ENCOUNTER — Telehealth: Payer: Self-pay | Admitting: Family Medicine

## 2019-10-13 ENCOUNTER — Other Ambulatory Visit: Payer: Self-pay

## 2019-10-13 DIAGNOSIS — Z1231 Encounter for screening mammogram for malignant neoplasm of breast: Secondary | ICD-10-CM

## 2019-10-13 NOTE — Telephone Encounter (Signed)
Patient left vm stating she needed her trizandine sent to the mail order [facility], I did leave her a message to verify that was the medication she needed.   CB# [phone]

## 2019-10-14 ENCOUNTER — Other Ambulatory Visit: Payer: Self-pay

## 2019-10-14 MED ORDER — TIZANIDINE HCL 4 MG PO TABS: 4.0000 mg | ORAL_TABLET | Freq: Two times a day (BID) | ORAL | 0 refills | Status: DC

## 2019-10-14 NOTE — Telephone Encounter (Signed)
Sent to Dr Tanya Nones

## 2019-10-14 NOTE — Telephone Encounter (Signed)
Requested Prescriptions   Pending Prescriptions Disp Refills   tiZANidine (ZANAFLEX) 4 MG tablet 180 tablet 0    Sig: Take 1 tablet (4 mg total) by mouth 2 (two) times daily.     Pt would like the Rx to go to [facility].

## 2019-10-22 ENCOUNTER — Other Ambulatory Visit: Payer: Self-pay

## 2019-10-23 MED ORDER — ZOLPIDEM TARTRATE ER 12.5 MG PO TBCR: 12.5000 mg | EXTENDED_RELEASE_TABLET | Freq: Every day | ORAL | 0 refills | Status: DC

## 2019-11-05 ENCOUNTER — Telehealth: Payer: Self-pay | Admitting: Family Medicine

## 2019-11-05 ENCOUNTER — Other Ambulatory Visit: Payer: Self-pay

## 2019-11-05 NOTE — Telephone Encounter (Signed)
Requested Prescriptions   Pending Prescriptions Disp Refills  . zolpidem (AMBIEN CR) 12.5 MG CR tablet 10 tablet 0    Sig: Take 1 tablet (12.5 mg total) by mouth at bedtime.     Last OV 10/09/2019   Last written 10/23/2019

## 2019-11-05 NOTE — Telephone Encounter (Signed)
Refill Ambien

## 2019-11-06 MED ORDER — ZOLPIDEM TARTRATE ER 12.5 MG PO TBCR: 12.5000 mg | EXTENDED_RELEASE_TABLET | Freq: Every day | ORAL | 0 refills | Status: DC

## 2019-11-25 ENCOUNTER — Other Ambulatory Visit: Payer: Self-pay

## 2019-11-25 MED ORDER — ZOLPIDEM TARTRATE ER 12.5 MG PO TBCR: 12.5000 mg | EXTENDED_RELEASE_TABLET | Freq: Every day | ORAL | 0 refills | Status: DC

## 2019-11-25 NOTE — Telephone Encounter (Signed)
Audrey David would like her Ambien in a 90 day supply if can.

## 2019-12-08 ENCOUNTER — Other Ambulatory Visit: Payer: Self-pay | Admitting: Family Medicine

## 2019-12-08 NOTE — Telephone Encounter (Signed)
#   [phone] Refill Ambien for 90 days would like to know if Dr.Pickard suggest her to have the booster shot first or the flu shot first [facility] - [place], [place] - [address]

## 2019-12-09 MED ORDER — ZOLPIDEM TARTRATE ER 12.5 MG PO TBCR: 12.5000 mg | EXTENDED_RELEASE_TABLET | Freq: Every day | ORAL | 0 refills | Status: DC

## 2019-12-09 NOTE — Telephone Encounter (Signed)
1. Ok to refill Ambien to mail order?? Last office visit 10/09/2019. Last refill 11/25/2019.  2. Please clarify quantity to be given via mail order. Refill on 11/25/2019 gave #10 tabs.   3. Please advise on COVID Booster Vs Flu shot.

## 2020-01-02 ENCOUNTER — Other Ambulatory Visit: Payer: Self-pay

## 2020-01-02 ENCOUNTER — Ambulatory Visit (INDEPENDENT_AMBULATORY_CARE_PROVIDER_SITE_OTHER): Payer: Medicare Other | Admitting: *Deleted

## 2020-01-02 DIAGNOSIS — Z23 Encounter for immunization: Secondary | ICD-10-CM | POA: Diagnosis not present

## 2020-01-07 ENCOUNTER — Other Ambulatory Visit: Payer: Self-pay | Admitting: Family Medicine

## 2020-01-08 NOTE — Telephone Encounter (Signed)
Ok to refill 

## 2020-03-16 ENCOUNTER — Telehealth: Payer: Self-pay | Admitting: Family Medicine

## 2020-03-16 NOTE — Telephone Encounter (Signed)
[facility] for zolpidem (AMBIEN CR) 12.5 MG CR tablet

## 2020-03-18 ENCOUNTER — Other Ambulatory Visit: Payer: Self-pay

## 2020-03-18 ENCOUNTER — Other Ambulatory Visit: Payer: Self-pay | Admitting: Family Medicine

## 2020-03-18 MED ORDER — ZOLPIDEM TARTRATE ER 12.5 MG PO TBCR: 12.5000 mg | EXTENDED_RELEASE_TABLET | Freq: Every day | ORAL | 0 refills | Status: DC

## 2020-03-18 NOTE — Telephone Encounter (Signed)
To PCP

## 2020-04-01 ENCOUNTER — Encounter: Payer: Self-pay | Admitting: *Deleted

## 2020-05-11 ENCOUNTER — Other Ambulatory Visit: Payer: Medicare Other

## 2020-05-11 ENCOUNTER — Other Ambulatory Visit: Payer: Self-pay

## 2020-05-11 DIAGNOSIS — M8580 Other specified disorders of bone density and structure, unspecified site: Secondary | ICD-10-CM

## 2020-05-11 DIAGNOSIS — Z1159 Encounter for screening for other viral diseases: Secondary | ICD-10-CM

## 2020-05-11 DIAGNOSIS — E559 Vitamin D deficiency, unspecified: Secondary | ICD-10-CM

## 2020-05-11 DIAGNOSIS — Z1329 Encounter for screening for other suspected endocrine disorder: Secondary | ICD-10-CM

## 2020-05-11 DIAGNOSIS — Z13220 Encounter for screening for lipoid disorders: Secondary | ICD-10-CM

## 2020-05-11 DIAGNOSIS — I10 Essential (primary) hypertension: Secondary | ICD-10-CM

## 2020-05-12 LAB — CBC WITH DIFFERENTIAL/PLATELET
Absolute Monocytes: 636 cells/uL (ref 200–950)
Basophils Absolute: 48 cells/uL (ref 0–200)
Basophils Relative: 0.8 %
Eosinophils Absolute: 318 cells/uL (ref 15–500)
Eosinophils Relative: 5.3 %
HCT: 38.7 % (ref 35.0–45.0)
Hemoglobin: 12.4 g/dL (ref 11.7–15.5)
Lymphs Abs: 2556 cells/uL (ref 850–3900)
MCH: 27.5 pg (ref 27.0–33.0)
MCHC: 32 g/dL (ref 32.0–36.0)
MCV: 85.8 fL (ref 80.0–100.0)
MPV: 11.5 fL (ref 7.5–12.5)
Monocytes Relative: 10.6 %
Neutro Abs: 2442 cells/uL (ref 1500–7800)
Neutrophils Relative %: 40.7 %
Platelets: 208 10*3/uL (ref 140–400)
RBC: 4.51 10*6/uL (ref 3.80–5.10)
RDW: 13.7 % (ref 11.0–15.0)
Total Lymphocyte: 42.6 %
WBC: 6 10*3/uL (ref 3.8–10.8)

## 2020-05-12 LAB — HEPATITIS C ANTIBODY
Hepatitis C Ab: NONREACTIVE
SIGNAL TO CUT-OFF: 0.01 (ref <1.00)

## 2020-05-12 LAB — COMPLETE METABOLIC PANEL WITH GFR
AG Ratio: 1.9 (calc) (ref 1.0–2.5)
ALT: 21 U/L (ref 6–29)
AST: 16 U/L (ref 10–35)
Albumin: 4.3 g/dL (ref 3.6–5.1)
Alkaline phosphatase (APISO): 76 U/L (ref 37–153)
BUN/Creatinine Ratio: 22 (calc) (ref 6–22)
BUN: 22 mg/dL (ref 7–25)
CO2: 27 mmol/L (ref 20–32)
Calcium: 9.4 mg/dL (ref 8.6–10.4)
Chloride: 105 mmol/L (ref 98–110)
Creat: 1.01 mg/dL — ABNORMAL HIGH (ref 0.60–0.93)
GFR, Est African American: 63 mL/min/{1.73_m2} (ref >=60)
GFR, Est Non African American: 54 mL/min/{1.73_m2} — ABNORMAL LOW (ref >=60)
Globulin: 2.3 g/dL (calc) (ref 1.9–3.7)
Glucose, Bld: 87 mg/dL (ref 65–99)
Potassium: 4.4 mmol/L (ref 3.5–5.3)
Sodium: 142 mmol/L (ref 135–146)
Total Bilirubin: 0.5 mg/dL (ref 0.2–1.2)
Total Protein: 6.6 g/dL (ref 6.1–8.1)

## 2020-05-12 LAB — LIPID PANEL
Cholesterol: 199 mg/dL (ref <200)
HDL: 52 mg/dL (ref >=50)
LDL Cholesterol (Calc): 121 mg/dL (calc) — ABNORMAL HIGH
Non-HDL Cholesterol (Calc): 147 mg/dL (calc) — ABNORMAL HIGH (ref <130)
Total CHOL/HDL Ratio: 3.8 (calc) (ref <5.0)
Triglycerides: 147 mg/dL (ref <150)

## 2020-05-12 LAB — VITAMIN D 25 HYDROXY (VIT D DEFICIENCY, FRACTURES): Vit D, 25-Hydroxy: 34 ng/mL (ref 30–100)

## 2020-05-12 LAB — TSH: TSH: 3.53 mIU/L (ref 0.40–4.50)

## 2020-05-18 ENCOUNTER — Other Ambulatory Visit: Payer: Self-pay

## 2020-05-18 ENCOUNTER — Encounter: Payer: Self-pay | Admitting: Family Medicine

## 2020-05-18 ENCOUNTER — Ambulatory Visit: Payer: Medicare Other | Admitting: Family Medicine

## 2020-05-18 VITALS — BP 132/88 | HR 88 | Temp 98.2°F | Resp 14 | Ht 64.0 in | Wt 155.0 lb

## 2020-05-18 DIAGNOSIS — E042 Nontoxic multinodular goiter: Secondary | ICD-10-CM

## 2020-05-18 DIAGNOSIS — F321 Major depressive disorder, single episode, moderate: Secondary | ICD-10-CM

## 2020-05-18 DIAGNOSIS — I10 Essential (primary) hypertension: Secondary | ICD-10-CM | POA: Diagnosis not present

## 2020-05-18 DIAGNOSIS — M5030 Other cervical disc degeneration, unspecified cervical region: Secondary | ICD-10-CM

## 2020-05-18 DIAGNOSIS — Z0001 Encounter for general adult medical examination with abnormal findings: Secondary | ICD-10-CM

## 2020-05-18 DIAGNOSIS — G8929 Other chronic pain: Secondary | ICD-10-CM

## 2020-05-18 DIAGNOSIS — M25561 Pain in right knee: Secondary | ICD-10-CM

## 2020-05-18 DIAGNOSIS — Z0000 Encounter for general adult medical examination without abnormal findings: Secondary | ICD-10-CM

## 2020-05-18 DIAGNOSIS — M25511 Pain in right shoulder: Secondary | ICD-10-CM

## 2020-05-18 MED ORDER — ZOLPIDEM TARTRATE ER 12.5 MG PO TBCR: 12.5000 mg | EXTENDED_RELEASE_TABLET | Freq: Every day | ORAL | 0 refills | Status: DC

## 2020-05-18 NOTE — Progress Notes (Signed)
Subjective:    Patient ID: Audrey David, female    DOB: 1945-11-11, 75 y.o.   MRN: [id number]  HPI Patient is a very pleasant 75 year old Caucasian female here today for complete physical exam.  Her last mammogram was performed last fall and is normal.  She is long overdue for a colonoscopy and she will finally allow me to schedule this for her.  She does not require Pap smear due to age.  Her most recent immunizations are listed below.  She is due for Shingrix.  Otherwise she is doing well.  Her most recent lab work is listed below and shows only stage III chronic kidney disease but is otherwise normal.  She feels that her depression is stable and doing better.  She may consider weaning off the Lexapro but she would like to wait till she gets back from an upcoming trip.  She denies any falls or memory loss.  She does complain of pain in her right shoulder when she sleeps at night that sounds concerning for subacromial bursitis.  She also complains of pain in her right knee that sounds like osteoarthritis.  She has not had any x-rays of this. Immunization History  Administered Date(s) Administered  . Fluad Quad(high Dose 65+) 01/02/2020  . PFIZER(Purple Top)SARS-COV-2 Vaccination 05/08/2019, 06/02/2019, 02/04/2020  . Pneumococcal Conjugate-13 05/13/2013  . Pneumococcal Polysaccharide-23 02/28/2016  . Tdap 11/02/2011  . Zoster 07/13/2010   Lab on 05/11/2020  Component Date Value Ref Range Status  . Glucose, Bld 05/11/2020 87  65 - 99 mg/dL Final   Comment: .            Fasting reference interval .   . BUN 05/11/2020 22  7 - 25 mg/dL Final  . Creat 05/11/2020 1.01* 0.60 - 0.93 mg/dL Final   Comment: For patients >56 years of age, the reference limit for Creatinine is approximately 13% higher for people identified as African-American. .   . GFR, Est Non African American 05/11/2020 54* > OR = 60 mL/min/1.36m Final  . GFR, Est African American 05/11/2020 63  > OR = 60 mL/min/1.759m Final  . BUN/Creatinine Ratio 05/11/2020 22  6 - 22 (calc) Final  . Sodium 05/11/2020 142  135 - 146 mmol/L Final  . Potassium 05/11/2020 4.4  3.5 - 5.3 mmol/L Final  . Chloride 05/11/2020 105  98 - 110 mmol/L Final  . CO2 05/11/2020 27  20 - 32 mmol/L Final  . Calcium 05/11/2020 9.4  8.6 - 10.4 mg/dL Final  . Total Protein 05/11/2020 6.6  6.1 - 8.1 g/dL Final  . Albumin 05/11/2020 4.3  3.6 - 5.1 g/dL Final  . Globulin 05/11/2020 2.3  1.9 - 3.7 g/dL (calc) Final  . AG Ratio 05/11/2020 1.9  1.0 - 2.5 (calc) Final  . Total Bilirubin 05/11/2020 0.5  0.2 - 1.2 mg/dL Final  . Alkaline phosphatase (APISO) 05/11/2020 76  37 - 153 U/L Final  . AST 05/11/2020 16  10 - 35 U/L Final  . ALT 05/11/2020 21  6 - 29 U/L Final  . WBC 05/11/2020 6.0  3.8 - 10.8 Thousand/uL Final  . RBC 05/11/2020 4.51  3.80 - 5.10 Million/uL Final  . Hemoglobin 05/11/2020 12.4  11.7 - 15.5 g/dL Final  . HCT 05/11/2020 38.7  35.0 - 45.0 % Final  . MCV 05/11/2020 85.8  80.0 - 100.0 fL Final  . MCH 05/11/2020 27.5  27.0 - 33.0 pg Final  . MCHC 05/11/2020 32.0  32.0 - 36.0  g/dL Final  . RDW 05/11/2020 13.7  11.0 - 15.0 % Final  . Platelets 05/11/2020 208  140 - 400 Thousand/uL Final  . MPV 05/11/2020 11.5  7.5 - 12.5 fL Final  . Neutro Abs 05/11/2020 2,442  1,500 - 7,800 cells/uL Final  . Lymphs Abs 05/11/2020 2,556  850 - 3,900 cells/uL Final  . Absolute Monocytes 05/11/2020 636  200 - 950 cells/uL Final  . Eosinophils Absolute 05/11/2020 318  15 - 500 cells/uL Final  . Basophils Absolute 05/11/2020 48  0 - 200 cells/uL Final  . Neutrophils Relative % 05/11/2020 40.7  % Final  . Total Lymphocyte 05/11/2020 42.6  % Final  . Monocytes Relative 05/11/2020 10.6  % Final  . Eosinophils Relative 05/11/2020 5.3  % Final  . Basophils Relative 05/11/2020 0.8  % Final  . Cholesterol 05/11/2020 199  <200 mg/dL Final  . HDL 05/11/2020 52  > OR = 50 mg/dL Final  . Triglycerides 05/11/2020 147  <150 mg/dL Final  . LDL Cholesterol  (Calc) 05/11/2020 121* mg/dL (calc) Final   Comment: Reference range: <100 . Desirable range <100 mg/dL for primary prevention;   <70 mg/dL for patients with CHD or diabetic patients  with > or = 2 CHD risk factors. Marland Kitchen LDL-C is now calculated using the Martin-Hopkins  calculation, which is a validated novel method providing  better accuracy than the Friedewald equation in the  estimation of LDL-C.  Cresenciano Genre et al. Annamaria Helling. 7829;562(13): 2061-2068  (http://education.[facility].com/faq/FAQ164)   . Total CHOL/HDL Ratio 05/11/2020 3.8  <5.0 (calc) Final  . Non-HDL Cholesterol (Calc) 05/11/2020 147* <130 mg/dL (calc) Final   Comment: For patients with diabetes plus 1 major ASCVD risk  factor, treating to a non-HDL-C goal of <100 mg/dL  (LDL-C of <70 mg/dL) is considered a therapeutic  option.   . Hepatitis C Ab 05/11/2020 NON-REACTIVE  NON-REACTI Final  . SIGNAL TO CUT-OFF 05/11/2020 0.01  <1.00 Final   Comment: . HCV antibody was non-reactive. There is no laboratory  evidence of HCV infection. . In most cases, no further action is required. However, if recent HCV exposure is suspected, a test for HCV RNA (test code [phone]) is suggested. . For additional information please refer to http://education.[facility].com/faq/FAQ22v1 (This link is being provided for informational/ educational purposes only.) .   Marland Kitchen Vit D, 25-Hydroxy 05/11/2020 34  30 - 100 ng/mL Final   Comment: Vitamin D Status         25-OH Vitamin D: . Deficiency:                    <20 ng/mL Insufficiency:             20 - 29 ng/mL Optimal:                 > or = 30 ng/mL . For 25-OH Vitamin D testing on patients on  D2-supplementation and patients for whom quantitation  of D2 and D3 fractions is required, the [facility]ure[facility](TM) 25-OH VIT D, (D2,D3), LC/MS/MS is recommended: order  code [id number] (patients >63yr). See Note 1 . Note 1 . For additional information, please refer to   http://education.[facility].com/faq/FAQ199  (This link is being provided for informational/ educational purposes only.)   . TSH 05/11/2020 3.53  0.40 - 4.50 mIU/L Final    Past Medical History:  Diagnosis Date  . Arthritis   . 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 Detached  retina   . Hemorrhoids   . Hypertension   . Insomnia   . Multinodular goiter (nontoxic)   . Phlebitis    Past Surgical History:  Procedure Laterality Date  . ABDOMINAL HYSTERECTOMY    . COLONOSCOPY    . EYE SURGERY N/A    [facility] 05/16/2020  . SPINE SURGERY N/A    [facility] 05/16/2020  . TUBAL LIGATION     Current Outpatient Medications on File Prior to Visit  Medication Sig Dispense Refill  . ALPRAZolam (XANAX) 0.5 MG tablet Take 1 tablet (0.5 mg total) by mouth 3 (three) times daily as needed for anxiety. 30 tablet 0  . APPLE CIDER VINEGAR PO Take 1 capsule by mouth daily.    . Ascorbic Acid (VITAMIN C PO) Take 1 tablet by mouth daily.    . Cholecalciferol (VITAMIN D3) 10 MCG (400 UNIT) CAPS TAKE 2 CAPSULES DAILY 180 capsule 3  . escitalopram (LEXAPRO) 10 MG tablet Take 1 tablet (10 mg total) by mouth daily. 90 tablet 3  . losartan (COZAAR) 100 MG tablet TAKE ONE-HALF (1/2) TABLET DAILY 45 tablet 3  . Multiple Vitamins-Minerals (PRESERVISION AREDS PO) Take by mouth.    . naproxen (NAPROSYN) 500 MG tablet Take 1 tablet (500 mg total) by mouth 2 (two) times daily as needed. 30 tablet 0  . predniSONE (DELTASONE) 10 MG tablet Day one take 6 tablets Day two take 5 tablets Day three take 4 tablets Day four take 3 tablets Day five take 2 tablets Day six take 1 tablet orally 21 tablet 0  . tiZANidine (ZANAFLEX) 4 MG tablet TAKE 1 TABLET TWICE A DAY 180 tablet 3  . Turmeric 500 MG TABS Take 500 mg by mouth 2 (two) times daily.    Marland Kitchen VITAMIN E PO Take 1 tablet by mouth daily.    Marland Kitchen zolpidem (AMBIEN CR) 12.5 MG CR tablet Take 1 tablet (12.5 mg total)  by mouth at bedtime. 90 tablet 0   No current facility-administered medications on file prior to visit.   Allergies  Allergen Reactions  . Ace Inhibitors Cough    Other reaction(s): Cough (ALLERGY/intolerance)  . Codeine Nausea Only    60 years ago   Social History   Socioeconomic History  . Marital status: Single    Spouse name: Not on file  . Number of children: Not on file  . Years of education: Not on file  . Highest education level: Not on file  Occupational History  . Not on file  Tobacco Use  . Smoking status: Former Research scientist (life sciences)  . Smokeless tobacco: Never Used  . Tobacco comment:  " quit smoking cigarettes in 2007 "  Substance and Sexual Activity  . Alcohol use: Yes    Comment: Social   . Drug use: No  . Sexual activity: Not on file  Other Topics Concern  . Not on file  Social History Narrative   Daily caffeine    Social Determinants of Health   Financial Resource Strain: Not on file  Food Insecurity: Not on file  Transportation Needs: Not on file  Physical Activity: Not on file  Stress: Not on file  Social Connections: Not on file  Intimate Partner Violence: Not on file   Family History  Problem Relation Age of Onset  . Hypertension Mother   . Cancer Father 72       multiple myeloma. Throat Cancer.  . Breast cancer Neg Hx       Review of Systems  Respiratory:  Negative for apnea, cough, choking, chest tightness, shortness of breath and wheezing.   Cardiovascular: Negative for chest pain, palpitations and leg swelling.  All other systems reviewed and are negative.      Objective:   Physical Exam Vitals reviewed.  Constitutional:      General: She is not in acute distress.    Appearance: Normal appearance. She is well-developed and normal weight. She is not ill-appearing, toxic-appearing or diaphoretic.  HENT:     Head: Normocephalic and atraumatic.     Right Ear: Tympanic membrane, ear canal and external ear normal. There is no impacted cerumen.      Left Ear: Tympanic membrane, ear canal and external ear normal. There is no impacted cerumen.     Nose: Nose normal. No congestion or rhinorrhea.     Mouth/Throat:     Mouth: Mucous membranes are moist.     Pharynx: Oropharynx is clear. No oropharyngeal exudate or posterior oropharyngeal erythema.  Eyes:     Extraocular Movements: Extraocular movements intact.     Conjunctiva/sclera: Conjunctivae normal.     Pupils: Pupils are equal, round, and reactive to light.  Neck:     Thyroid: No thyromegaly.     Vascular: No carotid bruit or JVD.  Cardiovascular:     Rate and Rhythm: Normal rate and regular rhythm.     Heart sounds: Normal heart sounds. No murmur heard. No friction rub. No gallop.   Pulmonary:     Effort: Pulmonary effort is normal. No respiratory distress.     Breath sounds: Normal breath sounds. No stridor. No wheezing, rhonchi or rales.  Chest:     Chest wall: No tenderness.  Abdominal:     General: Bowel sounds are normal. There is no distension.     Palpations: Abdomen is soft. There is no mass.     Tenderness: There is no abdominal tenderness. There is no guarding or rebound.  Musculoskeletal:     Cervical back: Normal range of motion and neck supple. No rigidity or tenderness.     Right lower leg: No edema.     Left lower leg: No edema.  Lymphadenopathy:     Cervical: No cervical adenopathy.  Skin:    Coloration: Skin is not jaundiced.     Findings: No bruising, erythema, lesion or rash.  Neurological:     General: No focal deficit present.     Mental Status: She is alert and oriented to person, place, and time. Mental status is at baseline.     Cranial Nerves: No cranial nerve deficit.     Sensory: No sensory deficit.     Motor: No weakness or abnormal muscle tone.     Coordination: Coordination normal.     Gait: Gait normal.     Deep Tendon Reflexes: Reflexes are normal and symmetric. Reflexes normal.  Psychiatric:        Mood and Affect: Mood normal.         Behavior: Behavior normal.        Thought Content: Thought content normal.        Judgment: Judgment normal.           Assessment & Plan:  General medical exam - Plan: Ambulatory referral to Gastroenterology  Essential hypertension  Multinodular goiter (nontoxic)  Depression, major, single episode, moderate (HCC)  DDD (degenerative disc disease), cervical  Right shoulder pain, unspecified chronicity - Plan: DG Shoulder Right  Chronic pain of right knee - Plan: DG Knee Complete 4  Views Right  I think the pain in her right shoulder is likely subacromial bursitis or perhaps due to her cervical degenerative disc disease.  I will obtain an x-ray of the right shoulder to evaluate further along with an x-ray of her right knee which I suspect is due to osteoarthritis.  Her mammogram is up-to-date.  I will schedule her for a colonoscopy.  Her blood pressure today is excellent at 132/88.  Her lab work is exceptional.  Regular anticipatory guidance is provided.  I did recommend the Shingrix vaccine.  Otherwise her immunizations are up-to-date.  When she gets back from her vacation I would like to discuss with her weaning off the Lexapro.  She denies any falls or memory loss.

## 2020-05-19 ENCOUNTER — Encounter (INDEPENDENT_AMBULATORY_CARE_PROVIDER_SITE_OTHER): Payer: Self-pay | Admitting: *Deleted

## 2020-05-19 ENCOUNTER — Ambulatory Visit
Admission: RE | Admit: 2020-05-19 | Discharge: 2020-05-19 | Disposition: A | Discharge status: Home / Self Care | Payer: Medicare Other | Source: Ambulatory Visit | Attending: Family Medicine | Admitting: Family Medicine

## 2020-05-19 DIAGNOSIS — M25511 Pain in right shoulder: Secondary | ICD-10-CM

## 2020-05-19 DIAGNOSIS — M25561 Pain in right knee: Secondary | ICD-10-CM

## 2020-05-19 DIAGNOSIS — G8929 Other chronic pain: Secondary | ICD-10-CM

## 2020-07-18 ENCOUNTER — Other Ambulatory Visit: Payer: Self-pay | Admitting: Family Medicine

## 2020-07-23 ENCOUNTER — Other Ambulatory Visit: Payer: Self-pay | Admitting: Family Medicine

## 2020-07-23 ENCOUNTER — Telehealth: Payer: Self-pay | Admitting: *Deleted

## 2020-07-23 MED ORDER — PREDNISONE 20 MG PO TABS: ORAL_TABLET | ORAL | 0 refills | Status: DC

## 2020-07-23 NOTE — Telephone Encounter (Signed)
Received call from patient.   Reports that she was working in her yard and came in contact with poison oak or poison ivy. States that she has broken out in rash and requested Prednisone taper.   Please advise.

## 2020-07-23 NOTE — Telephone Encounter (Signed)
I will send in prednisone.  °

## 2020-07-23 NOTE — Telephone Encounter (Signed)
Call placed to patient and patient made aware per VM.  

## 2020-08-02 ENCOUNTER — Other Ambulatory Visit: Payer: Self-pay

## 2020-08-02 ENCOUNTER — Ambulatory Visit: Payer: Medicare Other

## 2020-09-06 ENCOUNTER — Other Ambulatory Visit: Payer: Self-pay | Admitting: Family Medicine

## 2020-09-06 DIAGNOSIS — Z1231 Encounter for screening mammogram for malignant neoplasm of breast: Secondary | ICD-10-CM

## 2020-09-07 ENCOUNTER — Other Ambulatory Visit: Payer: Self-pay | Admitting: Family Medicine

## 2020-10-29 ENCOUNTER — Ambulatory Visit
Admission: RE | Admit: 2020-10-29 | Discharge: 2020-10-29 | Disposition: A | Discharge status: Home / Self Care | Payer: Medicare Other | Source: Ambulatory Visit | Attending: Family Medicine | Admitting: Family Medicine

## 2020-10-29 ENCOUNTER — Other Ambulatory Visit: Payer: Self-pay

## 2020-10-29 DIAGNOSIS — Z1231 Encounter for screening mammogram for malignant neoplasm of breast: Secondary | ICD-10-CM

## 2020-11-24 ENCOUNTER — Other Ambulatory Visit: Payer: Self-pay | Admitting: *Deleted

## 2020-11-24 NOTE — Telephone Encounter (Signed)
Received fax requesting refill on  Ambien to mail order.   Ok to refill??  Last office visit 07/23/2020.  Last refill 09/07/2020.

## 2020-11-25 MED ORDER — ZOLPIDEM TARTRATE ER 12.5 MG PO TBCR: 12.5000 mg | EXTENDED_RELEASE_TABLET | Freq: Every day | ORAL | 0 refills | Status: DC

## 2021-01-17 ENCOUNTER — Other Ambulatory Visit: Payer: Self-pay | Admitting: Family Medicine

## 2021-01-18 ENCOUNTER — Other Ambulatory Visit: Payer: Self-pay

## 2021-02-22 ENCOUNTER — Other Ambulatory Visit: Payer: Self-pay | Admitting: Family Medicine

## 2021-02-22 DIAGNOSIS — G4700 Insomnia, unspecified: Secondary | ICD-10-CM

## 2021-03-02 NOTE — Telephone Encounter (Signed)
Pt called to check refill status. Per chart refill was denied. Pt states her mother recently passed away and she is in dire need of this medication. Of note, pt is scheduled for OV 03/07/20.  LOV 05/18/20 Last refill 11/25/20, #90, 0 refills  Please advise, thanks!

## 2021-03-02 NOTE — Addendum Note (Signed)
Addended by: Shelby Dubin on: 03/02/2021 10:22 AM   Modules accepted: Orders

## 2021-03-03 MED ORDER — ZOLPIDEM TARTRATE ER 12.5 MG PO TBCR: 12.5000 mg | EXTENDED_RELEASE_TABLET | Freq: Every day | ORAL | 0 refills | Status: DC

## 2021-03-07 ENCOUNTER — Encounter: Payer: Self-pay | Admitting: Family Medicine

## 2021-03-07 ENCOUNTER — Ambulatory Visit: Payer: Medicare Other | Admitting: Family Medicine

## 2021-03-07 ENCOUNTER — Other Ambulatory Visit: Payer: Self-pay

## 2021-03-07 VITALS — BP 118/62 | HR 68 | Temp 97.8°F | Resp 18 | Ht 64.0 in | Wt 154.0 lb

## 2021-03-07 DIAGNOSIS — M25511 Pain in right shoulder: Secondary | ICD-10-CM | POA: Diagnosis not present

## 2021-03-07 DIAGNOSIS — M25561 Pain in right knee: Secondary | ICD-10-CM

## 2021-03-07 DIAGNOSIS — G8929 Other chronic pain: Secondary | ICD-10-CM | POA: Diagnosis not present

## 2021-03-07 DIAGNOSIS — I10 Essential (primary) hypertension: Secondary | ICD-10-CM

## 2021-03-07 DIAGNOSIS — G4700 Insomnia, unspecified: Secondary | ICD-10-CM

## 2021-03-07 LAB — CBC WITH DIFFERENTIAL/PLATELET
Absolute Monocytes: 1426 cells/uL — ABNORMAL HIGH (ref 200–950)
Basophils Absolute: 26 cells/uL (ref 0–200)
Basophils Relative: 0.2 %
Eosinophils Absolute: 66 cells/uL (ref 15–500)
Eosinophils Relative: 0.5 %
HCT: 36.6 % (ref 35.0–45.0)
Hemoglobin: 11.9 g/dL (ref 11.7–15.5)
Lymphs Abs: 2165 cells/uL (ref 850–3900)
MCH: 28.1 pg (ref 27.0–33.0)
MCHC: 32.5 g/dL (ref 32.0–36.0)
MCV: 86.5 fL (ref 80.0–100.0)
MPV: 10.8 fL (ref 7.5–12.5)
Monocytes Relative: 10.8 %
Neutro Abs: 9517 cells/uL — ABNORMAL HIGH (ref 1500–7800)
Neutrophils Relative %: 72.1 %
Platelets: 283 10*3/uL (ref 140–400)
RBC: 4.23 10*6/uL (ref 3.80–5.10)
RDW: 13.5 % (ref 11.0–15.0)
Total Lymphocyte: 16.4 %
WBC: 13.2 10*3/uL — ABNORMAL HIGH (ref 3.8–10.8)

## 2021-03-07 LAB — LIPID PANEL
Cholesterol: 217 mg/dL — ABNORMAL HIGH (ref <200)
HDL: 66 mg/dL (ref >=50)
LDL Cholesterol (Calc): 115 mg/dL (calc) — ABNORMAL HIGH
Non-HDL Cholesterol (Calc): 151 mg/dL (calc) — ABNORMAL HIGH (ref <130)
Total CHOL/HDL Ratio: 3.3 (calc) (ref <5.0)
Triglycerides: 240 mg/dL — ABNORMAL HIGH (ref <150)

## 2021-03-07 LAB — COMPLETE METABOLIC PANEL WITH GFR
AG Ratio: 1.9 (calc) (ref 1.0–2.5)
ALT: 16 U/L (ref 6–29)
AST: 9 U/L — ABNORMAL LOW (ref 10–35)
Albumin: 4.2 g/dL (ref 3.6–5.1)
Alkaline phosphatase (APISO): 84 U/L (ref 37–153)
BUN/Creatinine Ratio: 30 (calc) — ABNORMAL HIGH (ref 6–22)
BUN: 29 mg/dL — ABNORMAL HIGH (ref 7–25)
CO2: 28 mmol/L (ref 20–32)
Calcium: 9.3 mg/dL (ref 8.6–10.4)
Chloride: 105 mmol/L (ref 98–110)
Creat: 0.96 mg/dL (ref 0.60–1.00)
Globulin: 2.2 g/dL (calc) (ref 1.9–3.7)
Glucose, Bld: 105 mg/dL — ABNORMAL HIGH (ref 65–99)
Potassium: 4.5 mmol/L (ref 3.5–5.3)
Sodium: 137 mmol/L (ref 135–146)
Total Bilirubin: 0.5 mg/dL (ref 0.2–1.2)
Total Protein: 6.4 g/dL (ref 6.1–8.1)
eGFR: 61 mL/min/{1.73_m2} (ref >=60)

## 2021-03-07 NOTE — Progress Notes (Signed)
Subjective:    Patient ID: Audrey David, female    DOB: 1945/10/29, 76 y.o.   MRN: [id number]  HPI  Patient presents today with 2 concerns.  First she has chronic pain in her right shoulder.  She went and saw an orthopedist in another state while she was traveling to visit family.  They did an x-ray at that time showing arthritis.  She had a cortisone injection in the shoulder.  This was in April.  The cortisone injection in April helped some.  However she has residual pain in the shoulder particular with abduction greater than 90 degrees.  She was tolerating a painful.  On therapy, she fell down some steps and reached out with her right hand trying to brace her fall.  She suffered a jerking injury to the right shoulder as she was falling grabbing onto the wall.  Ever since that time she has been unable to lift her arm above 90 degrees.  It hurts to lay on her shoulder at night.  It hurts with internal and external rotation.  She is not able to raise her arm above her head but also.  She [place] the same orthopedist [place]of[place]state while visiting family at the end of December.  She was up [place] for a funeral.  Orthopedics performed cortisone injection in her shoulder which is helped the pain although she still has it.  They recommended an MRI to evaluate for possible tear of the rotator cuff.  Today she has pain with abduction greater than 90 degrees.  She has pain with Hawkins maneuver.  She has pain with empty can testing.  However she does have good strength with empty can testing.  Drop testing elicits pain.  Internal and external rotation elicits pain.  Second problem is chronic pain in her right knee presumed to be due to.  She has had 2 separate cortisone injections of the right knee.  One was performed in April 2022.  This echo was performed at the same orthopedist.  December 2022.  Pain is located around the patella and under the patella.  Hurts to stand and walk.  It hurts to bear weight.  The pain  has been slightly better since she received a cortisone injection. 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     Ascorbic Acid (VITAMIN C PO) Take 1 tablet by mouth daily.     Cholecalciferol (VITAMIN D3) 10 MCG (400 UNIT) CAPS TAKE 2 CAPSULES DAILY 180 capsule 3   losartan (COZAAR) 100 MG tablet TAKE ONE-HALF (1/2) TABLET DAILY 45 tablet 3   tiZANidine (ZANAFLEX) 4 MG tablet TAKE 1 TABLET TWICE A DAY 180 tablet 3   Turmeric 500 MG TABS Take 500 mg by mouth 2 (two) times daily.     VITAMIN E PO  Take 1 tablet by mouth daily.     zolpidem (AMBIEN CR) 12.5 MG CR tablet Take 1 tablet (12.5 mg total) by mouth at bedtime. 90 tablet 0   ALPRAZolam (XANAX) 0.5 MG tablet Take 1 tablet (0.5 mg total) by mouth 3 (three) times daily as needed for anxiety. (Patient not taking: Reported on 03/07/2021) 30 tablet 0   No current facility-administered medications on file prior to visit.   Allergies  Allergen Reactions   Ace Inhibitors Cough    Other reaction(s): Cough (ALLERGY/intolerance)   Codeine Nausea Only    60 years ago   Social History   Socioeconomic History   Marital status: Single    Spouse name: Not on file   Number of children: Not on file   Years of education: Not on file   Highest education level: Not on file  Occupational History   Not on file  Tobacco Use   Smoking status: Former   Smokeless tobacco: Never   Tobacco  comments:     " quit smoking cigarettes in 2007 "  Substance and Sexual Activity   Alcohol use: Yes    Comment: Social    Drug use: No   Sexual activity: Not on file  Other Topics Concern   Not on file  Social History Narrative   Daily caffeine    Social Determinants of Health   Financial Resource Strain: Not on file  Food Insecurity: Not on file  Transportation Needs: Not on file  Physical Activity: Not on file  Stress: Not on file  Social Connections: Not on file  Intimate Partner Violence: Not on file     Review of Systems  All other systems reviewed and are negative.     Objective:   Physical Exam Vitals reviewed.  Constitutional:      Appearance: Normal appearance.  Cardiovascular:     Rate and Rhythm: Normal rate and regular rhythm.     Pulses: Normal pulses.     Heart sounds: Normal heart sounds.  Pulmonary:     Effort: Pulmonary effort is normal.     Breath sounds: Normal breath sounds.  Musculoskeletal:     Right shoulder: Tenderness and bony tenderness present. No swelling, deformity, effusion or crepitus. Decreased range of motion. Decreased strength.     Right knee: No effusion. Decreased range of motion. Tenderness present over the medial joint line and lateral joint line.  Neurological:     Mental Status: She is alert.          Assessment & Plan:  Chronic pain of right knee - Plan: DG Knee Complete 4 Views Right  Benign essential HTN - Plan: CBC with Differential/Platelet, COMPLETE METABOLIC PANEL WITH GFR, Lipid panel  Chronic right shoulder pain - Plan: MR Shoulder Right Wo Contrast Patient's blood pressure today is outstanding at 118/53.  While she is here today, I will check a CBC, CMP, and lipid panel.  I believe the pain in her right knee is due to osteoarthritis.  I will follow pain and x-ray of the knee to evaluate the severity of arthritis.  At the present time, if the patient can tolerate the pain with cortisone injections every 6  months, I would not referral for knee replacement if the x-ray supports moderate osteoarthritis.  If the x-ray is completely normal, I would recommend proceeding with an MRI for further evaluation however her exam today does not support a meniscal tear or ligament tear.  I believe the pain in the  right shoulder is secondary to partial tear of the supraspinatus.  She has not received relief from the cortisone injection.  The pain has been present now for 10 months.  It worsened after a recent fall.  She is tried and failed 2 separate cortisone injections along with the acetaminophen.  Proceed with an MRI of the right shoulder

## 2021-03-07 NOTE — Telephone Encounter (Signed)
Pt meant to ask you about getting #10 Ambien as her mail order won't arrive for about 1 week. Pt is aware she would have to pay out of pocket for these.   LOV 03/07/21 Last refill 03/03/21, #90, 0 refills Prior refill 11/25/20, #90, 0 refills  Please review, thanks!

## 2021-03-08 MED ORDER — ZOLPIDEM TARTRATE ER 12.5 MG PO TBCR: 12.5000 mg | EXTENDED_RELEASE_TABLET | Freq: Every day | ORAL | 0 refills | Status: DC

## 2021-03-22 ENCOUNTER — Other Ambulatory Visit: Payer: Medicare Other

## 2021-03-24 ENCOUNTER — Other Ambulatory Visit: Payer: Self-pay

## 2021-03-24 ENCOUNTER — Ambulatory Visit
Admission: RE | Admit: 2021-03-24 | Discharge: 2021-03-24 | Disposition: A | Discharge status: Home / Self Care | Payer: Medicare Other | Source: Ambulatory Visit | Attending: Family Medicine | Admitting: Family Medicine

## 2021-03-24 DIAGNOSIS — G8929 Other chronic pain: Secondary | ICD-10-CM

## 2021-03-24 DIAGNOSIS — M25511 Pain in right shoulder: Secondary | ICD-10-CM

## 2021-03-31 ENCOUNTER — Other Ambulatory Visit: Payer: Self-pay

## 2021-03-31 DIAGNOSIS — M25511 Pain in right shoulder: Secondary | ICD-10-CM

## 2021-05-05 ENCOUNTER — Ambulatory Visit: Payer: Medicare Other

## 2021-05-19 ENCOUNTER — Ambulatory Visit: Payer: Medicare Other

## 2021-06-05 ENCOUNTER — Other Ambulatory Visit: Payer: Self-pay | Admitting: Family Medicine

## 2021-06-05 DIAGNOSIS — G4700 Insomnia, unspecified: Secondary | ICD-10-CM

## 2021-06-06 ENCOUNTER — Telehealth: Payer: Self-pay

## 2021-06-06 NOTE — Telephone Encounter (Signed)
Please see refill encounter 06/05/21 ?

## 2021-06-06 NOTE — Telephone Encounter (Signed)
LOV 03/07/21 ?Last refill 03/08/21, #10, 0 refills ? ?Please review, thanks! ? ?

## 2021-07-11 ENCOUNTER — Telehealth: Payer: Self-pay

## 2021-07-11 ENCOUNTER — Ambulatory Visit: Payer: Medicare Other | Admitting: Family Medicine

## 2021-07-11 VITALS — BP 120/80 | HR 94 | Temp 98.0°F | Ht 64.0 in | Wt 157.4 lb

## 2021-07-11 DIAGNOSIS — L237 Allergic contact dermatitis due to plants, except food: Secondary | ICD-10-CM | POA: Diagnosis not present

## 2021-07-11 MED ORDER — PREDNISONE 20 MG PO TABS: ORAL_TABLET | ORAL | 0 refills | Status: DC

## 2021-07-11 NOTE — Telephone Encounter (Signed)
Pt woke up this morning covered in poison ivy on her face, left leg. Would like a Rx for some prednisone.

## 2021-07-11 NOTE — Progress Notes (Signed)
Subjective:    Patient ID: Audrey David, female    DOB: 1945-07-31, 76 y.o.   MRN: [id number]  HPI Patient has an itchy rash that developed over the weekend.  She has 3 vesicles on her medial upper left thigh.  They have an erythematous base.  1 has ruptured.  She also has vesicular papules around her right and in her upper right eyelid.  She also has a similar rash on her forearms and on her hands.  She believes that she came in contact with poison ivy or poison oak regularly.  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 (Patient not taking: Reported on 03/07/2021) 30 tablet 0   APPLE CIDER VINEGAR PO Take 1 capsule by mouth daily.     Ascorbic Acid (VITAMIN C PO) Take 1 tablet by mouth daily.     Cholecalciferol (VITAMIN D3) 10 MCG (400 UNIT) CAPS TAKE 2 CAPSULES DAILY 180 capsule 3   losartan (COZAAR) 100 MG tablet TAKE ONE-HALF (1/2) TABLET DAILY 45 tablet 3   tiZANidine (ZANAFLEX) 4 MG tablet TAKE 1 TABLET TWICE A DAY 180 tablet 3   Turmeric 500 MG TABS Take 500 mg by mouth 2 (two) times daily.     VITAMIN E PO Take 1 tablet by mouth daily.     zolpidem (AMBIEN CR) 12.5 MG CR tablet TAKE 1 TABLET AT BEDTIME 90 tablet 0   No current facility-administered medications on file prior to visit.   Allergies  Allergen Reactions   Ace Inhibitors Cough    Other  reaction(s): Cough (ALLERGY/intolerance)   Codeine Nausea Only    60 years ago   Social History   Socioeconomic History   Marital status: Single    Spouse name: Not on file   Number of children: Not on file   Years of education: Not on file   Highest education level: Not on file  Occupational History   Not on file  Tobacco Use   Smoking status: Former   Smokeless tobacco: Never   Tobacco comments:     " quit smoking cigarettes in 2007 "  Substance and Sexual Activity   Alcohol use: Yes    Comment: Social    Drug use: No   Sexual activity: Not on file  Other Topics Concern   Not on file  Social History Narrative   Daily caffeine    Social Determinants of Health   Financial Resource Strain: Not on file  Food Insecurity: Not on file  Transportation Needs: Not on file  Physical Activity: Not on file  Stress: Not on file  Social Connections: Not on file  Intimate Partner Violence: Not on file     Review of Systems  All other  systems reviewed and are negative.     Objective:   Physical Exam Vitals reviewed.  Constitutional:      General: She is not in acute distress.    Appearance: She is well-developed. She is not ill-appearing or toxic-appearing.  HENT:     Head: Normocephalic and atraumatic.     Mouth/Throat:     Pharynx: No oropharyngeal exudate or posterior oropharyngeal erythema.  Cardiovascular:     Rate and Rhythm: Normal rate and regular rhythm.     Heart sounds: Normal heart sounds. No murmur heard. Pulmonary:     Effort: Pulmonary effort is normal. No respiratory distress.     Breath sounds: Normal breath sounds. No wheezing or rhonchi.  Skin:    Findings: Rash present.  Neurological:     Mental Status: She is alert. She is not disoriented.       Assessment & Plan:  Allergic contact dermatitis due to plant Patient was given the choice between prednisone taper pack and topical cortisone cream.  Despite the risk of oral steroid she elects to do  the prednisone taper pack because of the itching.

## 2021-07-15 ENCOUNTER — Other Ambulatory Visit: Payer: Self-pay | Admitting: Family Medicine

## 2021-07-19 ENCOUNTER — Other Ambulatory Visit: Payer: Self-pay | Admitting: Family Medicine

## 2021-07-19 ENCOUNTER — Telehealth: Payer: Self-pay

## 2021-07-19 MED ORDER — PREDNISONE 20 MG PO TABS
ORAL_TABLET | ORAL | 0 refills | Status: DC
Start: 2021-07-19 — End: 2022-03-07

## 2021-07-19 MED ORDER — HYDROCODONE BIT-HOMATROP MBR 5-1.5 MG/5ML PO SOLN: 5.0000 mL | Freq: Three times a day (TID) | ORAL | 0 refills | Status: DC | PRN

## 2021-07-19 NOTE — Telephone Encounter (Signed)
Requested Prescriptions  Pending Prescriptions Disp Refills  . losartan (COZAAR) 100 MG tablet [Pharmacy Med Name: LOSARTAN TABS 100MG ] 45 tablet 1    Sig: TAKE ONE-HALF (1/2) TABLET DAILY     Cardiovascular:  Angiotensin Receptor Blockers Passed - 07/15/2021 12:17 AM      Passed - Cr in normal range and within 180 days    Creat  Date Value Ref Range Status  03/07/2021 0.96 0.60 - 1.00 mg/dL Final         Passed - K in normal range and within 180 days    Potassium  Date Value Ref Range Status  03/07/2021 4.5 3.5 - 5.3 mmol/L Final         Passed - Patient is not pregnant      Passed - Last BP in normal range    BP Readings from Last 1 Encounters:  07/11/21 120/80         Passed - Valid encounter within last 6 months    Recent Outpatient Visits          1 week ago Allergic contact dermatitis due to plant   [facility] Pick[place], [facility], MD   4 months ago Chronic pain of right knee   [facility] [place], [facility], MD   1 year ago General medical exam   [facility], MD   1 year ago Allergic contact dermatitis due to plant   [facility], FNP   1 year ago Depression, major, single episode, moderate (HCC)   [facility] Pickard, [facility], MD

## 2021-07-19 NOTE — Telephone Encounter (Signed)
Pt called in stating that the rash that she was seen for has not gotten any better. Pt also states that she has now developed a very bad cough and congestion, loss of taste, and sore throat. Pt stated that she will take an at home Covid test asap. Pt would like to know if she could get a med for the cough sent in to pharmacy, and also some more predniSONE (DELTASONE) 20 MG for the rash. Please advise.  Cb#: [phone]

## 2021-07-20 NOTE — Telephone Encounter (Signed)
Attempted to call pt, however, no answer. LM regarding Rx sent. Advice pt should she have any questions/concerns to contact our office.

## 2021-08-08 ENCOUNTER — Ambulatory Visit: Payer: Self-pay | Admitting: *Deleted

## 2021-08-08 NOTE — Telephone Encounter (Signed)
Please call pt to make an OV to get her cortisone inject. Per Dr

## 2021-08-08 NOTE — Telephone Encounter (Signed)
  Chief Complaint: eyelid swelling, left foot and ankle swelling, left hand swelling  Symptoms: eyelids swollen and not completely shut. Itching, no pain. Left foot and ankle swelling with left hand swelling , no taste. Took covid test and negative. C/o eyelid swelling worsening x a few weeks. Requesting a cortisone shot in knee prior to going on trip to [place] this week Frequency: few weeks now worse Pertinent Negatives: Patient denies chest pain, no difficult breathing, no fever no pain  Disposition: [] ED /[x] Urgent Care (no appt availability in office) / [] Appointment(In office/virtual)/ []  [facility]/ [] Home Care/ [] Refused Recommended Disposition /[] [place] [facility]/ []  Follow-up with PCP Additional Notes:   Benadryl not effective. Requesting appt for cortisone injection prior to going on trip this week. Requesting to come in anytime tomorrow or Thursday. Requesting a call patient back.    Reason for Disposition  [1] SEVERE eyelid swelling (i.e., shut or almost) AND [2] involves both eyes AND [3] itchy  Answer Assessment - Initial Assessment Questions 1. ONSET: "When did the swelling start?" (e.g., minutes, hours, days)     Started a few weeks ago and worsening now  2. LOCATION: "What part of the eyelids is swollen?"     Upper inner corners worse  3. SEVERITY: "How swollen is it?"     Very swollen  4. ITCHING: "Is there any itching?" If Yes, ask: "How much?"   (Scale 1-10; mild, moderate or severe)     severe 5. PAIN: "Is the swelling painful to touch?" If Yes, ask: "How painful is it?"   (Scale 1-10; mild, moderate or severe)     no 6. FEVER: "Do you have a fever?" If Yes, ask: "What is it, how was it measured, and when did it start?"      No  7. CAUSE: "What do you think is causing the swelling?"     Not sure  8. RECURRENT SYMPTOM: "Have you had eyelid swelling before?" If Yes, ask: "When was the last time?" "What happened that time?"     Yes due to allergies  outside but this has gotten worse  9. OTHER SYMPTOMS: "Do you have any other symptoms?" (e.g., blurred vision, eye discharge, rash, runny nose)     Left hand and left foot and ankle swelling  10. PREGNANCY: "Is there any chance you are pregnant?" "When was your last menstrual period?"       na  Protocols used: Eye - Swelling-A-AH

## 2021-08-09 ENCOUNTER — Encounter (HOSPITAL_COMMUNITY): Payer: Self-pay

## 2021-08-09 ENCOUNTER — Telehealth: Payer: Self-pay | Admitting: Family Medicine

## 2021-08-09 ENCOUNTER — Ambulatory Visit (HOSPITAL_COMMUNITY)
Admission: EM | Admit: 2021-08-09 | Discharge: 2021-08-09 | Disposition: A | Discharge status: Home / Self Care | Payer: Medicare Other | Attending: Physician Assistant | Admitting: Physician Assistant

## 2021-08-09 DIAGNOSIS — L299 Pruritus, unspecified: Secondary | ICD-10-CM | POA: Diagnosis not present

## 2021-08-09 DIAGNOSIS — L239 Allergic contact dermatitis, unspecified cause: Secondary | ICD-10-CM | POA: Diagnosis not present

## 2021-08-09 DIAGNOSIS — H5789 Other specified disorders of eye and adnexa: Secondary | ICD-10-CM | POA: Diagnosis not present

## 2021-08-09 MED ORDER — TRIAMCINOLONE ACETONIDE 40 MG/ML IJ SUSP
40.0000 mg | Freq: Once | INTRAMUSCULAR | Status: AC
  Administered 2021-08-09: 40 mg via INTRAMUSCULAR

## 2021-08-09 MED ORDER — LORATADINE 10 MG PO TABS: 10.0000 mg | ORAL_TABLET | Freq: Every day | ORAL | 0 refills | Status: AC

## 2021-08-09 MED ORDER — NAPHAZOLINE-PHENIRAMINE 0.025-0.3 % OP SOLN: 1.0000 [drp] | Freq: Four times a day (QID) | OPHTHALMIC | 0 refills | Status: AC | PRN

## 2021-08-09 MED ORDER — TRIAMCINOLONE ACETONIDE 40 MG/ML IJ SUSP
INTRAMUSCULAR | Status: AC
  Filled 2021-08-09: qty 1

## 2021-08-09 NOTE — Telephone Encounter (Signed)
Left message for patient to call back and schedule Medicare Annual Wellness Visit (AWV) in office.  ? ?If not able to come in office, please offer to do virtually or by telephone.  Left office number and my jabber #[phone]. ? ?Last AWV:: 05/18/2020 ? ?Please schedule at anytime with Nurse Health Advisor. ?  ?

## 2021-08-09 NOTE — ED Triage Notes (Signed)
Pt c/o eyes and facial itching/swelling x2wks. States 5/31 seen and tx from PCP for poison ivy and allergies with antibiotic and prednisone.

## 2021-08-09 NOTE — Discharge Instructions (Addendum)
Advise cool compresses to the eyes, 5 to 10 minutes on, 20 minutes off, 3-4 times a day to help reduce swelling. Advised to take the Claritin 10 mg 1 tablet daily to help reduce the itching and swelling. Advised to use the Naphcon-A eyedrops, 1 drop in each eye every 6 hours to help reduce the itching and irritation. Follow-up with PCP or return to urgent care symptoms fail to improve.

## 2021-08-09 NOTE — ED Provider Notes (Signed)
[place]    CSN: [id number] Arrival date & time: 08/09/21  1341      History   Chief Complaint Chief Complaint  Patient presents with   Facial Swelling    HPI Audrey David is a 76 y.o. female.   76 year old female presents with itching and irritation.  Patient relates for the past 2 weeks she has been having persistent bilateral eyelid itching, redness, mild swelling.  He relates that her eyes itch, and she has noticed that the eyelids have been swelling intermittently.  Patient relates that there is redness associated on the inner aspect of the eyes.  Patient indicates that she has also noticed and feels like that she has swelling of the hands and also of the lower legs.  Patient relates that she did take and complete the prescription of the antibiotic and the prednisone taper that was given by her PCP on 5/31.  Patient relates that she is not having any fever, chills, upper respiratory symptoms, or shortness of breath.  Patient relates that she has not eaten any new foods, but she has been working outside in the garden over the past couple of weeks.  Patient relates she does get poison ivy poison oak quite frequently.     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      Home Medications    Prior to Admission medications   Medication Sig Start Date End Date Taking? Authorizing Provider  loratadine (CLARITIN) 10 MG tablet Take 1 tablet (10 mg total) by mouth daily. For itching and swelling. 08/09/21  Yes Nyoka Lint, PA-C  naphazoline-pheniramine (NAPHCON-A) 0.025-0.3 % ophthalmic solution Place 1 drop into both eyes 4 (four) times daily as needed for eye irritation. 08/09/21  Yes Nyoka Lint, PA-C  ALPRAZolam Duanne Moron) 0.5 MG tablet Take 1 tablet (0.5 mg total) by mouth 3 (three) times daily as needed for anxiety. 01/30/19   Susy Frizzle, MD  APPLE CIDER VINEGAR PO Take 1 capsule by mouth daily.    [provider]  Ascorbic Acid (VITAMIN C PO) Take 1 tablet by mouth daily.    [provider]  Cholecalciferol (VITAMIN D3) 10 MCG (400 UNIT) CAPS TAKE 2 CAPSULES DAILY 02/24/19   Susy Frizzle, MD  HYDROcodone bit-homatropine (HYCODAN) 5-1.5 MG/5ML syrup Take 5 mLs by mouth every 8 (eight) hours as needed for cough. 07/19/21   Susy Frizzle, MD  losartan (COZAAR) 100 MG tablet TAKE  ONE-HALF (1/2) TABLET DAILY 07/19/21   Susy Frizzle, MD  predniSONE (DELTASONE) 20 MG tablet 3 tabs poqday 1-2, 2 tabs poqday 3-4, 1 tab poqday 5-6 07/19/21   Susy Frizzle, MD  tiZANidine (ZANAFLEX) 4 MG tablet TAKE 1 TABLET TWICE A DAY 01/18/21   Susy Frizzle, MD  Turmeric 500 MG TABS Take 500 mg by mouth 2 (two) times daily.    [provider]  VITAMIN E PO Take 1 tablet by mouth daily.    [provider]  zolpidem (AMBIEN CR) 12.5 MG CR tablet TAKE 1 TABLET AT BEDTIME 06/06/21   Susy Frizzle, MD    Family History Family History  Problem Relation Age of Onset   Hypertension Mother    Cancer Father 62       multiple myeloma. Throat Cancer.   Breast cancer Niece 90        left BrCA - opted for bilat mastectomy    Social History Social History   Tobacco Use   Smoking status: Former   Smokeless tobacco: Never   Tobacco comments:     " quit smoking cigarettes in 2007 "  Substance Use Topics   Alcohol use: Yes    Comment: Social    Drug use: No     Allergies   Ace inhibitors and Codeine   Review of Systems Review of Systems  Eyes:  Positive for itching (both eyes).     Physical Exam Triage Vital Signs ED Triage Vitals  Enc Vitals Group     BP 08/09/21 1452 (!) 146/81     Pulse Rate 08/09/21 1452 76     Resp 08/09/21 1452 18     Temp 08/09/21 1452 97.6 F (36.4 C)     Temp Source 08/09/21 1452 Oral     SpO2 08/09/21 1452 96 %     Weight --      Height --      Head Circumference --      Peak Flow --      Pain Score 08/09/21 1453 0     Pain Loc --      Pain Edu? --      Excl. in [place]? --    No data found.  Updated Vital Signs BP (!) 146/81 (BP Location: Right Arm)   Pulse 76   Temp 97.6 F (36.4 C) (Oral)   Resp 18   SpO2 96%   Visual Acuity Right Eye Distance:   Left Eye Distance:   Bilateral Distance:    Right Eye Near:   Left Eye Near:    Bilateral Near:     Physical Exam Constitutional:      Appearance: Normal appearance.  HENT:     Mouth/Throat:     Mouth: Mucous membranes are moist.     Pharynx: Oropharynx is clear. No posterior oropharyngeal erythema.  Eyes:     Extraocular Movements: Extraocular movements intact.     Conjunctiva/sclera: Conjunctivae normal.     Comments: Eyes: Upper and lower lid bilaterally with mild swelling, there is redness and skin irritation of the inner aspects of both eyes.  The conjunctiva are normal, there is no drainage noted.  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  Neurological:     Mental Status: She is alert.      UC Treatments / Results  Labs (all labs ordered are listed, but  only abnormal results are displayed) Labs Reviewed - No data to display  EKG   Radiology No results found.  Procedures Procedures (including critical care time)  Medications Ordered in UC Medications  triamcinolone acetonide (KENALOG-40) injection 40 mg (has no administration in time range)    Initial Impression / Assessment and Plan / UC Course  I have reviewed the triage vital signs and the nursing notes.  Pertinent labs & imaging results that were available during my care of the patient were reviewed by me and considered in my medical decision making (see chart for details).    Plan: 1.  Advised to use cool compresses frequently to reduce the irritation and swelling. 2.  Advised the patient to use the Naphcon-A eyedrops 1 drop each eye every 6 hours for the next couple days to reduce the itching and irritation. 3.  Advised patient take Claritin 10 mg 1 tablet daily to help reduce the itching and irritation. 4.  Advised to follow-up with PCP or return to urgent care if symptoms fail to improve. Final Clinical Impressions(s) / UC Diagnoses   Final diagnoses:  Irritation of both eyes  Itching  Allergic contact dermatitis, unspecified trigger     Discharge Instructions      Advise cool compresses to the eyes, 5 to 10 minutes on, 20 minutes off, 3-4 times a day to help reduce swelling. Advised to take the Claritin 10 mg 1 tablet daily to help reduce the itching and swelling. Advised to use the Naphcon-A eyedrops, 1 drop in each eye every 6 hours to help reduce the itching and irritation. Follow-up with PCP or return to urgent care symptoms fail to improve.    ED Prescriptions     Medication Sig Dispense Auth. Provider   naphazoline-pheniramine (NAPHCON-A) 0.025-0.3 % ophthalmic solution Place 1 drop into both eyes 4 (four) times daily as needed for eye irritation. 15 mL Nyoka Lint, PA-C   loratadine (CLARITIN) 10 MG tablet Take 1 tablet (10 mg total) by mouth daily.  For itching and swelling. 10 tablet Nyoka Lint, PA-C      PDMP not reviewed this encounter.   Nyoka Lint, PA-C 08/09/21 1545

## 2021-08-26 ENCOUNTER — Other Ambulatory Visit: Payer: Self-pay | Admitting: Family Medicine

## 2021-08-26 DIAGNOSIS — G4700 Insomnia, unspecified: Secondary | ICD-10-CM

## 2021-08-26 NOTE — Telephone Encounter (Signed)
Requested medication (s) are due for refill today: yes  Requested medication (s) are on the active medication list: yes  Last refill:  06/06/21 #90/0  Future visit scheduled: no  Notes to clinic:  Unable to refill per protocol, cannot delegate.    Requested Prescriptions  Pending Prescriptions Disp Refills   zolpidem (AMBIEN CR) 12.5 MG CR tablet 90 tablet 0    Sig: Take 1 tablet (12.5 mg total) by mouth at bedtime.     Not Delegated - Psychiatry:  Anxiolytics/Hypnotics Failed - 08/26/2021 12:52 PM      Failed - This refill cannot be delegated      Failed - Urine Drug Screen completed in last 360 days      Passed - Valid encounter within last 6 months    Recent Outpatient Visits           1 month ago Allergic contact dermatitis due to plant   [facility] Pickard, Priscille Heidelberg, MD   5 months ago Chronic pain of right knee   [facility] Pickard, Priscille Heidelberg, MD   1 year ago General medical exam   [facility] Donita Brooks, MD   1 year ago Allergic contact dermatitis due to plant   [facility] Elmore Guise, FNP   1 year ago Depression, major, single episode, moderate (HCC)   [facility] Pickard, Priscille Heidelberg, MD

## 2021-08-26 NOTE — Telephone Encounter (Signed)
Received a fax from [facility] requesting refill on zolpidem (AMBIEN CR) 12.5 MG CR tablet [[id number]]    Order Details Dose, Route, Frequency: As Directed  Dispense Quantity: 90 tablet Refills: 0        Sig: TAKE 1 TABLET AT BEDTIME       Start Date: 06/06/21 End Date: --  Written Date: 06/06/21 Expiration Date: 12/03/21     Associated Diagnoses: Insomnia, unspecified type [G47.00]  Original Order:  zolpidem (AMBIEN CR) 12.5 MG CR tablet [[id number]]  Providers  Authorizing Provider:   Donita Brooks, MD  [address], [place] [facility] [place] [address]  Phone:  [phone]   Fax:  [phone]  DEA #:  [id number]   NPI:  [id number]      Ordering User:  Donita Brooks, MD       Pharmacy  [facility] - [place], [place] - [address]  [address] [place] [address]  Phone:  [phone]  Fax:  [phone]

## 2021-08-29 ENCOUNTER — Other Ambulatory Visit: Payer: Self-pay

## 2021-08-29 DIAGNOSIS — G4700 Insomnia, unspecified: Secondary | ICD-10-CM

## 2021-08-29 MED ORDER — ZOLPIDEM TARTRATE ER 12.5 MG PO TBCR: 12.5000 mg | EXTENDED_RELEASE_TABLET | Freq: Every day | ORAL | 0 refills | Status: DC

## 2021-08-29 NOTE — Telephone Encounter (Signed)
LOV 07/11/21 Last refill 06/06/21, #90, 0 refills  Please review, thanks!

## 2021-09-26 ENCOUNTER — Other Ambulatory Visit: Payer: Self-pay | Admitting: Family Medicine

## 2021-09-26 DIAGNOSIS — Z1231 Encounter for screening mammogram for malignant neoplasm of breast: Secondary | ICD-10-CM

## 2021-10-31 ENCOUNTER — Ambulatory Visit
Admission: RE | Admit: 2021-10-31 | Discharge: 2021-10-31 | Disposition: A | Discharge status: Home / Self Care | Payer: Medicare Other | Source: Ambulatory Visit | Attending: Family Medicine | Admitting: Family Medicine

## 2021-10-31 ENCOUNTER — Ambulatory Visit: Payer: Medicare Other

## 2021-10-31 DIAGNOSIS — Z1231 Encounter for screening mammogram for malignant neoplasm of breast: Secondary | ICD-10-CM

## 2021-11-21 ENCOUNTER — Other Ambulatory Visit: Payer: Self-pay | Admitting: Family Medicine

## 2021-11-21 DIAGNOSIS — G4700 Insomnia, unspecified: Secondary | ICD-10-CM

## 2022-01-11 ENCOUNTER — Other Ambulatory Visit: Payer: Self-pay | Admitting: Family Medicine

## 2022-01-11 NOTE — Telephone Encounter (Signed)
Requested medication (s) are due for refill today:   Yes  Requested medication (s) are on the active medication list:   Yes  Future visit scheduled:   No   Last ordered: 07/19/2021 #45, 1 refill  Returned because last physical was 03/07/2021.   Unable to refill per protocol because labs are due and does not have a visit within 6 months.   Unable to determine from his note if she is to be seen every 6 months or yearly.   Returned for his review.    Requested Prescriptions  Pending Prescriptions Disp Refills   losartan (COZAAR) 100 MG tablet [Pharmacy Med Name: LOSARTAN TABS 100MG ] 45 tablet 3    Sig: TAKE ONE-HALF (1/2) TABLET DAILY     Cardiovascular:  Angiotensin Receptor Blockers Failed - 01/11/2022 12:15 AM      Failed - Cr in normal range and within 180 days    Creat  Date Value Ref Range Status  03/07/2021 0.96 0.60 - 1.00 mg/dL Final         Failed - K in normal range and within 180 days    Potassium  Date Value Ref Range Status  03/07/2021 4.5 3.5 - 5.3 mmol/L Final         Failed - Last BP in normal range    BP Readings from Last 1 Encounters:  08/09/21 (!) 146/81         Failed - Valid encounter within last 6 months    Recent Outpatient Visits           6 months ago Allergic contact dermatitis due to plant   [facility], MD   10 months ago Chronic pain of right knee   [facility] Pickard, [facility], MD   1 year ago General medical exam   [facility], MD   2 years ago Allergic contact dermatitis due to plant   [facility] Medicine [facility] A, FNP   2 years ago Depression, major, single episode, moderate (HCC)   [facility] Pickard, [facility], MD              Passed - Patient is not pregnant

## 2022-01-17 ENCOUNTER — Other Ambulatory Visit: Payer: Self-pay | Admitting: Family Medicine

## 2022-01-25 ENCOUNTER — Telehealth: Payer: Self-pay | Admitting: Family Medicine

## 2022-01-25 NOTE — Telephone Encounter (Signed)
Left message for patient to call back and schedule Medicare Annual Wellness Visit (AWV) in office.   If not able to come in office, please offer to do virtually or by telephone.   Last AWV:05/18/2020   Please schedule at anytime with [facility] Toni Amend  If any questions, please contact me at [phone].  Thank you ,  Judeth Cornfield

## 2022-02-15 ENCOUNTER — Other Ambulatory Visit: Payer: Self-pay | Admitting: Family Medicine

## 2022-02-15 ENCOUNTER — Other Ambulatory Visit: Payer: Self-pay

## 2022-02-15 DIAGNOSIS — G4700 Insomnia, unspecified: Secondary | ICD-10-CM

## 2022-02-16 MED ORDER — ZOLPIDEM TARTRATE ER 12.5 MG PO TBCR: 12.5000 mg | EXTENDED_RELEASE_TABLET | Freq: Every day | ORAL | 0 refills | Status: DC

## 2022-02-22 ENCOUNTER — Telehealth: Payer: Self-pay | Admitting: Family Medicine

## 2022-02-22 NOTE — Telephone Encounter (Signed)
Patient exposed to [place] 02/21/2022 (received call person she was visiting with tested positive for COVID this morning). She's asthmatic and concerned. Patient wants more information about Paxlovid, incubation period, and next best steps.  Pharmacy confirmed as:  [id number] - [place], [place] [address] [place] [address] Phone: [phone]  Fax: [phone] DEA #: [id number]   Please advise at [phone].

## 2022-02-23 ENCOUNTER — Other Ambulatory Visit: Payer: Self-pay | Admitting: Family Medicine

## 2022-02-23 MED ORDER — NIRMATRELVIR/RITONAVIR (PAXLOVID)TABLET: 3.0000 | ORAL_TABLET | Freq: Two times a day (BID) | ORAL | 0 refills | Status: AC

## 2022-03-07 ENCOUNTER — Ambulatory Visit (INDEPENDENT_AMBULATORY_CARE_PROVIDER_SITE_OTHER): Payer: Medicare Other

## 2022-03-07 VITALS — BP 126/78 | Ht 64.0 in | Wt 150.8 lb

## 2022-03-07 DIAGNOSIS — Z0000 Encounter for general adult medical examination without abnormal findings: Secondary | ICD-10-CM | POA: Diagnosis not present

## 2022-03-07 NOTE — Progress Notes (Signed)
Subjective:   Audrey David is a 77 y.o. female who presents for Medicare Annual (Subsequent) preventive examination.  Review of Systems     Cardiac Risk Factors include: advanced age (>50men, >80 women);hypertension     Objective:    Today's Vitals   03/07/22 1405  BP: 126/78  Weight: 150 lb 12.8 oz (68.4 kg)  Height: 5\' 4"  (1.626 m)   Body mass index is 25.88 kg/m.     03/07/2022    2:15 PM 05/18/2020    3:14 PM 10/23/2018    4:14 AM 11/30/2014    2:20 PM  Advanced Directives  Does Patient Have a Medical Advance Directive? No No No No  Does patient want to make changes to medical advance directive? Yes (MAU/Ambulatory/Procedural Areas - Information given)     Would patient like information on creating a medical advance directive?  No - Patient declined Yes (ED - Information included in AVS) Yes - Educational materials given    Current Medications (verified) Outpatient Encounter Medications as of 03/07/2022  Medication Sig   ALPRAZolam (XANAX) 0.5 MG tablet Take 1 tablet (0.5 mg total) by mouth 3 (three) times daily as needed for anxiety.   APPLE CIDER VINEGAR PO Take 1 capsule by mouth daily.   Ascorbic Acid (VITAMIN C PO) Take 1 tablet by mouth daily.   Cholecalciferol (VITAMIN D3) 10 MCG (400 UNIT) CAPS TAKE 2 CAPSULES DAILY   loratadine (CLARITIN) 10 MG tablet Take 1 tablet (10 mg total) by mouth daily. For itching and swelling.   losartan (COZAAR) 100 MG tablet TAKE ONE-HALF (1/2) TABLET DAILY   naphazoline-pheniramine (NAPHCON-A) 0.025-0.3 % ophthalmic solution Place 1 drop into both eyes 4 (four) times daily as needed for eye irritation.   tiZANidine (ZANAFLEX) 4 MG tablet TAKE 1 TABLET TWICE A DAY   Turmeric 500 MG TABS Take 500 mg by mouth 2 (two) times daily.   VITAMIN E PO Take 1 tablet by mouth daily.   zolpidem (AMBIEN CR) 12.5 MG CR tablet Take 1 tablet (12.5 mg total) by mouth at bedtime.   [DISCONTINUED] HYDROcodone bit-homatropine (HYCODAN) 5-1.5  MG/5ML syrup Take 5 mLs by mouth every 8 (eight) hours as needed for cough.   [DISCONTINUED] predniSONE (DELTASONE) 20 MG tablet 3 tabs poqday 1-2, 2 tabs poqday 3-4, 1 tab poqday 5-6   No facility-administered encounter medications on file as of 03/07/2022.    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 Throat Cancer.   Breast cancer Niece 51       left [facility] - opted for bilat mastectomy   Social History   Socioeconomic History   Marital status: Single    Spouse name: Not on file   Number of children: Not  on file   Years of education: Not on file   Highest education level: Not on file  Occupational History   Not on file  Tobacco Use   Smoking status: Former   Smokeless tobacco: Never   Tobacco comments:     " quit smoking cigarettes in 2007 "  Substance and Sexual Activity   Alcohol use: Yes    Comment: Social    Drug use: No   Sexual activity: Not on file  Other Topics Concern   Not on file  Social History Narrative   Daily caffeine    Social Determinants of Health   Financial Resource Strain: Low Risk  (03/07/2022)   Overall Financial Resource Strain (CARDIA)    Difficulty of Paying Living Expenses: Not hard at all  Food Insecurity: No Food Insecurity (03/07/2022)   Hunger Vital Sign    Worried About Running Out of Food in the Last Year: Never true    Ran Out of Food in the  Last Year: Never true  Transportation Needs: No Transportation Needs (03/07/2022)   PRAPARE - Hydrologist (Medical): No    Lack of Transportation (Non-Medical): No  Physical Activity: Sufficiently Active (03/07/2022)   Exercise Vital Sign    Days of Exercise per Week: 7 days    Minutes of Exercise per Session: 30 min  Stress: No Stress Concern Present (03/07/2022)   [place]    Feeling of Stress : Not at all  Social Connections: Moderately Integrated (03/07/2022)   Social Connection and Isolation Panel [NHANES]    Frequency of Communication with Friends and Family: More than three times a week    Frequency of Social Gatherings with Friends and Family: Three times a week    Attends Religious Services: More than 4 times per year    Active Member of Clubs or Organizations: Yes    Attends Music therapist: More than 4 times per year    Marital Status: Never married    Tobacco Counseling Counseling given: Not Answered Tobacco comments:  " quit smoking cigarettes in 2007 "   Clinical Intake:  Pre-visit preparation completed: Yes  Pain : No/denies pain  Diabetes: No  How often do you need to have someone help you when you read instructions, pamphlets, or other written materials from your doctor or pharmacy?: 1 - Never  Diabetic?No   Interpreter Needed?: No  Information entered by :: Denman George LPN   Activities of Daily Living    03/07/2022    2:15 PM  In your present state of health, do you have any difficulty performing the following activities:  Hearing? 0  Vision? 0  Difficulty concentrating or making decisions? 0  Walking or climbing stairs? 0  Dressing or bathing? 0  Doing errands, shopping? 0  Preparing Food and eating ? N  Using the Toilet? N  In the past six months, have you accidently leaked urine? N  Do you have problems with loss of bowel control?  N  Managing your Medications? N  Managing your Finances? N  Housekeeping or managing your Housekeeping? N    Patient Care Team: Susy Frizzle, MD as PCP - General (Family [facility]) [place], [place] any recent Medical Services you may have received from other than [facility] providers in the past year (date may be approximate).     Assessment:   This is a routine wellness examination for [facility].  Hearing/Vision screen Hearing Screening - Comments:: Denies hearing difficulties  Vision Screening - Comments:: up to date with routine eye exams with Dr. Gershon Crane    Dietary issues and exercise activities discussed:     Goals Addressed             This Visit's Progress    DIET - INCREASE WATER INTAKE         Depression Screen    03/07/2022    2:28 PM 05/18/2020    3:13 PM 07/24/2017    9:22 AM 02/28/2016    8:40 AM 05/27/2015    2:13 PM 07/16/2013   10:16 AM  PHQ 2/9 Scores  PHQ - 2 Score 0 0 0 0 0 1  PHQ- 9 Score  0        Fall Risk    03/07/2022    2:15 PM 05/18/2020    3:13 PM 07/24/2017    9:22 AM 02/28/2016    8:40 AM 05/27/2015    2:13 PM  Fall Risk   Falls in the past year? 0 0 No No No  Number falls in past yr: 0      Injury with Fall? 0      Risk for fall due to : No Fall Risks No Fall Risks     Follow up Falls evaluation completed;Education provided;Falls prevention discussed Falls evaluation completed       FALL RISK PREVENTION PERTAINING TO THE HOME:  Any stairs in or around the home? Yes  If so, are there any without handrails? No  Home free of loose throw rugs in walkways, pet beds, electrical cords, etc? Yes  Adequate lighting in your home to reduce risk of falls? Yes   ASSISTIVE DEVICES UTILIZED TO PREVENT FALLS:  Life alert? No  Use of a cane, walker or w/c? No  Grab bars in the bathroom? Yes  Shower chair or bench in shower? No  Elevated toilet seat or a handicapped toilet? Yes   TIMED UP AND GO:  Was the test performed? Yes .   Length of time to ambulate 10 feet: 5 sec.   Gait steady and fast without use of assistive device  Cognitive Function:        03/07/2022    2:17 PM  6CIT Screen  What Year? 0 points  What month? 0 points  What time? 0 points  Count back from 20 0 points  Months in reverse 0 points  Repeat phrase 0 points  Total Score 0 points    Immunizations Immunization History  Administered Date(s) Administered   COVID-19, mRNA, vaccine(Comirnaty)12 years and older 11/16/2021   Fluad Quad(high Dose 65+) 01/02/2020   Influenza-Unspecified 11/16/2021   PFIZER(Purple Top)SARS-COV-2 Vaccination 05/08/2019, 06/02/2019, 02/04/2020   Pneumococcal Conjugate-13 05/13/2013   Pneumococcal Polysaccharide-23 02/28/2016   Tdap 11/02/2011   Zoster Recombinat (Shingrix) 05/19/2020   Zoster, Live 07/13/2010    TDAP status: Up to date  Flu Vaccine status: Up to date  Pneumococcal vaccine status: Up to date  Covid-19 vaccine status: Information provided on how to obtain vaccines.   Qualifies for Shingles Vaccine? Yes   Zostavax completed Yes   Shingrix Completed?: No.    Education has been provided regarding the importance of this vaccine. Patient has been advised to call insurance company to determine out of pocket expense if they have not yet received this vaccine. Advised may also receive vaccine at local pharmacy or Health Dept. Verbalized acceptance and understanding.  Screening Tests Health Maintenance  Topic Date Due   Zoster Vaccines- Shingrix (2 of 2) 07/14/2020   DTaP/Tdap/Td (2 - Td or Tdap) 11/01/2021   Medicare Annual Wellness (AWV)  03/08/2023   Pneumonia Vaccine 48+ Years old  Completed   INFLUENZA VACCINE  Completed   DEXA SCAN  Completed   COVID-19 Vaccine  Completed   Hepatitis C Screening  Completed   HPV VACCINES  Aged Out   COLONOSCOPY (Pts 45-87yrs Insurance coverage will need to be confirmed)  Discontinued    Health Maintenance  Health Maintenance Due  Topic  Date Due   Zoster Vaccines- Shingrix (2 of 2) 07/14/2020   DTaP/Tdap/Td (2 - Td or Tdap) 11/01/2021    Colorectal cancer screening: No longer required.   Mammogram status: No longer required due to age .  Bone Density status: Completed 03/13/16. Results reflect: Bone density results: OSTEOPENIA. Repeat every 2 years.  Lung Cancer Screening: (Low Dose CT Chest recommended if Age 17-80 years, 30 pack-year currently smoking OR have quit w/in 15years.) does not qualify.   Lung Cancer Screening Referral: n/a   Additional Screening:  Hepatitis C Screening: does qualify; Completed 05/11/20  Vision Screening: Recommended annual ophthalmology exams for early detection of glaucoma and other disorders of the eye. Is the patient up to date with their annual eye exam?  Yes  Who is the provider or what is the name of the office in which the patient attends annual eye exams? Dr. Nile Riggs  If pt is not established with a provider, would they like to be referred to a provider to establish care? No .   Dental Screening: Recommended annual dental exams for proper oral hygiene  Community Resource Referral / Chronic Care Management: CRR required this visit?  No   CCM required this visit?  No      Plan:     I have personally reviewed and noted the following in the patient's chart:   Medical and social history Use of alcohol, tobacco or illicit drugs  Current medications and supplements including opioid prescriptions. Patient is not currently taking opioid prescriptions. Functional ability and status Nutritional status Physical activity Advanced directives List of other physicians Hospitalizations, surgeries, and ER visits in previous 12 months Vitals Screenings to include cognitive, depression, and falls Referrals and appointments  In addition, I have reviewed and discussed with patient certain preventive protocols, quality metrics, and best practice recommendations. A written personalized  care plan for preventive services as well as general preventive health recommendations were provided to patient.     Kandis Fantasia [place], [place]   [id number]   Nurse Notes: No concerns

## 2022-03-07 NOTE — Patient Instructions (Signed)
Audrey David , Thank you for taking time to come for your Medicare Wellness Visit. I appreciate your ongoing commitment to your health goals. Please review the following plan we discussed and let me know if I can assist you in the future.   These are the goals we discussed:  Goals      DIET - INCREASE WATER INTAKE        This is a list of the screening recommended for you and due dates:  Health Maintenance  Topic Date Due   Zoster (Shingles) Vaccine (1 of 2) Never done   DTaP/Tdap/Td vaccine (2 - Td or Tdap) 11/01/2021   COVID-19 Vaccine (4 - 2023-24 season) 07/22/2022*   Medicare Annual Wellness Visit  03/08/2023   Pneumonia Vaccine  Completed   Flu Shot  Completed   DEXA scan (bone density measurement)  Completed   Hepatitis C Screening: USPSTF Recommendation to screen - Ages 19-79 yo.  Completed   HPV Vaccine  Aged Out   Colon Cancer Screening  Discontinued  *Topic was postponed. The date shown is not the original due date.    Advanced directives: Advance directive discussed with you today. I have provided a copy for you to complete at home and have notarized. Once this is complete please bring a copy in to our office so we can scan it into your chart.   Conditions/risks identified: Aim for 30 minutes of exercise or brisk walking, 6-8 glasses of water, and 5 servings of fruits and vegetables each day.   Next appointment: Follow up in one year for your annual wellness visit    Preventive Care 65 Years and Older, Female Preventive care refers to lifestyle choices and visits with your health care provider that can promote health and wellness. What does preventive care include? A yearly physical exam. This is also called an annual well check. Dental exams once or twice a year. Routine eye exams. Ask your health care provider how often you should have your eyes checked. Personal lifestyle choices, including: Daily care of your teeth and gums. Regular physical activity. Eating a  healthy diet. Avoiding tobacco and drug use. Limiting alcohol use. Practicing safe sex. Taking low-dose aspirin every day. Taking vitamin and mineral supplements as recommended by your health care provider. What happens during an annual well check? The services and screenings done by your health care provider during your annual well check will depend on your age, overall health, lifestyle risk factors, and family history of disease. Counseling  Your health care provider may ask you questions about your: Alcohol use. Tobacco use. Drug use. Emotional well-being. Home and relationship well-being. Sexual activity. Eating habits. History of falls. Memory and ability to understand (cognition). Work and work Statistician. Reproductive health. Screening  You may have the following tests or measurements: Height, weight, and BMI. Blood pressure. Lipid and cholesterol levels. These may be checked every 5 years, or more frequently if you are over 28 years old. Skin check. Lung cancer screening. You may have this screening every year starting at age 52 if you have a 30-pack-year history of smoking and currently smoke or have quit within the past 15 years. Fecal occult blood test (FOBT) of the stool. You may have this test every year starting at age 2. Flexible sigmoidoscopy or colonoscopy. You may have a sigmoidoscopy every 5 years or a colonoscopy every 10 years starting at age 64. Hepatitis C blood test. Hepatitis B blood test. Sexually transmitted disease (STD) testing. Diabetes screening. This  is done by checking your blood sugar (glucose) after you have not eaten for a while (fasting). You may have this done every 1-3 years. Bone density scan. This is done to screen for osteoporosis. You may have this done starting at age 10. Mammogram. This may be done every 1-2 years. Talk to your health care provider about how often you should have regular mammograms. Talk with your health care  provider about your test results, treatment options, and if necessary, the need for more tests. Vaccines  Your health care provider may recommend certain vaccines, such as: Influenza vaccine. This is recommended every year. Tetanus, diphtheria, and acellular pertussis (Tdap, Td) vaccine. You may need a Td booster every 10 years. Zoster vaccine. You may need this after age 7. Pneumococcal 13-valent conjugate (PCV13) vaccine. One dose is recommended after age 34. Pneumococcal polysaccharide (PPSV23) vaccine. One dose is recommended after age 5. Talk to your health care provider about which screenings and vaccines you need and how often you need them. This information is not intended to replace advice given to you by your health care provider. Make sure you discuss any questions you have with your health care provider. Document Released: 03/05/2015 Document Revised: 10/27/2015 Document Reviewed: 12/08/2014 Elsevier Interactive Patient Education  2017 [place] Prevention in the Home Falls can cause injuries. They can happen to people of all ages. There are many things you can do to make your home safe and to help prevent falls. What can I do on the outside of my home? Regularly fix the edges of walkways and driveways and fix any cracks. Remove anything that might make you trip as you walk through a door, such as a raised step or threshold. Trim any bushes or trees on the path to your home. Use bright outdoor lighting. Clear any walking paths of anything that might make someone trip, such as rocks or tools. Regularly check to see if handrails are loose or broken. Make sure that both sides of any steps have handrails. Any raised decks and porches should have guardrails on the edges. Have any leaves, snow, or ice cleared regularly. Use sand or salt on walking paths during winter. Clean up any spills in your garage right away. This includes oil or grease spills. What can I do in the  bathroom? Use night lights. Install grab bars by the toilet and in the tub and shower. Do not use towel bars as grab bars. Use non-skid mats or decals in the tub or shower. If you need to sit down in the shower, use a plastic, non-slip stool. Keep the floor dry. Clean up any water that spills on the floor as soon as it happens. Remove soap buildup in the tub or shower regularly. Attach bath mats securely with double-sided non-slip rug tape. Do not have throw rugs and other things on the floor that can make you trip. What can I do in the bedroom? Use night lights. Make sure that you have a light by your bed that is easy to reach. Do not use any sheets or blankets that are too big for your bed. They should not hang down onto the floor. Have a firm chair that has side arms. You can use this for support while you get dressed. Do not have throw rugs and other things on the floor that can make you trip. What can I do in the kitchen? Clean up any spills right away. Avoid walking on wet floors. Keep items that  you use a lot in easy-to-reach places. If you need to reach something above you, use a strong step stool that has a grab bar. Keep electrical cords out of the way. Do not use floor polish or wax that makes floors slippery. If you must use wax, use non-skid floor wax. Do not have throw rugs and other things on the floor that can make you trip. What can I do with my stairs? Do not leave any items on the stairs. Make sure that there are handrails on both sides of the stairs and use them. Fix handrails that are broken or loose. Make sure that handrails are as long as the stairways. Check any carpeting to make sure that it is firmly attached to the stairs. Fix any carpet that is loose or worn. Avoid having throw rugs at the top or bottom of the stairs. If you do have throw rugs, attach them to the floor with carpet tape. Make sure that you have a light switch at the top of the stairs and the  bottom of the stairs. If you do not have them, ask someone to add them for you. What else can I do to help prevent falls? Wear shoes that: Do not have high heels. Have rubber bottoms. Are comfortable and fit you well. Are closed at the toe. Do not wear sandals. If you use a stepladder: Make sure that it is fully opened. Do not climb a closed stepladder. Make sure that both sides of the stepladder are locked into place. Ask someone to hold it for you, if possible. Clearly mark and make sure that you can see: Any grab bars or handrails. First and last steps. Where the edge of each step is. Use tools that help you move around (mobility aids) if they are needed. These include: Canes. Walkers. Scooters. Crutches. Turn on the lights when you go into a dark area. Replace any light bulbs as soon as they burn out. Set up your furniture so you have a clear path. Avoid moving your furniture around. If any of your floors are uneven, fix them. If there are any pets around you, be aware of where they are. Review your medicines with your doctor. Some medicines can make you feel dizzy. This can increase your chance of falling. Ask your doctor what other things that you can do to help prevent falls. This information is not intended to replace advice given to you by your health care provider. Make sure you discuss any questions you have with your health care provider. Document Released: 12/03/2008 Document Revised: 07/15/2015 Document Reviewed: 03/13/2014 Elsevier Interactive Patient Education  2017 [employer].

## 2022-03-30 ENCOUNTER — Encounter: Payer: Self-pay | Admitting: Family Medicine

## 2022-03-30 ENCOUNTER — Ambulatory Visit (INDEPENDENT_AMBULATORY_CARE_PROVIDER_SITE_OTHER): Payer: Medicare Other | Admitting: Family Medicine

## 2022-03-30 VITALS — BP 132/84 | HR 80 | Temp 97.6°F | Ht 64.0 in | Wt 156.0 lb

## 2022-03-30 DIAGNOSIS — M8589 Other specified disorders of bone density and structure, multiple sites: Secondary | ICD-10-CM | POA: Diagnosis not present

## 2022-03-30 DIAGNOSIS — Z1211 Encounter for screening for malignant neoplasm of colon: Secondary | ICD-10-CM

## 2022-03-30 DIAGNOSIS — I10 Essential (primary) hypertension: Secondary | ICD-10-CM

## 2022-03-30 DIAGNOSIS — Z0001 Encounter for general adult medical examination with abnormal findings: Secondary | ICD-10-CM | POA: Diagnosis not present

## 2022-03-30 DIAGNOSIS — Z0000 Encounter for general adult medical examination without abnormal findings: Secondary | ICD-10-CM

## 2022-03-30 NOTE — Progress Notes (Signed)
Subjective:    Patient ID: Audrey David, female    DOB: August 07, 1945, 77 y.o.   MRN: [id number]  HPI Patient is a very pleasant 77 year old Caucasian female here today for complete physical exam.  She has had her flu shot.  She has had her most recent COVID booster in September.  She has had Prevnar 13 and Pneumovax 23.  She is due for the RSV vaccine.  She is also due for the second shingles shot.  She had the first dose in 2022.  Her mammogram was performed last fall was normal.  She never got the colonoscopy that we discussed at her last visit.  We discussed the pros and cons and she would like to go for the Cologuard screening.  Her last bone density test was in 2019.  This is overdue.  She had history of osteopenia.  She denies any falls or any loss. 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 30 tablet 0   APPLE CIDER  VINEGAR PO Take 1 capsule by mouth daily.     Ascorbic Acid (VITAMIN C PO) Take 1 tablet by mouth daily.     Cholecalciferol (VITAMIN D3) 10 MCG (400 UNIT) CAPS TAKE 2 CAPSULES DAILY 180 capsule 3   loratadine (CLARITIN) 10 MG tablet Take 1 tablet (10 mg total) by mouth daily. For itching and swelling. 10 tablet 0   losartan (COZAAR) 100 MG tablet TAKE ONE-HALF (1/2) TABLET DAILY 45 tablet 3   naphazoline-pheniramine (NAPHCON-A) 0.025-0.3 % ophthalmic solution Place 1 drop into both eyes 4 (four) times daily as needed for eye irritation. 15 mL 0   tiZANidine (ZANAFLEX) 4 MG tablet TAKE 1 TABLET TWICE A DAY 180 tablet 3   Turmeric 500 MG TABS Take 500 mg by mouth 2 (two) times daily.     VITAMIN E PO Take 1 tablet by mouth daily.     zinc gluconate 50 MG tablet Take 50 mg by mouth daily.     zolpidem (AMBIEN CR) 12.5 MG CR tablet Take 1 tablet (12.5 mg total) by mouth at bedtime. 90 tablet 0   No current facility-administered medications on file prior to visit.   Allergies  Allergen Reactions   Ace Inhibitors Cough    Other reaction(s): Cough (ALLERGY/intolerance)   Codeine Nausea Only    60 years ago   Social History  Socioeconomic History   Marital status: Single    Spouse name: Not on file   Number of children: Not on file   Years of education: Not on file   Highest education level: Not on file  Occupational History   Not on file  Tobacco Use   Smoking status: Former   Smokeless tobacco: Never   Tobacco comments:     " quit smoking cigarettes in 2007 "  Substance and Sexual Activity   Alcohol use: Yes    Comment: Social    Drug use: No   Sexual activity: Not on file  Other Topics Concern   Not on file  Social History Narrative   Daily caffeine    Social Determinants of Health   Financial Resource Strain: Low Risk  (03/07/2022)   Overall Financial Resource Strain (CARDIA)    Difficulty of Paying Living Expenses: Not hard at all  Food Insecurity: No Food Insecurity  (03/07/2022)   Hunger Vital Sign    Worried About Running Out of Food in the Last Year: Never true    Ran Out of Food in the Last Year: Never true  Transportation Needs: No Transportation Needs (03/07/2022)   PRAPARE - Hydrologist (Medical): No    Lack of Transportation (Non-Medical): No  Physical Activity: Sufficiently Active (03/07/2022)   Exercise Vital Sign    Days of Exercise per Week: 7 days    Minutes of Exercise per Session: 30 min  Stress: No Stress Concern Present (03/07/2022)   [place]    Feeling of Stress : Not at all  Social Connections: Moderately Integrated (03/07/2022)   Social Connection and Isolation Panel [NHANES]    Frequency of Communication with Friends and Family: More than three times a week    Frequency of Social Gatherings with Friends and Family: Three times a week    Attends Religious Services: More than 4 times per year    Active Member of Clubs or Organizations: Yes    Attends Archivist Meetings: More than 4 times per year    Marital Status: Never married  Intimate Partner Violence: Not At Risk (03/07/2022)   Humiliation, Afraid, Rape, and Kick questionnaire    Fear of Current or Ex-Partner: No    Emotionally Abused: No    Physically Abused: No    Sexually Abused: No   Family History  Problem Relation Age of Onset   Hypertension Mother    Cancer Father 23       multiple myeloma. Throat Cancer.   Breast cancer Niece 26       left BrCA - opted for bilat mastectomy      Review of Systems  Respiratory:  Negative for apnea, cough, choking, chest tightness, shortness of breath and wheezing.   Cardiovascular:  Negative for chest pain, palpitations and leg swelling.  All other systems reviewed and are negative.      Objective:   Physical Exam Vitals reviewed.  Constitutional:      General: She is not in acute distress.    Appearance: Normal  appearance. She is well-developed and normal weight. She is not ill-appearing, toxic-appearing or diaphoretic.  HENT:     Head: Normocephalic and atraumatic.     Right Ear: Tympanic membrane, ear canal and external ear normal. There is no impacted cerumen.     Left Ear: Tympanic membrane, ear canal and external ear normal. There is no impacted cerumen.  Nose: Nose normal. No congestion or rhinorrhea.     Mouth/Throat:     Mouth: Mucous membranes are moist.     Pharynx: Oropharynx is clear. No oropharyngeal exudate or posterior oropharyngeal erythema.  Eyes:     Extraocular Movements: Extraocular movements intact.     Conjunctiva/sclera: Conjunctivae normal.     Pupils: Pupils are equal, round, and reactive to light.  Neck:     Thyroid: No thyromegaly.     Vascular: No carotid bruit or JVD.  Cardiovascular:     Rate and Rhythm: Normal rate and regular rhythm.     Heart sounds: Normal heart sounds. No murmur heard.    No friction rub. No gallop.  Pulmonary:     Effort: Pulmonary effort is normal. No respiratory distress.     Breath sounds: Normal breath sounds. No stridor. No wheezing, rhonchi or rales.  Chest:     Chest wall: No tenderness.  Abdominal:     General: Bowel sounds are normal. There is no distension.     Palpations: Abdomen is soft. There is no mass.     Tenderness: There is no abdominal tenderness. There is no guarding or rebound.  Musculoskeletal:     Cervical back: Normal range of motion and neck supple. No rigidity or tenderness.     Right lower leg: No edema.     Left lower leg: No edema.  Lymphadenopathy:     Cervical: No cervical adenopathy.  Skin:    Coloration: Skin is not jaundiced.     Findings: No bruising, erythema, lesion or rash.  Neurological:     General: No focal deficit present.     Mental Status: She is alert and oriented to person, place, and time. Mental status is at baseline.     Cranial Nerves: No cranial nerve deficit.     Sensory: No  sensory deficit.     Motor: No weakness or abnormal muscle tone.     Coordination: Coordination normal.     Gait: Gait normal.     Deep Tendon Reflexes: Reflexes are normal and symmetric. Reflexes normal.  Psychiatric:        Mood and Affect: Mood normal.        Behavior: Behavior normal.        Thought Content: Thought content normal.        Judgment: Judgment normal.           Assessment & Plan:  General medical exam  Essential hypertension - Plan: CBC with Differential/Platelet, Lipid panel, COMPLETE METABOLIC PANEL WITH GFR  Colon cancer screening - Plan: Cologuard  Osteopenia of multiple sites - Plan: DG Bone Density Patient appears to be in remarkable health for her age.  She does have 3 discrete papules in the subcutaneous fat over her abdomen.  These are mobile and nontender.  I believe these are likely cyst or lipomas.  I offered the patient referral to a surgeon to have them excised however they have been present for more than a year and they do not cause her any pain.  Mammogram is up-to-date.  I will schedule the patient for.  Check CBC CMP and a lipid panel.  Schedule the patient for a bone density test.  Flu shot and COVID shot are up-to-date along with pneumonia vaccination.  Recommended the second dose of Shingrix.  We discussed the RSV vaccine

## 2022-03-31 LAB — COMPLETE METABOLIC PANEL WITH GFR
AG Ratio: 2 (calc) (ref 1.0–2.5)
ALT: 17 U/L (ref 6–29)
AST: 14 U/L (ref 10–35)
Albumin: 4.1 g/dL (ref 3.6–5.1)
Alkaline phosphatase (APISO): 96 U/L (ref 37–153)
BUN: 14 mg/dL (ref 7–25)
CO2: 26 mmol/L (ref 20–32)
Calcium: 9.3 mg/dL (ref 8.6–10.4)
Chloride: 110 mmol/L (ref 98–110)
Creat: 0.85 mg/dL (ref 0.60–1.00)
Globulin: 2.1 g/dL (calc) (ref 1.9–3.7)
Glucose, Bld: 96 mg/dL (ref 65–99)
Potassium: 4.5 mmol/L (ref 3.5–5.3)
Sodium: 145 mmol/L (ref 135–146)
Total Bilirubin: 0.4 mg/dL (ref 0.2–1.2)
Total Protein: 6.2 g/dL (ref 6.1–8.1)
eGFR: 71 mL/min/{1.73_m2} (ref >=60)

## 2022-03-31 LAB — CBC WITH DIFFERENTIAL/PLATELET
Absolute Monocytes: 677 cells/uL (ref 200–950)
Basophils Absolute: 47 cells/uL (ref 0–200)
Basophils Relative: 0.7 %
Eosinophils Absolute: 382 cells/uL (ref 15–500)
Eosinophils Relative: 5.7 %
HCT: 36.4 % (ref 35.0–45.0)
Hemoglobin: 11.7 g/dL (ref 11.7–15.5)
Lymphs Abs: 1621 cells/uL (ref 850–3900)
MCH: 27.6 pg (ref 27.0–33.0)
MCHC: 32.1 g/dL (ref 32.0–36.0)
MCV: 85.8 fL (ref 80.0–100.0)
MPV: 11.1 fL (ref 7.5–12.5)
Monocytes Relative: 10.1 %
Neutro Abs: 3973 cells/uL (ref 1500–7800)
Neutrophils Relative %: 59.3 %
Platelets: 234 10*3/uL (ref 140–400)
RBC: 4.24 10*6/uL (ref 3.80–5.10)
RDW: 12.6 % (ref 11.0–15.0)
Total Lymphocyte: 24.2 %
WBC: 6.7 10*3/uL (ref 3.8–10.8)

## 2022-03-31 LAB — LIPID PANEL
Cholesterol: 184 mg/dL (ref <200)
HDL: 52 mg/dL (ref >=50)
LDL Cholesterol (Calc): 103 mg/dL (calc) — ABNORMAL HIGH
Non-HDL Cholesterol (Calc): 132 mg/dL (calc) — ABNORMAL HIGH (ref <130)
Total CHOL/HDL Ratio: 3.5 (calc) (ref <5.0)
Triglycerides: 175 mg/dL — ABNORMAL HIGH (ref <150)

## 2022-05-15 ENCOUNTER — Other Ambulatory Visit: Payer: Self-pay | Admitting: Family Medicine

## 2022-05-15 ENCOUNTER — Telehealth: Payer: Self-pay | Admitting: Family Medicine

## 2022-05-15 DIAGNOSIS — G4700 Insomnia, unspecified: Secondary | ICD-10-CM

## 2022-05-15 MED ORDER — ZOLPIDEM TARTRATE ER 12.5 MG PO TBCR: 12.5000 mg | EXTENDED_RELEASE_TABLET | Freq: Every day | ORAL | 0 refills | Status: DC

## 2022-05-15 NOTE — Telephone Encounter (Signed)
Prescription Request  05/15/2022  LOV: 03/30/2022  What is the name of the medication or equipment?   zolpidem (AMBIEN CR) 12.5 MG CR tablet   Have you contacted your pharmacy to request a refill? Yes   Which pharmacy would you like this sent to?  [place], [place] [address] Phone: [phone] Fax: [phone]    Patient notified that their request is being sent to the clinical staff for review and that they should receive a response within 2 business days.   Please advise patient when script sent at [phone].

## 2022-05-17 NOTE — Telephone Encounter (Signed)
Patient called to report notification she received from the pharmacy stating they're unable to give her refill; they advised her to contact our office to request a new script.  Please advise patient at [phone].

## 2022-05-18 ENCOUNTER — Other Ambulatory Visit: Payer: Self-pay | Admitting: Family Medicine

## 2022-05-18 DIAGNOSIS — G4700 Insomnia, unspecified: Secondary | ICD-10-CM

## 2022-05-18 MED ORDER — ZOLPIDEM TARTRATE ER 12.5 MG PO TBCR: 12.5000 mg | EXTENDED_RELEASE_TABLET | Freq: Every day | ORAL | 0 refills | Status: DC

## 2022-05-29 LAB — COLOGUARD: COLOGUARD: NEGATIVE

## 2022-08-09 ENCOUNTER — Other Ambulatory Visit: Payer: Self-pay | Admitting: Family Medicine

## 2022-08-09 DIAGNOSIS — G4700 Insomnia, unspecified: Secondary | ICD-10-CM

## 2022-08-10 NOTE — Telephone Encounter (Signed)
Requested medications are due for refill today.  yes  Requested medications are on the active medications list.  yes  Last refill. 05/18/2022 #90 0 rf  Future visit scheduled.   yes  Notes to clinic.  Refill not delegated.    Requested Prescriptions  Pending Prescriptions Disp Refills   zolpidem (AMBIEN CR) 12.5 MG CR tablet [Pharmacy Med Name: ZOLPIDEM TARTRATE ER TABS 12.5MG ] 90 tablet 0    Sig: TAKE 1 TABLET AT BEDTIME     Not Delegated - Psychiatry:  Anxiolytics/Hypnotics Failed - 08/09/2022  4:59 PM      Failed - This refill cannot be delegated      Failed - Urine Drug Screen completed in last 360 days      Failed - Valid encounter within last 6 months    Recent Outpatient Visits           1 year ago Allergic contact dermatitis due to plant   [facility] Pickard, Priscille Heidelberg, MD   1 year ago Chronic pain of right knee   [facility] Donita Brooks, MD   2 years ago General medical exam   [facility] Donita Brooks, MD   2 years ago Allergic contact dermatitis due to plant   [facility] Lawson Fiscal A, FNP   2 years ago Depression, major, single episode, moderate (HCC)   [facility] Pickard, Priscille Heidelberg, MD

## 2022-09-14 ENCOUNTER — Ambulatory Visit
Admission: RE | Admit: 2022-09-14 | Discharge: 2022-09-14 | Disposition: A | Discharge status: Home / Self Care | Payer: Medicare Other | Source: Ambulatory Visit | Attending: Family Medicine | Admitting: Family Medicine

## 2022-09-14 DIAGNOSIS — M8589 Other specified disorders of bone density and structure, multiple sites: Secondary | ICD-10-CM

## 2022-09-15 ENCOUNTER — Other Ambulatory Visit: Payer: Self-pay

## 2022-09-15 MED ORDER — ALENDRONATE SODIUM 70 MG PO TABS: 70.0000 mg | ORAL_TABLET | ORAL | 11 refills | Status: DC

## 2022-09-22 ENCOUNTER — Other Ambulatory Visit: Payer: Self-pay | Admitting: Family Medicine

## 2022-09-22 ENCOUNTER — Telehealth: Payer: Self-pay | Admitting: Family Medicine

## 2022-09-22 NOTE — Telephone Encounter (Signed)
Patient called regarding commercial she saw advertising a blood test for determining possibility of developing alzheimers. Requesting call back to see if we offer that test.  Please advise at [phone].

## 2022-10-02 ENCOUNTER — Other Ambulatory Visit: Payer: Self-pay | Admitting: Family Medicine

## 2022-10-02 DIAGNOSIS — Z1231 Encounter for screening mammogram for malignant neoplasm of breast: Secondary | ICD-10-CM

## 2022-11-02 ENCOUNTER — Ambulatory Visit: Payer: Medicare Other

## 2022-11-06 ENCOUNTER — Ambulatory Visit
Admission: RE | Admit: 2022-11-06 | Discharge: 2022-11-06 | Disposition: A | Discharge status: Home / Self Care | Payer: Medicare Other | Source: Ambulatory Visit | Attending: Family Medicine | Admitting: Family Medicine

## 2022-11-06 ENCOUNTER — Other Ambulatory Visit: Payer: Self-pay | Admitting: Family Medicine

## 2022-11-06 ENCOUNTER — Telehealth: Payer: Self-pay | Admitting: Family Medicine

## 2022-11-06 DIAGNOSIS — Z1231 Encounter for screening mammogram for malignant neoplasm of breast: Secondary | ICD-10-CM

## 2022-11-06 DIAGNOSIS — G4700 Insomnia, unspecified: Secondary | ICD-10-CM

## 2022-11-06 NOTE — Telephone Encounter (Signed)
Prescription Request  11/06/2022  LOV: 03/30/2022  What is the name of the medication or equipment?   zolpidem (AMBIEN CR) 12.5 MG CR tablet [[id number]]  **patient meant to call sooner; requesting refill be sent today if possible**  Have you contacted your pharmacy to request a refill? Yes   Which pharmacy would you like this sent to?  [facility] - [place], [place] - [address] [place] [address] Phone: [phone] Fax: [phone]    Patient notified that their request is being sent to the clinical staff for review and that they should receive a response within 2 business days.   Please advise patient at [phone].

## 2022-11-07 ENCOUNTER — Other Ambulatory Visit: Payer: Self-pay | Admitting: Family Medicine

## 2022-11-07 DIAGNOSIS — G4700 Insomnia, unspecified: Secondary | ICD-10-CM

## 2022-11-07 MED ORDER — ZOLPIDEM TARTRATE ER 12.5 MG PO TBCR
12.5000 mg | EXTENDED_RELEASE_TABLET | Freq: Every day | ORAL | 0 refills | Status: DC
Start: 2022-11-07 — End: 2023-02-01

## 2022-11-07 NOTE — Telephone Encounter (Signed)
Requested medication (s) are due for refill today:   Provider to review though it looks like he has already approved it today but got this duplicate request.  Requested medication (s) are on the active medication list:   Yes  Future visit scheduled:   No    LOV 03/30/2022   Last ordered: 11/07/2022 #90, 0 refills  Non delegated refill   Duplicate request   Requested Prescriptions  Pending Prescriptions Disp Refills   zolpidem (AMBIEN CR) 12.5 MG CR tablet [Pharmacy Med Name: ZOLPIDEM TARTRATE ER TABS 12.5MG ] 90 tablet 0    Sig: TAKE 1 TABLET AT BEDTIME     Not Delegated - Psychiatry:  Anxiolytics/Hypnotics Failed - 11/06/2022 12:26 PM      Failed - This refill cannot be delegated      Failed - Urine Drug Screen completed in last 360 days      Failed - Valid encounter within last 6 months    Recent Outpatient Visits           1 year ago Allergic contact dermatitis due to plant   [facility] Pickard, Priscille Heidelberg, MD   1 year ago Chronic pain of right knee   [facility] Donita Brooks, MD   2 years ago General medical exam   [facility] Donita Brooks, MD   3 years ago Allergic contact dermatitis due to plant   [facility] Medicine Jenne Pane, Crystal A, FNP   3 years ago Depression, major, single episode, moderate (HCC)   [facility] Pickard, Priscille Heidelberg, MD

## 2022-11-29 ENCOUNTER — Other Ambulatory Visit: Payer: Self-pay | Admitting: Family Medicine

## 2022-11-29 MED ORDER — TIZANIDINE HCL 4 MG PO TABS: 4.0000 mg | ORAL_TABLET | Freq: Two times a day (BID) | ORAL | 0 refills | Status: DC

## 2022-11-29 NOTE — Telephone Encounter (Signed)
Prescription Request  11/29/2022  LOV: 03/30/2022  What is the name of the medication or equipment?   tiZANidine (ZANAFLEX) 4 MG tablet  **new 90 day script requested**  Have you contacted your pharmacy to request a refill? Yes   Which pharmacy would you like this sent to?  [facility] - [place], [place] - [address] [place] [address] Phone: [phone] Fax: [phone]    Patient notified that their request is being sent to the clinical staff for review and that they should receive a response within 2 business days.   Please advise pharmacist.

## 2022-11-29 NOTE — Telephone Encounter (Signed)
Requested medication (s) are due for refill today: Yes  Requested medication (s) are on the active medication list: Yes  Last refill:  01/17/22  Future visit scheduled: No  Notes to clinic:  Unable to refill per protocol, cannot delegate.      Requested Prescriptions  Pending Prescriptions Disp Refills   tiZANidine (ZANAFLEX) 4 MG tablet 180 tablet 3    Sig: Take 1 tablet (4 mg total) by mouth 2 (two) times daily.     Not Delegated - Cardiovascular:  Alpha-2 Agonists - tizanidine Failed - 11/29/2022  2:28 PM      Failed - This refill cannot be delegated      Failed - Valid encounter within last 6 months    Recent Outpatient Visits           1 year ago Allergic contact dermatitis due to plant   [facility] Pickard, Priscille Heidelberg, MD   1 year ago Chronic pain of right knee   [facility] Tanya Nones Priscille Heidelberg, MD   2 years ago General medical exam   [facility] Donita Brooks, MD   3 years ago Allergic contact dermatitis due to plant   [facility] Lawson Fiscal A, FNP   3 years ago Depression, major, single episode, moderate (HCC)   [facility] Pickard, Priscille Heidelberg, MD

## 2022-12-22 ENCOUNTER — Other Ambulatory Visit: Payer: Self-pay | Admitting: Family Medicine

## 2022-12-22 ENCOUNTER — Telehealth: Payer: Self-pay

## 2022-12-22 MED ORDER — PREDNISONE 20 MG PO TABS: ORAL_TABLET | ORAL | 0 refills | Status: DC

## 2022-12-22 NOTE — Telephone Encounter (Signed)
Pt called in stating that she was working in her yard today and afterwards noticed that her hands and eyes were swollen. Pt would llike to know if pcp would send in something to help with this please.  Cb#: [phone]

## 2022-12-22 NOTE — Telephone Encounter (Signed)
Patient walked in to follow up on request. Spoke with provider who said he will call in prednisone for her.   Pharmacy confirmed as:  [id number] [place], [place] - [address] [place] [address] Phone: [phone]  Fax: [phone] DEA #: [id number]   Please advise at [phone].

## 2023-01-08 ENCOUNTER — Other Ambulatory Visit: Payer: Self-pay | Admitting: Family Medicine

## 2023-01-08 NOTE — Telephone Encounter (Signed)
Requested medications are due for refill today.  yes  Requested medications are on the active medications list.  Yes  Last refill. 01/11/2022 #45 3 rf  Future visit scheduled.   yes  Notes to clinic.  Pt last seen 03/30/2022. Pt has upcoming appt in January. Unsure if provider wants to see  pt sooner. Please advise.    Requested Prescriptions  Pending Prescriptions Disp Refills   losartan (COZAAR) 100 MG tablet [Pharmacy Med Name: LOSARTAN TABS 100MG ] 45 tablet 3    Sig: TAKE ONE-HALF (1/2) TABLET DAILY     Cardiovascular:  Angiotensin Receptor Blockers Failed - 01/08/2023 12:16 AM      Failed - Cr in normal range and within 180 days    Creat  Date Value Ref Range Status  03/30/2022 0.85 0.60 - 1.00 mg/dL Final         Failed - K in normal range and within 180 days    Potassium  Date Value Ref Range Status  03/30/2022 4.5 3.5 - 5.3 mmol/L Final         Failed - Valid encounter within last 6 months    Recent Outpatient Visits           1 year ago Allergic contact dermatitis due to plant   [facility] Pickard, Priscille Heidelberg, MD   1 year ago Chronic pain of right knee   [facility] Tanya Nones, Priscille Heidelberg, MD   2 years ago General medical exam   [facility] Donita Brooks, MD   3 years ago Allergic contact dermatitis due to plant   [facility] Jenne Pane, Crystal A, FNP   3 years ago Depression, major, single episode, moderate (HCC)   [facility] Pickard, Priscille Heidelberg, MD              Passed - Patient is not pregnant      Passed - Last BP in normal range    BP Readings from Last 1 Encounters:  03/30/22 132/84

## 2023-01-12 ENCOUNTER — Telehealth: Payer: Self-pay

## 2023-01-12 MED ORDER — LOSARTAN POTASSIUM 100 MG PO TABS: 50.0000 mg | ORAL_TABLET | Freq: Every day | ORAL | 3 refills | Status: DC

## 2023-01-12 NOTE — Telephone Encounter (Signed)
Copied from CRM [id number]. Topic: Clinical - Medication Refill >> Jan 12, 2023 10:44 AM Adelina Mings wrote: Most Recent Primary Care Visit:  Provider: Lynnea Ferrier T  Department: [facility]  Visit Type: PHYSICAL  Date: 03/30/2022  Medication: losartan (COZAAR) 100 MG tablet  Has the patient contacted their pharmacy? Yes requesting a prescription renewal (Agent: If no, request that the patient contact the pharmacy for the refill. If patient does not wish to contact the pharmacy document the reason why and proceed with request.) (Agent: If yes, when and what did the pharmacy advise?)  Is this the correct pharmacy for this prescription? Yes If no, delete pharmacy and type the correct one.  This is the patient's preferred pharmacy:  [facility] - [place], [place] - [address] [place] [address] Phone: [phone] Fax: [phone]   Has the prescription been filled recently? No  Is the patient out of the medication? No  Has the patient been seen for an appointment in the last year OR does the patient have an upcoming appointment? Yes  Can we respond through MyChart? No  Agent: Please be advised that Rx refills may take up to 3 business days. We ask that you follow-up with your pharmacy.

## 2023-01-12 NOTE — Telephone Encounter (Signed)
Pt called back, pt has appt on 01/23/23 at 230pm of knee injection, however, she can called on Monday to see if anyone has cancelled appt and try to get in then.   Pt voiced understanding.

## 2023-01-12 NOTE — Telephone Encounter (Signed)
Copied from CRM [id number]. Topic: Clinical - Medical Advice >> Jan 12, 2023 10:40 AM Adelina Mings wrote: Reason for CRM: Patient is requesting a cortisone shot in right knee for pain  -----  Tried calling pt back, only VM. LVM for pt to return call

## 2023-01-23 ENCOUNTER — Ambulatory Visit: Payer: Medicare Other | Admitting: Family Medicine

## 2023-01-23 ENCOUNTER — Encounter: Payer: Self-pay | Admitting: Family Medicine

## 2023-01-23 VITALS — Ht 64.0 in | Wt 153.4 lb

## 2023-01-23 DIAGNOSIS — G8929 Other chronic pain: Secondary | ICD-10-CM | POA: Diagnosis not present

## 2023-01-23 DIAGNOSIS — M25561 Pain in right knee: Secondary | ICD-10-CM

## 2023-01-23 NOTE — Progress Notes (Signed)
Subjective:    Patient ID: Audrey David, female    DOB: [birth year], 77 y.o.   MRN: [id number]  Knee Pain    Patient again presents with right-sided knee pain.  The pain is located diffuse around the joint.  She has a small effusion.  There is no erythema.  She is tender to palpation over the medial and lateral joint line.  It hurts to stand.  It hurts to bend.  It hurts to walk.  Approximately 1 year ago, a family member gave her cortisone injection which seemed to help.  She has not had any x-rays.  She denies any laxity. 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 Take with a full glass of water on an empty stomach. 4 tablet 11   APPLE CIDER VINEGAR PO Take 1 capsule by mouth daily.     Ascorbic Acid (VITAMIN C PO) Take 1 tablet by mouth daily.     Cholecalciferol (VITAMIN D3) 10 MCG (400 UNIT) CAPS TAKE 2 CAPSULES DAILY 180 capsule 3   losartan (COZAAR) 100 MG tablet Take 0.5 tablets (50 mg total) by mouth daily. 45 tablet 3   tiZANidine (ZANAFLEX) 4 MG tablet Take 1 tablet (4 mg total) by mouth 2 (two) times daily. 180 tablet 0   Turmeric 500 MG TABS Take 500 mg by mouth 2 (two) times daily.     VITAMIN E PO Take 1 tablet by mouth daily.     zinc gluconate 50 MG tablet Take 50 mg by mouth daily.     zolpidem (AMBIEN CR) 12.5 MG CR tablet Take 1 tablet (12.5 mg total) by mouth at  bedtime. 90 tablet 0   ALPRAZolam (XANAX) 0.5 MG tablet Take 1 tablet (0.5 mg total) by mouth 3 (three) times daily as needed for anxiety. (Patient not taking: Reported on 01/23/2023) 30 tablet 0   loratadine (CLARITIN) 10 MG tablet Take 1 tablet (10 mg total) by mouth daily. For itching and swelling. (Patient not taking: Reported on 01/23/2023) 10 tablet 0   naphazoline-pheniramine (NAPHCON-A) 0.025-0.3 % ophthalmic solution Place 1 drop into both eyes 4 (four) times daily as needed for eye irritation. (Patient not taking: Reported on 01/23/2023) 15 mL 0   predniSONE (DELTASONE) 20 MG tablet 3 tabs poqday 1-2, 2 tabs poqday 3-4, 1 tab poqday 5-6 (Patient not taking: Reported on 01/23/2023) 12 tablet 0   No current facility-administered medications on file prior to visit.   Allergies  Allergen Reactions   Ace Inhibitors Cough    Other reaction(s): Cough (ALLERGY/intolerance)   Codeine Nausea Only    60 years ago   Social History   Socioeconomic History   Marital status: Single    Spouse name: Not on file   Number  of children: Not on file   Years of education: Not on file   Highest education level: Not on file  Occupational History   Not on file  Tobacco Use   Smoking status: Former   Smokeless tobacco: Never   Tobacco comments:     " quit smoking cigarettes in 2007 "  Substance and Sexual Activity   Alcohol use: Yes    Comment: Social    Drug use: No   Sexual activity: Not on file  Other Topics Concern   Not on file  Social History Narrative   Daily caffeine    Social Determinants of Health   Financial Resource Strain: Low Risk  (03/07/2022)   Overall Financial Resource Strain (CARDIA)    Difficulty of Paying Living Expenses: Not hard at all  Food Insecurity: No Food Insecurity (03/07/2022)   Hunger Vital Sign    Worried About Running Out of Food in the Last Year: Never true    Ran Out of Food in the Last Year: Never true  Transportation Needs: No Transportation Needs  (03/07/2022)   PRAPARE - Administrator, Civil Service (Medical): No    Lack of Transportation (Non-Medical): No  Physical Activity: Sufficiently Active (03/07/2022)   Exercise Vital Sign    Days of Exercise per Week: 7 days    Minutes of Exercise per Session: 30 min  Stress: No Stress Concern Present (03/07/2022)   [employer] of Occupational Health - Occupational Stress Questionnaire    Feeling of Stress : Not at all  Social Connections: Moderately Integrated (03/07/2022)   Social Connection and Isolation Panel [NHANES]    Frequency of Communication with Friends and Family: More than three times a week    Frequency of Social Gatherings with Friends and Family: Three times a week    Attends Religious Services: More than 4 times per year    Active Member of Clubs or Organizations: Yes    Attends Banker Meetings: More than 4 times per year    Marital Status: Never married  Intimate Partner Violence: Not At Risk (03/07/2022)   Humiliation, Afraid, Rape, and Kick questionnaire    Fear of Current or Ex-Partner: No    Emotionally Abused: No    Physically Abused: No    Sexually Abused: No     Review of Systems  All other systems reviewed and are negative.      Objective:   Physical Exam Vitals reviewed.  Constitutional:      Appearance: Normal appearance.  Cardiovascular:     Rate and Rhythm: Normal rate and regular rhythm.     Pulses: Normal pulses.     Heart sounds: Normal heart sounds.  Pulmonary:     Effort: Pulmonary effort is normal.     Breath sounds: Normal breath sounds.  Musculoskeletal:     Right shoulder: Tenderness and bony tenderness present. No swelling, deformity, effusion or crepitus. Decreased range of motion. Decreased strength.     Right knee: No effusion. Decreased range of motion. Tenderness present over the medial joint line and lateral joint line.  Neurological:     Mental Status: She is alert.           Assessment &  Plan:  Chronic pain of right knee - Plan: DG Knee Complete 4 Views Right I suspect osteoarthritis of the knee based on her exam.  Using sterile technique, I injected the knee with 2 cc of lidocaine, 2 cc of Marcaine, and 2 cc  of 40 mg/mL Kenalog.  The patient tolerated the procedure well without complication.  Proceed with an x-ray of the knee to evaluate further.

## 2023-01-24 ENCOUNTER — Ambulatory Visit
Admission: RE | Admit: 2023-01-24 | Discharge: 2023-01-24 | Disposition: A | Discharge status: Home / Self Care | Payer: Medicare Other | Source: Ambulatory Visit | Attending: Family Medicine | Admitting: Family Medicine

## 2023-01-24 DIAGNOSIS — G8929 Other chronic pain: Secondary | ICD-10-CM

## 2023-01-30 ENCOUNTER — Other Ambulatory Visit: Payer: Self-pay | Admitting: Family Medicine

## 2023-01-30 DIAGNOSIS — G4700 Insomnia, unspecified: Secondary | ICD-10-CM

## 2023-01-30 MED ORDER — TRIAMCINOLONE ACETONIDE 40 MG/ML IJ SUSP
80.0000 mg | Freq: Once | INTRAMUSCULAR | Status: AC
Start: 2023-01-30 — End: 2023-01-23
  Administered 2023-01-23: 80 mg via INTRA_ARTICULAR

## 2023-01-30 NOTE — Addendum Note (Signed)
Addended by: Venia Carbon K on: 01/30/2023 03:22 PM   Modules accepted: Orders

## 2023-02-01 ENCOUNTER — Other Ambulatory Visit: Payer: Self-pay | Admitting: Family Medicine

## 2023-02-01 DIAGNOSIS — G4700 Insomnia, unspecified: Secondary | ICD-10-CM

## 2023-02-01 MED ORDER — ZOLPIDEM TARTRATE ER 12.5 MG PO TBCR: 12.5000 mg | EXTENDED_RELEASE_TABLET | Freq: Every day | ORAL | 0 refills | Status: DC

## 2023-04-18 ENCOUNTER — Telehealth: Payer: Self-pay

## 2023-04-18 ENCOUNTER — Other Ambulatory Visit: Payer: Self-pay | Admitting: Family Medicine

## 2023-04-18 DIAGNOSIS — G4700 Insomnia, unspecified: Secondary | ICD-10-CM

## 2023-04-18 NOTE — Telephone Encounter (Signed)
 Copied from CRM [id number]. Topic: Clinical - Medication Refill >> Apr 18, 2023 12:29 PM Tiffany B wrote: Most Recent Primary Care Visit:  Provider: Lynnea Ferrier T  Department: [facility]  Visit Type: OFFICE VISIT  Date: 01/23/2023  Medication:zolpidem (AMBIEN CR) 12.5 MG CR tablet, , losartan (COZAAR) 100 MG tablet  Has the patient contacted their pharmacy? Yes, Patient has a new pharmacy and states she is not due until 05/10/2023 but would like to make PCP aware please send medication to her new pharmacy.Patient is going on a cruise in April and does not want any delays.    Is this the correct pharmacy for this prescription? Yes  [id number] [address] [address], [place] [facility] [address] [place] [address] Phone: [phone] Fax: [phone]   Has the prescription been filled recently? Yes  Is the patient out of the medication? No  Has the patient been seen for an appointment in the last year OR does the patient have an upcoming appointment? Yes  Can we respond through MyChart? No  Agent: Please be advised that Rx refills may take up to 3 business days. We ask that you follow-up with your pharmacy.

## 2023-04-18 NOTE — Telephone Encounter (Signed)
 Copied from CRM [id number]. Topic: Referral - Request for Referral >> Apr 18, 2023 12:12 PM Tiffany B wrote: Did the patient discuss referral with their provider in the last year? Yes   Appointment offered? Yes, Caller states she does not want to wait until she sees PCP on 3/14 but would like to start the process now.   Type of order/referral and detailed reason for visit: Please place referral to [facility], [address] [place] [phone].   Caller would like to see Dr. Homero Fellers Aluisio and Dr. Durene Romans.   Caller would like a follow up call once the referral has been placed. The referral is for ongoing knee pain, patient states PCP is aware of knee pain.

## 2023-04-19 ENCOUNTER — Other Ambulatory Visit: Payer: Self-pay | Admitting: Family Medicine

## 2023-04-19 DIAGNOSIS — G8929 Other chronic pain: Secondary | ICD-10-CM

## 2023-04-19 NOTE — Telephone Encounter (Signed)
 Requested medication (s) are due for refill today- no  Requested medication (s) are on the active medication list -yes  Future visit scheduled -yes  Last refill: losartan 01/12/23 #45 3RF- request from different pharmacy, fails lab protocol-  over 1 year- 03/30/22, fails visit protocol                 Zopidem 02/01/23 #90-non delegated Rx  Notes to clinic: see above  Requested Prescriptions  Pending Prescriptions Disp Refills   losartan (COZAAR) 100 MG tablet 45 tablet 3    Sig: Take 0.5 tablets (50 mg total) by mouth daily.     Cardiovascular:  Angiotensin Receptor Blockers Failed - 04/19/2023 11:21 AM      Failed - Cr in normal range and within 180 days    Creat  Date Value Ref Range Status  03/30/2022 0.85 0.60 - 1.00 mg/dL Final         Failed - K in normal range and within 180 days    Potassium  Date Value Ref Range Status  03/30/2022 4.5 3.5 - 5.3 mmol/L Final         Failed - Valid encounter within last 6 months    Recent Outpatient Visits           1 year ago Allergic contact dermatitis due to plant   [facility] Donita Brooks, MD   2 years ago Chronic pain of right knee   [facility] Pickard, Priscille Heidelberg, MD   2 years ago General medical exam   [facility] Donita Brooks, MD   3 years ago Allergic contact dermatitis due to plant   [facility] Jenne Pane, Crystal A, FNP   3 years ago Depression, major, single episode, moderate (HCC)   [facility] Pickard, Priscille Heidelberg, MD              Passed - Patient is not pregnant      Passed - Last BP in normal range    BP Readings from Last 1 Encounters:  03/30/22 132/84          zolpidem (AMBIEN CR) 12.5 MG CR tablet 90 tablet 0    Sig: Take 1 tablet (12.5 mg total) by mouth at bedtime.     Not Delegated - Psychiatry:  Anxiolytics/Hypnotics Failed - 04/19/2023 11:21 AM      Failed - This refill cannot be delegated      Failed -  Urine Drug Screen completed in last 360 days      Failed - Valid encounter within last 6 months    Recent Outpatient Visits           1 year ago Allergic contact dermatitis due to plant   [facility] Pickard, Priscille Heidelberg, MD   2 years ago Chronic pain of right knee   [facility] Tanya Nones, Priscille Heidelberg, MD   2 years ago General medical exam   [facility] Donita Brooks, MD   3 years ago Allergic contact dermatitis due to plant   [facility] Lawson Fiscal A, FNP   3 years ago Depression, major, single episode, moderate (HCC)   [facility] Donita Brooks, MD                 Requested Prescriptions  Pending Prescriptions Disp Refills   losartan (COZAAR) 100 MG tablet 45 tablet 3  Sig: Take 0.5 tablets (50 mg total) by mouth daily.     Cardiovascular:  Angiotensin Receptor Blockers Failed - 04/19/2023 11:21 AM      Failed - Cr in normal range and within 180 days    Creat  Date Value Ref Range Status  03/30/2022 0.85 0.60 - 1.00 mg/dL Final         Failed - K in normal range and within 180 days    Potassium  Date Value Ref Range Status  03/30/2022 4.5 3.5 - 5.3 mmol/L Final         Failed - Valid encounter within last 6 months    Recent Outpatient Visits           1 year ago Allergic contact dermatitis due to plant   [facility] Donita Brooks, MD   2 years ago Chronic pain of right knee   [facility] Pickard, Priscille Heidelberg, MD   2 years ago General medical exam   [facility] Donita Brooks, MD   3 years ago Allergic contact dermatitis due to plant   [facility] Medicine Jenne Pane, Crystal A, FNP   3 years ago Depression, major, single episode, moderate (HCC)   [facility] Pickard, Priscille Heidelberg, MD              Passed - Patient is not pregnant      Passed - Last BP in normal range    BP Readings from  Last 1 Encounters:  03/30/22 132/84          zolpidem (AMBIEN CR) 12.5 MG CR tablet 90 tablet 0    Sig: Take 1 tablet (12.5 mg total) by mouth at bedtime.     Not Delegated - Psychiatry:  Anxiolytics/Hypnotics Failed - 04/19/2023 11:21 AM      Failed - This refill cannot be delegated      Failed - Urine Drug Screen completed in last 360 days      Failed - Valid encounter within last 6 months    Recent Outpatient Visits           1 year ago Allergic contact dermatitis due to plant   [facility] Pickard, Priscille Heidelberg, MD   2 years ago Chronic pain of right knee   [facility] Tanya Nones Priscille Heidelberg, MD   2 years ago General medical exam   [facility] Donita Brooks, MD   3 years ago Allergic contact dermatitis due to plant   [facility] Lawson Fiscal A, FNP   3 years ago Depression, major, single episode, moderate (HCC)   [facility] Pickard, Priscille Heidelberg, MD

## 2023-04-26 ENCOUNTER — Encounter (INDEPENDENT_AMBULATORY_CARE_PROVIDER_SITE_OTHER): Payer: Self-pay

## 2023-04-30 ENCOUNTER — Other Ambulatory Visit: Payer: Self-pay | Admitting: Family Medicine

## 2023-04-30 DIAGNOSIS — G4700 Insomnia, unspecified: Secondary | ICD-10-CM

## 2023-04-30 NOTE — Telephone Encounter (Unsigned)
 Copied from CRM [id number]. Topic: Clinical - Medication Refill >> Apr 30, 2023  3:05 PM Fuller Mandril wrote: Most Recent Primary Care Visit:  Provider: Lynnea Ferrier T  Department: [facility]  Visit Type: OFFICE VISIT  Date: 01/23/2023  Medication:  zolpidem (AMBIEN CR) 12.5 MG CR tablet losartan (COZAAR) 100 MG tablet  Has the patient contacted their pharmacy? Yes (Agent: If no, request that the patient contact the pharmacy for the refill. If patient does not wish to contact the pharmacy document the reason why and proceed with request.) (Agent: If yes, when and what did the pharmacy advise?) [facility]   Is this the correct pharmacy for this prescription? Yes If no, delete pharmacy and type the correct one.  This is the patient's preferred pharmacy:  [id number] [place], [place] - [address] [place] [address] Phone: [phone] Fax: [phone]   Has the prescription been filled recently? N/A  Is the patient out of the medication? Yes, out of one almost out of the both - going out of country next week   Has the patient been seen for an appointment in the last year OR does the patient have an upcoming appointment? Yes  Can we respond through MyChart? Yes  Agent: Please be advised that Rx refills may take up to 3 business days. We ask that you follow-up with your pharmacy.

## 2023-05-01 MED ORDER — ZOLPIDEM TARTRATE ER 12.5 MG PO TBCR: 12.5000 mg | EXTENDED_RELEASE_TABLET | Freq: Every day | ORAL | 0 refills | Status: DC

## 2023-05-01 MED ORDER — LOSARTAN POTASSIUM 100 MG PO TABS: 50.0000 mg | ORAL_TABLET | Freq: Every day | ORAL | 3 refills | Status: DC

## 2023-05-01 NOTE — Telephone Encounter (Signed)
 Requested medications are due for refill today.  No  - Pt wants meds sent to different pharmacy  Requested medications are on the active medications list.  yes  Last refill. Losartan refilled 01/12/2023 #45 3 rf,  Zolpidem has 2 rxs both from 02/01/2023 both for 90 0 rf  Future visit scheduled.   no  Notes to clinic.  Please review for refill.    Requested Prescriptions  Pending Prescriptions Disp Refills   losartan (COZAAR) 100 MG tablet 45 tablet 3    Sig: Take 0.5 tablets (50 mg total) by mouth daily.     Cardiovascular:  Angiotensin Receptor Blockers Failed - 05/01/2023  3:07 PM      Failed - Cr in normal range and within 180 days    Creat  Date Value Ref Range Status  03/30/2022 0.85 0.60 - 1.00 mg/dL Final         Failed - K in normal range and within 180 days    Potassium  Date Value Ref Range Status  03/30/2022 4.5 3.5 - 5.3 mmol/L Final         Failed - Valid encounter within last 6 months    Recent Outpatient Visits           1 year ago Allergic contact dermatitis due to plant   [facility] Donita Brooks, MD   2 years ago Chronic pain of right knee   [facility] Pickard, Priscille Heidelberg, MD   2 years ago General medical exam   [facility] Donita Brooks, MD   3 years ago Allergic contact dermatitis due to plant   [facility] Medicine Jenne Pane, Crystal A, FNP   3 years ago Depression, major, single episode, moderate (HCC)   [facility] Pickard, Priscille Heidelberg, MD              Passed - Patient is not pregnant      Passed - Last BP in normal range    BP Readings from Last 1 Encounters:  03/30/22 132/84          zolpidem (AMBIEN CR) 12.5 MG CR tablet 90 tablet 0    Sig: Take 1 tablet (12.5 mg total) by mouth at bedtime.     Not Delegated - Psychiatry:  Anxiolytics/Hypnotics Failed - 05/01/2023  3:07 PM      Failed - This refill cannot be delegated      Failed - Urine Drug Screen  completed in last 360 days      Failed - Valid encounter within last 6 months    Recent Outpatient Visits           1 year ago Allergic contact dermatitis due to plant   [facility] Pickard, Priscille Heidelberg, MD   2 years ago Chronic pain of right knee   [facility] Tanya Nones Priscille Heidelberg, MD   2 years ago General medical exam   [facility] Donita Brooks, MD   3 years ago Allergic contact dermatitis due to plant   [facility] Medicine Lawson Fiscal A, FNP   3 years ago Depression, major, single episode, moderate (HCC)   [facility] Pickard, Priscille Heidelberg, MD

## 2023-05-04 ENCOUNTER — Encounter: Payer: Self-pay | Admitting: Family Medicine

## 2023-05-04 ENCOUNTER — Ambulatory Visit: Payer: Medicare Other | Admitting: Family Medicine

## 2023-05-04 VITALS — BP 136/86 | HR 83 | Temp 98.3°F | Ht 64.0 in | Wt 153.6 lb

## 2023-05-04 DIAGNOSIS — M25561 Pain in right knee: Secondary | ICD-10-CM

## 2023-05-04 DIAGNOSIS — G8929 Other chronic pain: Secondary | ICD-10-CM

## 2023-05-04 NOTE — Progress Notes (Signed)
 Subjective:    Patient ID: Audrey David, female    DOB: 09/12/1945, 78 y.o.   MRN: [id number]  Knee Pain    Patient again presents with right-sided knee pain.  The pain is located diffuse around the joint.  She has a small effusion.  There is no erythema.  She is tender to palpation over the medial and lateral joint line.  It hurts to stand.  It hurts to bend.  It hurts to walk.  Patient is requesting a cortisone injection as she is leaving for a cruise in a week.  She has significant pain in her knee with walking. 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 Take with a full glass of water on an empty stomach. 4 tablet 11   ALPRAZolam (XANAX) 0.5 MG tablet Take 1 tablet (0.5 mg total) by mouth 3 (three) times daily as needed for anxiety. 30 tablet 0   APPLE CIDER VINEGAR PO Take 1 capsule by mouth daily.     Ascorbic Acid (VITAMIN C PO) Take 1 tablet by mouth daily.     Cholecalciferol (VITAMIN D3) 10 MCG (400 UNIT) CAPS TAKE 2 CAPSULES DAILY 180 capsule 3   loratadine (CLARITIN) 10 MG tablet Take 1 tablet (10 mg total) by mouth daily. For itching and swelling. 10 tablet 0   losartan (COZAAR) 100 MG tablet Take 0.5 tablets (50 mg total) by mouth daily. 45 tablet 3   naphazoline-pheniramine (NAPHCON-A) 0.025-0.3 % ophthalmic solution Place 1 drop into both eyes 4 (four) times daily as needed  for eye irritation. 15 mL 0   predniSONE (DELTASONE) 20 MG tablet 3 tabs poqday 1-2, 2 tabs poqday 3-4, 1 tab poqday 5-6 12 tablet 0   tiZANidine (ZANAFLEX) 4 MG tablet Take 1 tablet (4 mg total) by mouth 2 (two) times daily. 180 tablet 0   Turmeric 500 MG TABS Take 500 mg by mouth 2 (two) times daily.     VITAMIN E PO Take 1 tablet by mouth daily.     zinc gluconate 50 MG tablet Take 50 mg by mouth daily.     zolpidem (AMBIEN CR) 12.5 MG CR tablet Take 1 tablet (12.5 mg total) by mouth at bedtime. 90 tablet 0   zolpidem (AMBIEN CR) 12.5 MG CR tablet Take 1 tablet (12.5 mg total) by mouth at bedtime. 90 tablet 0   No current facility-administered medications on file prior to visit.   Allergies  Allergen Reactions   Ace Inhibitors Cough    Other reaction(s): Cough (ALLERGY/intolerance)   Codeine Nausea Only    60 years ago   Social History   Socioeconomic History   Marital status: Single    Spouse name: Not on file   Number of children: Not  on file   Years of education: Not on file   Highest education level: 12th grade  Occupational History   Not on file  Tobacco Use   Smoking status: Former   Smokeless tobacco: Never   Tobacco comments:     " quit smoking cigarettes in 2007 "  Substance and Sexual Activity   Alcohol use: Yes    Comment: Social    Drug use: No   Sexual activity: Not on file  Other Topics Concern   Not on file  Social History Narrative   Daily caffeine    Social Drivers of Health   Financial Resource Strain: Low Risk  (05/03/2023)   Overall Financial Resource Strain (CARDIA)    Difficulty of Paying Living Expenses: Not very hard  Food Insecurity: No Food Insecurity (05/03/2023)   Hunger Vital Sign    Worried About Running Out of Food in the Last Year: Never true    Ran Out of Food in the Last Year: Never true  Transportation Needs: No Transportation Needs (05/03/2023)   PRAPARE - Administrator, Civil Service (Medical): No    Lack of  Transportation (Non-Medical): No  Physical Activity: Unknown (05/03/2023)   Exercise Vital Sign    Days of Exercise per Week: Patient declined    Minutes of Exercise per Session: Not on file  Stress: No Stress Concern Present (05/03/2023)   [employer] of Occupational Health - Occupational Stress Questionnaire    Feeling of Stress : Only a little  Social Connections: Socially Isolated (05/03/2023)   Social Connection and Isolation Panel [NHANES]    Frequency of Communication with Friends and Family: More than three times a week    Frequency of Social Gatherings with Friends and Family: Three times a week    Attends Religious Services: Never    Active Member of Clubs or Organizations: No    Attends Banker Meetings: Not on file    Marital Status: Divorced  Intimate Partner Violence: Not At Risk (03/07/2022)   Humiliation, Afraid, Rape, and Kick questionnaire    Fear of Current or Ex-Partner: No    Emotionally Abused: No    Physically Abused: No    Sexually Abused: No     Review of Systems  All other systems reviewed and are negative.      Objective:   Physical Exam Vitals reviewed.  Constitutional:      Appearance: Normal appearance.  Cardiovascular:     Rate and Rhythm: Normal rate and regular rhythm.     Pulses: Normal pulses.     Heart sounds: Normal heart sounds.  Pulmonary:     Effort: Pulmonary effort is normal.     Breath sounds: Normal breath sounds.  Musculoskeletal:     Right knee: No effusion. Decreased range of motion. Tenderness present over the medial joint line and lateral joint line.  Neurological:     Mental Status: She is alert.           Assessment & Plan:  Chronic pain of right knee I have referred the patient to an orthopedist and she sees them later this spring to discuss knee replacement.  Meanwhile she is requesting a cortisone injection for pain relief.   Using sterile technique, I injected the knee with 2 cc of lidocaine,  2 cc of Marcaine, and 2 cc of 40 mg/mL Kenalog.  The patient tolerated the procedure well without complication.  Proceed with an x-ray of the knee to evaluate further.

## 2023-05-17 MED ORDER — TRIAMCINOLONE ACETONIDE 40 MG/ML IJ SUSP
80.0000 mg | Freq: Once | INTRAMUSCULAR | Status: AC
  Administered 2023-05-04: 80 mg via INTRA_ARTICULAR

## 2023-05-17 NOTE — Addendum Note (Signed)
 Addended by: Darral Dash on: 05/17/2023 04:47 PM   Modules accepted: Orders

## 2023-06-18 ENCOUNTER — Other Ambulatory Visit: Payer: Self-pay | Admitting: Family Medicine

## 2023-06-18 NOTE — Telephone Encounter (Signed)
 Copied from CRM [id number]. Topic: Clinical - Medication Refill >> Jun 18, 2023 10:48 AM Oddis Bench wrote: Most Recent Primary Care Visit:  Provider: Eliane Grooms T  Department: [facility]  Visit Type: OFFICE VISIT  Date: 05/04/2023  Medication: tiZANidine  (ZANAFLEX ) 4 MG tablet  Has the patient contacted their pharmacy? No (Agent: If no, request that the patient contact the pharmacy for the refill. If patient does not wish to contact the pharmacy document the reason why and proceed with request.) (Agent: If yes, when and what did the pharmacy advise?)  Is this the correct pharmacy for this prescription? Yes If no, delete pharmacy and type the correct one.  This is the patient's preferred pharmacy:  [id number], [place] - [address] [address] [place] [address] Phone: [phone] Fax: [phone]   Has the prescription been filled recently? No  Is the patient out of the medication? No  Has the patient been seen for an appointment in the last year OR does the patient have an upcoming appointment? Yes  Can we respond through MyChart? No  Agent: Please be advised that Rx refills may take up to 3 business days. We ask that you follow-up with your pharmacy.

## 2023-06-20 NOTE — Telephone Encounter (Signed)
 Requested medication (s) are due for refill today - unsure  Requested medication (s) are on the active medication list -yes  Future visit scheduled -no  Last refill: 11/29/22 #180  Notes to clinic: non delegated Rx  Requested Prescriptions  Pending Prescriptions Disp Refills   tiZANidine  (ZANAFLEX ) 4 MG tablet 180 tablet 0    Sig: Take 1 tablet (4 mg total) by mouth 2 (two) times daily.     Not Delegated - Cardiovascular:  Alpha-2 Agonists - tizanidine  Failed - 06/20/2023 12:34 PM      Failed - This refill cannot be delegated      Passed - Valid encounter within last 6 months    Recent Outpatient Visits           1 month ago Chronic pain of right knee   [place] [facility] Austine Lefort, MD   4 months ago Chronic pain of right knee   [place] [facility] Cheril Cork, Cisco Crest, MD   1 year ago General medical exam   [place] [facility] Austine Lefort, MD                 Requested Prescriptions  Pending Prescriptions Disp Refills   tiZANidine  (ZANAFLEX ) 4 MG tablet 180 tablet 0    Sig: Take 1 tablet (4 mg total) by mouth 2 (two) times daily.     Not Delegated - Cardiovascular:  Alpha-2 Agonists - tizanidine  Failed - 06/20/2023 12:34 PM      Failed - This refill cannot be delegated      Passed - Valid encounter within last 6 months    Recent Outpatient Visits           1 month ago Chronic pain of right knee   [place] [facility] Austine Lefort, MD   4 months ago Chronic pain of right knee   [place] [facility] Austine Lefort, MD   1 year ago General medical exam   [place] [facility] Pickard, Cisco Crest, MD

## 2023-06-21 MED ORDER — TIZANIDINE HCL 4 MG PO TABS: 4.0000 mg | ORAL_TABLET | Freq: Two times a day (BID) | ORAL | 0 refills | Status: DC

## 2023-07-25 ENCOUNTER — Other Ambulatory Visit: Payer: Self-pay

## 2023-07-25 DIAGNOSIS — I10 Essential (primary) hypertension: Secondary | ICD-10-CM

## 2023-07-25 MED ORDER — LOSARTAN POTASSIUM 50 MG PO TABS: 50.0000 mg | ORAL_TABLET | Freq: Every day | ORAL | 1 refills | Status: DC

## 2023-07-27 ENCOUNTER — Other Ambulatory Visit: Payer: Self-pay | Admitting: Family Medicine

## 2023-07-27 DIAGNOSIS — G4700 Insomnia, unspecified: Secondary | ICD-10-CM

## 2023-07-27 NOTE — Telephone Encounter (Signed)
 Copied from CRM [id number]. Topic: Clinical - Medication Refill >> Jul 27, 2023  5:46 PM DeAngela L wrote: Medication: zolpidem  (AMBIEN  CR) 12.5 MG CR tablet   Has the patient contacted their pharmacy? No   (Agent: If no, request that the patient contact the pharmacy for the refill. If patient does not wish to contact the pharmacy document the reason why and proceed with request.) (Agent: If yes, when and what did the pharmacy advise?)  This is the patient's preferred pharmacy:  [id number], [place] - [address] [facility] [place] [address] Phone: [phone] Fax: [phone]  Is this the correct pharmacy for this prescription? Yes  If no, delete pharmacy and type the correct one.   Has the prescription been filled recently? Yes   Is the patient out of the medication? Yes   Has the patient been seen for an appointment in the last year OR does the patient have an upcoming appointment? Yes   Can we respond through MyChart? No   Agent: Please be advised that Rx refills may take up to 3 business days. We ask that you follow-up with your pharmacy.

## 2023-07-30 ENCOUNTER — Other Ambulatory Visit: Payer: Self-pay | Admitting: Family Medicine

## 2023-07-30 DIAGNOSIS — G4700 Insomnia, unspecified: Secondary | ICD-10-CM

## 2023-07-30 NOTE — Telephone Encounter (Signed)
 Requested medication (s) are due for refill today: yes  Requested medication (s) are on the active medication list: yes  Last refill:  05/01/23  Future visit scheduled: no  Notes to clinic:  Unable to refill per protocol, cannot delegate.      Requested Prescriptions  Pending Prescriptions Disp Refills   zolpidem  (AMBIEN  CR) 12.5 MG CR tablet 90 tablet 0    Sig: Take 1 tablet (12.5 mg total) by mouth at bedtime.     Not Delegated - Psychiatry:  Anxiolytics/Hypnotics Failed - 07/30/2023  1:26 PM      Failed - This refill cannot be delegated      Failed - Urine Drug Screen completed in last 360 days      Failed - Valid encounter within last 6 months    Recent Outpatient Visits           2 months ago Chronic pain of right knee   [place] [facility] Austine Lefort, MD   6 months ago Chronic pain of right knee   [place] [facility] Austine Lefort, MD   1 year ago General medical exam   [place] [facility] Pickard, Cisco Crest, MD

## 2023-07-31 MED ORDER — ZOLPIDEM TARTRATE ER 12.5 MG PO TBCR: 12.5000 mg | EXTENDED_RELEASE_TABLET | Freq: Every day | ORAL | 0 refills | Status: DC

## 2023-08-02 ENCOUNTER — Other Ambulatory Visit: Payer: Self-pay | Admitting: Family Medicine

## 2023-08-06 ENCOUNTER — Telehealth: Payer: Self-pay

## 2023-08-06 NOTE — Telephone Encounter (Signed)
 Copied from CRM [id number]. Topic: Clinical - Prescription Issue >> Aug 06, 2023  2:05 PM Stanly Early wrote: Reason for CRM: zolpidem  (AMBIEN  CR) 12.5 MG CR tablet [[id number]], pharmacy stated provider need to call the insurance or the pharmacy to okay the medication. They can't fill the medication until the insurance clears it.   [id number] [facility], [place] - [address] [facility] [address] [place] [address] Phone: [phone] Fax: [phone]

## 2023-08-07 ENCOUNTER — Other Ambulatory Visit (HOSPITAL_COMMUNITY): Payer: Self-pay

## 2023-08-07 ENCOUNTER — Telehealth: Payer: Self-pay

## 2023-08-07 NOTE — Telephone Encounter (Signed)
 Pharmacy Patient Advocate Encounter   Received notification from CoverMyMeds that prior authorization for Ambien  10MG  tablets  is required/requested.   Insurance verification completed.   The patient is insured through [facility] .   Per test claim: PA required; PA started via CoverMyMeds. KEY [id number] . Waiting for clinical questions to populate.

## 2023-08-08 NOTE — Telephone Encounter (Signed)
 A user error has taken place: orders placed in error, not carried out on this patient.

## 2023-08-09 ENCOUNTER — Other Ambulatory Visit: Payer: Self-pay | Admitting: Family Medicine

## 2023-08-09 MED ORDER — ZOLPIDEM TARTRATE 10 MG PO TABS: 10.0000 mg | ORAL_TABLET | Freq: Every evening | ORAL | 1 refills | Status: DC | PRN

## 2023-08-10 ENCOUNTER — Telehealth: Payer: Self-pay

## 2023-08-10 ENCOUNTER — Other Ambulatory Visit: Payer: Self-pay | Admitting: Family Medicine

## 2023-08-10 ENCOUNTER — Other Ambulatory Visit (HOSPITAL_COMMUNITY): Payer: Self-pay

## 2023-08-10 MED ORDER — ZOLPIDEM TARTRATE 10 MG PO TABS: 10.0000 mg | ORAL_TABLET | Freq: Every evening | ORAL | 1 refills | Status: DC | PRN

## 2023-08-10 NOTE — Telephone Encounter (Signed)
 Pharmacy Patient Advocate Encounter  Received notification from [employer] that Prior Authorization for Ambien  10MG  tablets has been APPROVED Unable to obtain price due to refill too soon rejection, last fill date 08/09/2023 next available fill date 09/02/2023   PA #/Case ID/Reference #: [id number]

## 2023-08-10 NOTE — Telephone Encounter (Signed)
 Copied from CRM [id number]. Topic: Clinical - Prescription Issue >> Aug 06, 2023  2:05 PM Stanly Early wrote: Reason for CRM: zolpidem  (AMBIEN  CR) 12.5 MG CR tablet [[id number]], pharmacy stated provider need to call the insurance or the pharmacy to okay the medication. They can't fill the medication until the insurance clears it.   [id number] [place] [facility], [place] - [address] [address] [place] [address] Phone: [phone] Fax: [phone] >> Aug 10, 2023  3:15 PM Emylou G wrote: Patient called - Wants a 90 day supply because going out of town.. only rcvd 30 day supply?  Also will she go back to 12.5 mg?  Pls call her for clarity on strength.

## 2023-08-15 ENCOUNTER — Other Ambulatory Visit (HOSPITAL_COMMUNITY): Payer: Self-pay

## 2023-08-23 ENCOUNTER — Telehealth: Payer: Self-pay | Admitting: Family Medicine

## 2023-08-23 ENCOUNTER — Other Ambulatory Visit: Payer: Self-pay | Admitting: Family Medicine

## 2023-08-23 NOTE — Telephone Encounter (Signed)
 Patient declined speaking with NT. Routing to office for follow-up.  Copied from CRM [id number]. Topic: Clinical - Medication Question >> Aug 23, 2023 10:53 AM Avram MATSU wrote: Reason for CRM: patient is itching due to poison ivy and stated she gets prescribed predniSONE  (DELTASONE ) 20 MG tablet [[id number]] every year and would like to have the rx sent in today. Patient denied talking to NT.    ----------------------------------------------------------------------- From previous Reason for Contact - Medication Refill: Medication: predniSONE  (DELTASONE ) 20 MG tablet [[id number]]  Has the patient contacted their pharmacy? Yes (Agent: If no, request that the patient contact the pharmacy for the refill. If patient does not wish to contact the pharmacy document the reason why and proceed with request.) (Agent: If yes, when and what did the pharmacy advise?)  This is the patient's preferred pharmacy:  [id number] [place], [place] - [address] [facility] [address] [place] [address] Phone: [phone] Fax: [phone]  Is this the correct pharmacy for this prescription? Yes If no, delete pharmacy and type the correct one.   Has the prescription been filled recently?    Is the patient out of the medication?    Has the patient been seen for an appointment in the last year OR does the patient have an upcoming appointment?    Can we respond through MyChart?    Agent: Please be advised that Rx refills may take up to 3 business days. We ask that you follow-up with your pharmacy.

## 2023-09-25 ENCOUNTER — Telehealth: Payer: Self-pay | Admitting: Family Medicine

## 2023-09-25 NOTE — Telephone Encounter (Addendum)
 Received pre-op clearance forms on 09/19/23 from [facility] requiring completion and signature by provider. No charge.  Forms completed, signed and successfully faxed back to [facility] along with chart notes. Received confirmation time stamp of Aug/05/2023 5:15:35 PM   Original paperwork and confirmation sheet sent to scan center.

## 2023-10-02 ENCOUNTER — Telehealth: Payer: Self-pay | Admitting: Pharmacy Technician

## 2023-10-02 ENCOUNTER — Other Ambulatory Visit (HOSPITAL_COMMUNITY): Payer: Self-pay

## 2023-10-02 ENCOUNTER — Telehealth: Payer: Self-pay

## 2023-10-02 NOTE — Telephone Encounter (Signed)
 Copied from CRM [id number]. Topic: Clinical - Medication Prior Auth >> Oct 02, 2023  1:56 PM Precious C wrote: Reason for CRM: Mia from [facility] called regarding the Zolpidem  (Ambien ) prescription. She stated the medication requires a prior authorization and requests a follow-up call at [phone].

## 2023-10-03 ENCOUNTER — Other Ambulatory Visit: Payer: Self-pay | Admitting: Family Medicine

## 2023-10-03 ENCOUNTER — Other Ambulatory Visit (HOSPITAL_COMMUNITY): Payer: Self-pay

## 2023-10-03 NOTE — Telephone Encounter (Signed)
 Copied from CRM #[id number]. Topic: Clinical - Medication Refill >> Oct 03, 2023  2:32 PM Tiffini S wrote: Medication: zolpidem  (AMBIEN ) 10 MG tablet, patient will be on vacation for 10/13/23 to November- will be traveling all over the [place] .   Will need a 90 day supply for refill - please do not refill 30 day   Has the patient contacted their pharmacy? Yes (Agent: If no, request that the patient contact the pharmacy for the refill. If patient does not wish to contact the pharmacy document the reason why and proceed with request.) (Agent: If yes, when and what did the pharmacy advise?)  This is the patient's preferred pharmacy:  [id number] [place], [place] - [address] [facility] [address] [facility] [address] [place] [address] Phone: [phone] Fax: [phone]  Is this the correct pharmacy for this prescription? Yes If no, delete pharmacy and type the correct one.   Has the prescription been filled recently? Yes  Is the patient out of the medication? Yes, have enough to 10/06/23  Has the patient been seen for an appointment in the last year OR does the patient have an upcoming appointment? Yes  Can we respond through MyChart? No, please call at [phone]  Agent: Please be advised that Rx refills may take up to 3 business days. We ask that you follow-up with your pharmacy.

## 2023-10-03 NOTE — Telephone Encounter (Signed)
 Just spoke with Norleen at [employer] and he stated someone sent in a appeal on 10/02/2023 and it is still under review he did need to if the patient has tried and failed doxepin 3mg  or 6mg  in the past.  But the PA is pending and he stated they should have an answer in 48 hrs.   Case # [id number]

## 2023-10-05 ENCOUNTER — Telehealth: Payer: Self-pay

## 2023-10-05 ENCOUNTER — Other Ambulatory Visit: Payer: Self-pay | Admitting: Family Medicine

## 2023-10-05 MED ORDER — ZOLPIDEM TARTRATE 10 MG PO TABS: 10.0000 mg | ORAL_TABLET | Freq: Every evening | ORAL | 1 refills | Status: AC | PRN

## 2023-10-05 NOTE — Telephone Encounter (Signed)
 Prescription Request  10/05/2023  LOV: 05/04/2023  What is the name of the medication or equipment?   tiZANidine  (ZANAFLEX ) 4 MG tablet  **90 day script request**  Have you contacted your pharmacy to request a refill? Yes   Which pharmacy would you like this sent to?  [id number] [place], [place] - [address] [facility] [address] [place] [address] Phone: [phone] Fax: [phone]    Patient notified that their request is being sent to the clinical staff for review and that they should receive a response within 2 business days.   Please advise pharmacist.

## 2023-10-05 NOTE — Telephone Encounter (Signed)
 Requested medications are due for refill today.  no  Requested medications are on the active medications list.  yes  Last refill. 08/10/2023 #90 1 rf  Future visit scheduled.   yes  Notes to clinic.  >> Oct 03, 2023  2:32 PM Tiffini S wrote: Medication: zolpidem  (AMBIEN ) 10 MG tablet, patient will be on vacation for 10/13/23 to November- will be traveling all over the [place] .    Will need a 90 day supply for refill - please do not refill 30 day     Requested Prescriptions  Pending Prescriptions Disp Refills   zolpidem  (AMBIEN ) 10 MG tablet 90 tablet 1    Sig: Take 1 tablet (10 mg total) by mouth at bedtime as needed for sleep.     Not Delegated - Psychiatry:  Anxiolytics/Hypnotics Failed - 10/05/2023  3:44 PM      Failed - This refill cannot be delegated      Failed - Urine Drug Screen completed in last 360 days      Passed - Valid encounter within last 6 months    Recent Outpatient Visits           5 months ago Chronic pain of right knee   [place] [facility] Duanne Butler DASEN, MD   8 months ago Chronic pain of right knee   [place] [facility] Duanne Butler DASEN, MD   1 year ago General medical exam   [place] [facility] Pickard, Butler DASEN, MD

## 2023-10-05 NOTE — Telephone Encounter (Signed)
 Copied from CRM [id number]. Topic: Clinical - Prescription Issue >> Oct 05, 2023  3:17 PM Sasha H wrote: Reason for CRM: Pt states she needs her zolpidem  (AMBIEN ) 10 MG tablet called in for 90 days. She states this number needs to be called [phone] for it to be filled , [facility]

## 2023-10-08 ENCOUNTER — Other Ambulatory Visit (HOSPITAL_COMMUNITY): Payer: Self-pay

## 2023-10-08 ENCOUNTER — Other Ambulatory Visit: Payer: Self-pay | Admitting: Family Medicine

## 2023-10-08 DIAGNOSIS — Z1231 Encounter for screening mammogram for malignant neoplasm of breast: Secondary | ICD-10-CM

## 2023-10-09 ENCOUNTER — Telehealth: Payer: Self-pay

## 2023-10-09 NOTE — Telephone Encounter (Signed)
 Can a prior authorization be done for this patient's Ambien ? [facility] that the Maximum supply is 90 day supply in 365 days. Can this be appealed? Thank you!

## 2023-10-09 NOTE — Telephone Encounter (Signed)
 Requested medication (s) are due for refill today: yes  Requested medication (s) are on the active medication list: yes  Last refill:  08/02/23  Future visit scheduled: {Yes  Notes to clinic:  Unable to refill per protocol, cannot delegate.      Requested Prescriptions  Pending Prescriptions Disp Refills   tiZANidine  (ZANAFLEX ) 4 MG tablet 180 tablet 0    Sig: Take 1 tablet (4 mg total) by mouth 2 (two) times daily.     Not Delegated - Cardiovascular:  Alpha-2 Agonists - tizanidine  Failed - 10/09/2023 10:20 AM      Failed - This refill cannot be delegated      Passed - Valid encounter within last 6 months    Recent Outpatient Visits           5 months ago Chronic pain of right knee   [place] [facility] Duanne Butler DASEN, MD   8 months ago Chronic pain of right knee   [place] [facility] Duanne Butler DASEN, MD   1 year ago General medical exam   [place] [facility] Pickard, Butler DASEN, MD

## 2023-10-10 ENCOUNTER — Other Ambulatory Visit: Payer: Self-pay | Admitting: Family Medicine

## 2023-10-10 ENCOUNTER — Encounter: Payer: Self-pay | Admitting: Family Medicine

## 2023-10-10 ENCOUNTER — Other Ambulatory Visit (HOSPITAL_COMMUNITY): Payer: Self-pay

## 2023-10-10 NOTE — Telephone Encounter (Signed)
 Prescription Request  10/10/2023  LOV: 05/04/2023  What is the name of the medication or equipment?   tiZANidine  (ZANAFLEX ) 4 MG tablet  **90 day script requested**  Have you contacted your pharmacy to request a refill? Yes   Which pharmacy would you like this sent to?  [id number] [place], [place] - [address] [facility] [address] [place] [address] Phone: [phone] Fax: [phone]    Patient notified that their request is being sent to the clinical staff for review and that they should receive a response within 2 business days.   Please advise pharmacist.

## 2023-10-10 NOTE — Telephone Encounter (Signed)
 Hi Kiana,   I am going to see if our upfront staff can scan it into the Media for you. Thanks so much for your help!  Ronal Bradley

## 2023-10-10 NOTE — Telephone Encounter (Signed)
 Good morning Ronal Bradley, can you upload a copy of that letter to her media tab so that we can take a look at it.  I believe that was the appeal determination letter, when I spoke with her insurance last week they stated an appeal had been sent in and was still pending.

## 2023-10-11 ENCOUNTER — Other Ambulatory Visit (HOSPITAL_COMMUNITY): Payer: Self-pay

## 2023-10-11 NOTE — Telephone Encounter (Signed)
 Requested medications are due for refill today.  no  Requested medications are on the active medications list.  yes  Last refill. 08/02/2023 #180 0 rf  Future visit scheduled.   yes  Notes to clinic.  Refill/refusal not delegated.    Requested Prescriptions  Pending Prescriptions Disp Refills   tiZANidine  (ZANAFLEX ) 4 MG tablet 180 tablet 0    Sig: Take 1 tablet (4 mg total) by mouth 2 (two) times daily.     Not Delegated - Cardiovascular:  Alpha-2 Agonists - tizanidine  Failed - 10/11/2023  3:46 PM      Failed - This refill cannot be delegated      Passed - Valid encounter within last 6 months    Recent Outpatient Visits           5 months ago Chronic pain of right knee   [place] [facility] Duanne Butler DASEN, MD   8 months ago Chronic pain of right knee   [place] Duanne Butler DASEN, MD   1 year ago General medical exam   [place] [facility] Pickard, Butler DASEN, MD

## 2023-10-11 NOTE — Telephone Encounter (Signed)
 Good morning, that was just the rejection reason, it looks like the appeal is still under review

## 2023-10-16 ENCOUNTER — Other Ambulatory Visit: Payer: Self-pay

## 2023-10-16 MED ORDER — TIZANIDINE HCL 4 MG PO TABS: 4.0000 mg | ORAL_TABLET | Freq: Two times a day (BID) | ORAL | 0 refills | Status: AC

## 2023-12-03 ENCOUNTER — Encounter: Payer: Self-pay | Admitting: Family Medicine

## 2023-12-03 ENCOUNTER — Ambulatory Visit: Admitting: Family Medicine

## 2023-12-03 ENCOUNTER — Ambulatory Visit

## 2023-12-03 VITALS — BP 138/72 | HR 82 | Temp 98.0°F | Ht 64.0 in | Wt 150.8 lb

## 2023-12-03 DIAGNOSIS — I10 Essential (primary) hypertension: Secondary | ICD-10-CM

## 2023-12-03 NOTE — Progress Notes (Signed)
 Subjective:    Patient ID: Audrey David, female    DOB: 1945/06/08, 78 y.o.   MRN: [id number]  Knee Pain   Patient is a 78 year old Caucasian female who presents with chronic pain in her right knee.  She is planning to have knee replacement.  She is here for preoperative clearance.  She denies any chest pain.  She denies any shortness of breath.  She denies any dyspnea on exertion.  Her blood pressure here is borderline elevated.  She has not been monitoring her blood pressure at home however she states that she has been taking losartan  consistently.  She is not taking Fosamax .  She finds it difficult to remember to take that.  She has not had any recent lab work.  She is due for flu shot. 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 30 tablet 0   APPLE CIDER VINEGAR PO Take 1 capsule by mouth daily.     Ascorbic Acid (VITAMIN C PO) Take 1 tablet by mouth daily.     Cholecalciferol  (VITAMIN D3) 10 MCG (400 UNIT) CAPS TAKE 2 CAPSULES DAILY 180 capsule 3   loratadine  (CLARITIN ) 10 MG tablet Take 1 tablet (10 mg total) by mouth daily. For itching and swelling. 10 tablet 0   losartan  (COZAAR ) 50 MG tablet Take 1 tablet (50 mg total) by mouth daily. 90 tablet 1   naphazoline-pheniramine (NAPHCON-A) 0.025-0.3 % ophthalmic solution Place 1 drop into both eyes 4 (four) times  daily as needed for eye irritation. 15 mL 0   tiZANidine  (ZANAFLEX ) 4 MG tablet Take 1 tablet (4 mg total) by mouth 2 (two) times daily. 180 tablet 0   Turmeric 500 MG TABS Take 500 mg by mouth 2 (two) times daily.     VITAMIN E PO Take 1 tablet by mouth daily.     zinc gluconate 50 MG tablet Take 50 mg by mouth daily.     zolpidem  (AMBIEN ) 10 MG tablet Take 1 tablet (10 mg total) by mouth at bedtime as needed for sleep. 90 tablet 1   No current facility-administered medications on file prior to visit.   Allergies  Allergen Reactions   Ace Inhibitors Cough    Other reaction(s): Cough (ALLERGY/intolerance)   Social History   Socioeconomic History   Marital status: Single    Spouse name: Not on file   Number of children: Not on file   Years of education: Not on file   Highest education level: 12th grade  Occupational History   Not on file  Tobacco Use   Smoking status: Former   Smokeless tobacco: Never   Tobacco comments:      quit smoking cigarettes in 2007   Substance and Sexual Activity  Alcohol use: Yes    Comment: Social    Drug use: No   Sexual activity: Not on file  Other Topics Concern   Not on file  Social History Narrative   Daily caffeine    Social Drivers of Health   Financial Resource Strain: Low Risk  (05/03/2023)   Overall Financial Resource Strain (CARDIA)    Difficulty of Paying Living Expenses: Not very hard  Food Insecurity: No Food Insecurity (05/03/2023)   Hunger Vital Sign    Worried About Running Out of Food in the Last Year: Never true    Ran Out of Food in the Last Year: Never true  Transportation Needs: No Transportation Needs (05/03/2023)   PRAPARE - Administrator, Civil Service (Medical): No    Lack of Transportation (Non-Medical): No  Physical Activity: Unknown (05/03/2023)   Exercise Vital Sign    Days of Exercise per Week: Patient declined    Minutes of Exercise per Session: Not on file  Stress: No Stress Concern Present  (05/03/2023)   [employer] of Occupational Health - Occupational Stress Questionnaire    Feeling of Stress : Only a little  Social Connections: Socially Isolated (05/03/2023)   Social Connection and Isolation Panel    Frequency of Communication with Friends and Family: More than three times a week    Frequency of Social Gatherings with Friends and Family: Three times a week    Attends Religious Services: Never    Active Member of Clubs or Organizations: No    Attends Banker Meetings: Not on file    Marital Status: Divorced  Intimate Partner Violence: Not At Risk (03/07/2022)   Humiliation, Afraid, Rape, and Kick questionnaire    Fear of Current or Ex-Partner: No    Emotionally Abused: No    Physically Abused: No    Sexually Abused: No     Review of Systems  All other systems reviewed and are negative.      Objective:   Physical Exam Vitals reviewed.  Constitutional:      Appearance: Normal appearance.  Cardiovascular:     Rate and Rhythm: Normal rate and regular rhythm.     Pulses: Normal pulses.     Heart sounds: Normal heart sounds.  Pulmonary:     Effort: Pulmonary effort is normal.     Breath sounds: Normal breath sounds.  Musculoskeletal:     Right knee: No effusion. Decreased range of motion. Tenderness present over the medial joint line and lateral joint line.  Neurological:     Mental Status: She is alert.           Assessment & Plan:  Benign essential HTN - Plan: CBC with Differential/Platelet, Comprehensive metabolic panel with GFR Patient's blood pressure today is acceptable.  His borderline high.  I recommended that she monitor her blood pressure at home and email me the values in 1 week.  I would like to ensure that her blood pressure is less than 140/90.  She denies any symptoms of unstable angina or congestive heart failure.  Therefore I see no medical contraindication to surgery assuming that her CBC is normal and that there is no  significant anemia.  I will also check a CMP to evaluate for any electrolyte disturbances or renal dysfunction.  Offered the patient a flu shot which she deferred today.

## 2023-12-04 ENCOUNTER — Other Ambulatory Visit

## 2023-12-04 ENCOUNTER — Ambulatory Visit
Admission: RE | Admit: 2023-12-04 | Discharge: 2023-12-04 | Disposition: A | Discharge status: Home / Self Care | Source: Ambulatory Visit | Attending: Family Medicine | Admitting: Family Medicine

## 2023-12-04 ENCOUNTER — Other Ambulatory Visit: Payer: Self-pay

## 2023-12-04 ENCOUNTER — Ambulatory Visit: Payer: Self-pay | Admitting: Family Medicine

## 2023-12-04 DIAGNOSIS — Z1231 Encounter for screening mammogram for malignant neoplasm of breast: Secondary | ICD-10-CM

## 2023-12-04 DIAGNOSIS — N289 Disorder of kidney and ureter, unspecified: Secondary | ICD-10-CM

## 2023-12-04 DIAGNOSIS — I10 Essential (primary) hypertension: Secondary | ICD-10-CM

## 2023-12-04 DIAGNOSIS — R944 Abnormal results of kidney function studies: Secondary | ICD-10-CM

## 2023-12-04 LAB — COMPREHENSIVE METABOLIC PANEL WITH GFR
AG Ratio: 2.2 (calc) (ref 1.0–2.5)
ALT: 19 U/L (ref 6–29)
AST: 15 U/L (ref 10–35)
Albumin: 4.7 g/dL (ref 3.6–5.1)
Alkaline phosphatase (APISO): 61 U/L (ref 37–153)
BUN/Creatinine Ratio: 35 (calc) — ABNORMAL HIGH (ref 6–22)
BUN: 55 mg/dL — ABNORMAL HIGH (ref 7–25)
CO2: 28 mmol/L (ref 20–32)
Calcium: 9.7 mg/dL (ref 8.6–10.4)
Chloride: 103 mmol/L (ref 98–110)
Creat: 1.57 mg/dL — ABNORMAL HIGH (ref 0.60–1.00)
Globulin: 2.1 g/dL (ref 1.9–3.7)
Glucose, Bld: 102 mg/dL — ABNORMAL HIGH (ref 65–99)
Potassium: 5.2 mmol/L (ref 3.5–5.3)
Sodium: 137 mmol/L (ref 135–146)
Total Bilirubin: 0.5 mg/dL (ref 0.2–1.2)
Total Protein: 6.8 g/dL (ref 6.1–8.1)
eGFR: 33 mL/min/1.73m2 — ABNORMAL LOW (ref >=60)

## 2023-12-04 LAB — CBC WITH DIFFERENTIAL/PLATELET
Absolute Lymphocytes: 1574 {cells}/uL (ref 850–3900)
Absolute Monocytes: 689 {cells}/uL (ref 200–950)
Basophils Absolute: 49 {cells}/uL (ref 0–200)
Basophils Relative: 0.8 %
Eosinophils Absolute: 409 {cells}/uL (ref 15–500)
Eosinophils Relative: 6.7 %
HCT: 36.4 % (ref 35.0–45.0)
Hemoglobin: 11.7 g/dL (ref 11.7–15.5)
MCH: 28.5 pg (ref 27.0–33.0)
MCHC: 32.1 g/dL (ref 32.0–36.0)
MCV: 88.6 fL (ref 80.0–100.0)
MPV: 11.4 fL (ref 7.5–12.5)
Monocytes Relative: 11.3 %
Neutro Abs: 3379 {cells}/uL (ref 1500–7800)
Neutrophils Relative %: 55.4 %
Platelets: 270 Thousand/uL (ref 140–400)
RBC: 4.11 Million/uL (ref 3.80–5.10)
RDW: 13.2 % (ref 11.0–15.0)
Total Lymphocyte: 25.8 %
WBC: 6.1 Thousand/uL (ref 3.8–10.8)

## 2023-12-05 ENCOUNTER — Ambulatory Visit
Admission: RE | Admit: 2023-12-05 | Discharge: 2023-12-05 | Disposition: A | Discharge status: Home / Self Care | Source: Ambulatory Visit | Attending: Family Medicine | Admitting: Family Medicine

## 2023-12-05 ENCOUNTER — Ambulatory Visit: Payer: Self-pay

## 2023-12-05 DIAGNOSIS — N289 Disorder of kidney and ureter, unspecified: Secondary | ICD-10-CM

## 2023-12-05 LAB — PROTEIN / CREATININE RATIO, URINE
Creatinine, Urine: 65 mg/dL (ref 20–275)
Protein/Creat Ratio: 123 mg/g{creat} (ref 24–184)
Protein/Creatinine Ratio: 0.123 mg/mg{creat} (ref 0.024–0.184)
Total Protein, Urine: 8 mg/dL (ref 5–24)

## 2023-12-05 NOTE — Telephone Encounter (Signed)
 Copied from CRM [id number]. Topic: Clinical - Red Word Triage >> Dec 05, 2023  2:27 PM Lonell PEDLAR wrote: Red Word that prompted transfer to Nurse Triage: Patient with worsening low back pain, seen Monday  and provided urine sample Answer Assessment - Initial Assessment Questions Patient declined triage, nurse did not triage. Warm transfer call to [facility], spoke with Nanette.         Pt reports: spoke to staff yesterday regarding labs, told to stop taking any tylenol , aleve , due to kidneys. Having severe pain in back; unsure if have kidney stone, need pain medication.  Answer Assessment - Initial Assessment Questions 1. REASON FOR CALL: What is the main reason for your call? or How can I best help you?     Patient declined nurse triage. Nurse transferred call to [facility]. Patient LOV 12/03/23.  Protocols used: Back Pain-A-AH, Information Only Call - No Triage-A-AH

## 2023-12-05 NOTE — Telephone Encounter (Signed)
 Spoke with pt and advised her that an order for a renal ultrasound has been placed. I also informed her that Dr Duanne is out of the office today and will review her urine results when he returns.

## 2023-12-06 ENCOUNTER — Ambulatory Visit: Payer: Self-pay | Admitting: Family Medicine

## 2023-12-10 ENCOUNTER — Ambulatory Visit: Payer: Self-pay | Admitting: Family Medicine

## 2023-12-11 ENCOUNTER — Ambulatory Visit: Admitting: Family Medicine

## 2023-12-11 ENCOUNTER — Encounter: Payer: Self-pay | Admitting: Family Medicine

## 2023-12-11 VITALS — BP 142/73 | HR 87 | Temp 97.6°F | Ht 64.0 in | Wt 150.0 lb

## 2023-12-11 DIAGNOSIS — M5441 Lumbago with sciatica, right side: Secondary | ICD-10-CM | POA: Diagnosis not present

## 2023-12-11 LAB — URINALYSIS, ROUTINE W REFLEX MICROSCOPIC
Bilirubin Urine: NEGATIVE
Glucose, UA: NEGATIVE
Hyaline Cast: NONE SEEN /LPF
Ketones, ur: NEGATIVE
Nitrite: NEGATIVE
Specific Gravity, Urine: 1.02 (ref 1.001–1.035)
pH: 5.5 (ref 5.0–8.0)

## 2023-12-11 LAB — MICROSCOPIC MESSAGE

## 2023-12-11 MED ORDER — PREDNISONE 20 MG PO TABS: ORAL_TABLET | ORAL | 0 refills | Status: AC

## 2023-12-11 MED ORDER — METHYLPREDNISOLONE ACETATE 80 MG/ML IJ SUSP
80.0000 mg | Freq: Once | INTRAMUSCULAR | Status: AC
  Administered 2023-12-11: 80 mg via INTRAMUSCULAR

## 2023-12-11 MED ORDER — METHOCARBAMOL 750 MG PO TABS: 750.0000 mg | ORAL_TABLET | Freq: Four times a day (QID) | ORAL | 1 refills | Status: AC | PRN

## 2023-12-11 NOTE — Progress Notes (Signed)
 Subjective:    Patient ID: Audrey David, female    DOB: Jan 15, 1946, 78 y.o.   MRN: [id number] Patient was experiencing mild left-sided back pain after a recent trip.  However over the last few days it has become severe.  It is now slightly to the left of midline.  The pain is intense.  She is tender to palpation in that area roughly around the level of T12-L3.  It hurts for her to stand.  When she stands however, the pain radiates into her right gluteus and down her right thigh.  She reports paresthesias and hyperesthesias in her right gluteus and right thigh.  She denies any leg weakness.  She denies any bowel or bladder incontinence.  Recently was found to have an elevated creatinine at 1.5 from a baseline of 0.8.  I am assuming this is due to NSAID overuse.  She has stopped her NSAIDs.  Renal ultrasound showed pelvic enlargement of the right kidney but no right-sided hydronephrosis and ultrasound was otherwise normal.  Protein creatinine ratio was normal.  We have decided to push fluids, hold NSAIDs, and recheck a BMP in 2 weeks.  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     Ascorbic Acid (VITAMIN C PO) Take 1 tablet by mouth daily.     Cholecalciferol  (VITAMIN D3) 10 MCG (400 UNIT) CAPS TAKE 2 CAPSULES DAILY 180 capsule 3   loratadine  (CLARITIN ) 10 MG tablet Take 1 tablet (10 mg total) by mouth daily. For itching and swelling. 10  tablet 0   naphazoline-pheniramine (NAPHCON-A) 0.025-0.3 % ophthalmic solution Place 1 drop into both eyes 4 (four) times daily as needed for eye irritation. 15 mL 0   VITAMIN E PO Take 1 tablet by mouth daily.     zolpidem  (AMBIEN ) 10 MG tablet Take 1 tablet (10 mg total) by mouth at bedtime as needed for sleep. 90 tablet 1   ALPRAZolam  (XANAX ) 0.5 MG tablet Take 1 tablet (0.5 mg total) by mouth 3 (three) times daily as needed for anxiety. 30 tablet 0   losartan  (COZAAR ) 50 MG tablet Take 1 tablet (50 mg total) by mouth daily. 90 tablet 1   tiZANidine  (ZANAFLEX ) 4 MG tablet Take 1 tablet (4 mg total) by mouth 2 (two) times daily. 180 tablet 0   Turmeric 500 MG TABS Take 500 mg by mouth 2 (two) times daily.     zinc gluconate 50 MG tablet Take 50 mg by mouth daily.     No current facility-administered medications on file prior to visit.   Allergies  Allergen Reactions   Ace Inhibitors Cough    Other reaction(s): Cough (ALLERGY/intolerance)   Social History   Socioeconomic History   Marital status: Single    Spouse name: Not on file   Number  of children: Not on file   Years of education: Not on file   Highest education level: 12th grade  Occupational History   Not on file  Tobacco Use   Smoking status: Former   Smokeless tobacco: Never   Tobacco comments:      quit smoking cigarettes in 2007   Substance and Sexual Activity   Alcohol use: Yes    Comment: Social    Drug use: No   Sexual activity: Not on file  Other Topics Concern   Not on file  Social History Narrative   Daily caffeine    Social Drivers of Health   Financial Resource Strain: Low Risk  (05/03/2023)   Overall Financial Resource Strain (CARDIA)    Difficulty of Paying Living Expenses: Not very hard  Food Insecurity: No Food Insecurity (05/03/2023)   Hunger Vital Sign    Worried About Running Out of Food in the Last Year: Never true    Ran Out of Food in the Last Year: Never true  Transportation Needs: No  Transportation Needs (05/03/2023)   PRAPARE - Administrator, Civil Service (Medical): No    Lack of Transportation (Non-Medical): No  Physical Activity: Unknown (05/03/2023)   Exercise Vital Sign    Days of Exercise per Week: Patient declined    Minutes of Exercise per Session: Not on file  Stress: No Stress Concern Present (05/03/2023)   [employer] of Occupational Health - Occupational Stress Questionnaire    Feeling of Stress : Only a little  Social Connections: Socially Isolated (05/03/2023)   Social Connection and Isolation Panel    Frequency of Communication with Friends and Family: More than three times a week    Frequency of Social Gatherings with Friends and Family: Three times a week    Attends Religious Services: Never    Active Member of Clubs or Organizations: No    Attends Banker Meetings: Not on file    Marital Status: Divorced  Intimate Partner Violence: Not At Risk (03/07/2022)   Humiliation, Afraid, Rape, and Kick questionnaire    Fear of Current or Ex-Partner: No    Emotionally Abused: No    Physically Abused: No    Sexually Abused: No     Review of Systems  Musculoskeletal:  Positive for back pain.  All other systems reviewed and are negative.      Objective:   Physical Exam Vitals reviewed.  Constitutional:      Appearance: Normal appearance.  Cardiovascular:     Rate and Rhythm: Normal rate and regular rhythm.     Pulses: Normal pulses.     Heart sounds: Normal heart sounds.  Pulmonary:     Effort: Pulmonary effort is normal.     Breath sounds: Normal breath sounds.  Musculoskeletal:     Lumbar back: Spasms, tenderness and bony tenderness present. Decreased range of motion.       Back:  Neurological:     Mental Status: She is alert.           Assessment & Plan:  Acute right-sided low back pain with right-sided sciatica - Plan: Urinalysis, Routine w reflex microscopic, Urine Culture, methylPREDNISolone  acetate  (DEPO-MEDROL ) injection 80 mg Patient's urinalysis shows trace blood and trace leukocyte esterase but certainly not atypical enough to explain the level of pain she is experiencing.  I do not believe that this is a kidney stone.  I can reproduce the pain with palpation in the paraspinal muscles.  Furthermore  she has sciatica-like symptoms.  I believe the most likely she has herniated a disc.  She received Depo-Medrol  80 mg IM x 1 now and will begin a prednisone  taper pack starting tomorrow.  Discontinue tizanidine  and replace with methocarbamol  750 mg every 6 hours for muscle spasms.  Recheck if no better in 1 week or sooner if worsening.  Repeat BMP in 1 week

## 2023-12-13 ENCOUNTER — Other Ambulatory Visit: Payer: Self-pay | Admitting: Family Medicine

## 2023-12-13 ENCOUNTER — Ambulatory Visit: Payer: Self-pay | Admitting: Family Medicine

## 2023-12-13 LAB — URINE CULTURE
MICRO NUMBER:: 17127525
SPECIMEN QUALITY:: ADEQUATE

## 2023-12-13 MED ORDER — CEPHALEXIN 500 MG PO CAPS: 500.0000 mg | ORAL_CAPSULE | Freq: Three times a day (TID) | ORAL | 0 refills | Status: AC

## 2023-12-14 MED ORDER — CIPROFLOXACIN HCL 500 MG PO TABS: 500.0000 mg | ORAL_TABLET | Freq: Two times a day (BID) | ORAL | 0 refills | Status: AC

## 2023-12-17 ENCOUNTER — Other Ambulatory Visit

## 2023-12-17 DIAGNOSIS — N289 Disorder of kidney and ureter, unspecified: Secondary | ICD-10-CM

## 2023-12-18 ENCOUNTER — Ambulatory Visit: Payer: Self-pay | Admitting: Family Medicine

## 2023-12-18 LAB — BASIC METABOLIC PANEL WITH GFR
BUN/Creatinine Ratio: 24 (calc) — ABNORMAL HIGH (ref 6–22)
BUN: 25 mg/dL (ref 7–25)
CO2: 27 mmol/L (ref 20–32)
Calcium: 9.3 mg/dL (ref 8.6–10.4)
Chloride: 103 mmol/L (ref 98–110)
Creat: 1.03 mg/dL — ABNORMAL HIGH (ref 0.60–1.00)
Glucose, Bld: 96 mg/dL (ref 65–99)
Potassium: 4.3 mmol/L (ref 3.5–5.3)
Sodium: 139 mmol/L (ref 135–146)
eGFR: 55 mL/min/1.73m2 — ABNORMAL LOW (ref >=60)

## 2023-12-26 ENCOUNTER — Other Ambulatory Visit: Payer: Self-pay | Admitting: Family Medicine

## 2023-12-26 DIAGNOSIS — I10 Essential (primary) hypertension: Secondary | ICD-10-CM

## 2024-01-30 DIAGNOSIS — R319 Hematuria, unspecified: Secondary | ICD-10-CM | POA: Insufficient documentation

## 2024-02-07 ENCOUNTER — Ambulatory Visit

## 2024-02-07 DIAGNOSIS — Z0000 Encounter for general adult medical examination without abnormal findings: Secondary | ICD-10-CM

## 2024-02-07 NOTE — Progress Notes (Signed)
 Chief Complaint  Patient presents with   Medicare Wellness     Subjective:   Audrey David is a 78 y.o. female who presents for a Medicare Annual Wellness Visit.  No voiced or noted concerns at this time   Visit info / Clinical Intake: Medicare Wellness Visit Type:: Subsequent Annual Wellness Visit Persons participating in visit and providing information:: patient Medicare Wellness Visit Mode:: Telephone If telephone:: video declined Since this visit was completed virtually, some vitals may be partially provided or unavailable. Missing vitals are due to the limitations of the virtual format.: Unable to obtain vitals - no equipment If Telephone or Video please confirm:: I connected with patient using audio/video enable telemedicine. I verified patient identity with two identifiers, discussed telehealth limitations, and patient agreed to proceed. Patient Location:: home Provider Location:: home Pre-visit prep was completed: no AWV questionnaire completed by patient prior to visit?: no Living arrangements:: (!) lives alone Patient's Overall Health Status Rating: good Typical amount of pain: none Does pain affect daily life?: no Are you currently prescribed opioids?: no  Dietary Habits and Nutritional Risks How many meals a day?: 2 Eats fruit and vegetables daily?: yes Most meals are obtained by: preparing own meals In the last 2 weeks, have you had any of the following?: none Diabetic:: no  Functional Status Activities of Daily Living (to include ambulation/medication): Independent Ambulation: Independent Medication Administration: Independent Home Management (perform basic housework or laundry): Independent Manage your own finances?: yes Primary transportation is: driving Concerns about vision?: no *vision screening is required for WTM* Concerns about hearing?: no  Fall Screening Falls in the past year?: 0 Number of falls in past year: 0 Was there an injury with  Fall?: 0 Fall Risk Category Calculator: 0 Patient Fall Risk Level: Low Fall Risk  Fall Risk Patient at Risk for Falls Due to: No Fall Risks Fall risk Follow up: Falls evaluation completed; Education provided; Falls prevention discussed  Home and Transportation Safety: All rugs have non-skid backing?: yes All stairs or steps have railings?: yes Grab bars in the bathtub or shower?: (!) no Have non-skid surface in bathtub or shower?: (!) no Good home lighting?: yes Regular seat belt use?: yes Hospital stays in the last year:: no  Cognitive Assessment Difficulty concentrating, remembering, or making decisions? : no Will 6CIT or Mini Cog be Completed: yes What year is it?: 0 points What month is it?: 0 points Give patient an address phrase to remember (5 components): Its very sunny outside today in December About what time is it?: 0 points Count backwards from 20 to 1: 0 points Say the months of the year in reverse: 0 points Repeat the address phrase from earlier: 0 points 6 CIT Score: 0 points  Advance Directives (For Healthcare) Does Patient Have a Medical Advance Directive?: No Would patient like information on creating a medical advance directive?: No - Patient declined  Reviewed/Updated  Reviewed/Updated: Reviewed All (Medical, Surgical, Family, Medications, Allergies, Care Teams, Patient Goals); Family History; Surgical History; Medications; Allergies; Care Teams; Patient Goals; Medical History    Allergies (verified) Ace inhibitors   Current Medications (verified) Outpatient Encounter Medications as of 02/07/2024  Medication Sig   ALPRAZolam  (XANAX ) 0.5 MG tablet Take 1 tablet (0.5 mg total) by mouth 3 (three) times daily as needed for anxiety.   APPLE CIDER VINEGAR PO Take 1 capsule by mouth daily.   Ascorbic Acid (VITAMIN C PO) Take 1 tablet by mouth daily.   Cholecalciferol  (VITAMIN D3) 10 MCG (  400 UNIT) CAPS TAKE 2 CAPSULES DAILY   losartan  (COZAAR ) 50 MG tablet  TAKE 1 TABLET BY MOUTH EVERY DAY   tiZANidine  (ZANAFLEX ) 4 MG tablet Take 1 tablet (4 mg total) by mouth 2 (two) times daily.   Turmeric 500 MG TABS Take 500 mg by mouth 2 (two) times daily.   VITAMIN E PO Take 1 tablet by mouth daily.   zinc gluconate 50 MG tablet Take 50 mg by mouth daily.   zolpidem  (AMBIEN ) 10 MG tablet Take 1 tablet (10 mg total) by mouth at bedtime as needed for sleep.   cephALEXin  (KEFLEX ) 500 MG capsule Take 1 capsule (500 mg total) by mouth 3 (three) times daily. (Patient not taking: Reported on 02/07/2024)   loratadine  (CLARITIN ) 10 MG tablet Take 1 tablet (10 mg total) by mouth daily. For itching and swelling. (Patient not taking: Reported on 02/07/2024)   methocarbamol  (ROBAXIN ) 750 MG tablet Take 1 tablet (750 mg total) by mouth every 6 (six) hours as needed for muscle spasms. (Patient not taking: Reported on 02/07/2024)   naphazoline-pheniramine (NAPHCON-A) 0.025-0.3 % ophthalmic solution Place 1 drop into both eyes 4 (four) times daily as needed for eye irritation. (Patient not taking: Reported on 02/07/2024)   predniSONE  (DELTASONE ) 20 MG tablet 3 tabs poqday 1-2, 2 tabs poqday 3-4, 1 tab poqday 5-6 (Patient not taking: Reported on 02/07/2024)   No facility-administered encounter medications on file as of 02/07/2024.    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 Throat Cancer.   Breast cancer Niece 28       left [facility] -  opted for bilat mastectomy   Social History   Occupational History   Not on file  Tobacco Use   Smoking status: Former   Smokeless tobacco: Never   Tobacco comments:      quit smoking cigarettes in 2007   Substance and Sexual Activity   Alcohol use: Yes    Comment: Social    Drug use: No   Sexual activity: Not Currently   Tobacco Counseling Counseling given: Not Answered Tobacco comments:   quit smoking cigarettes in 2007   SDOH Screenings   Food Insecurity: No Food Insecurity (02/07/2024)  Housing: Low Risk (02/07/2024)  Transportation Needs: No Transportation Needs (02/07/2024)  Utilities: Not At Risk (02/07/2024)  Alcohol Screen: Low Risk (03/07/2022)  Depression (PHQ2-9): Low Risk (02/07/2024)  Financial Resource Strain: Low Risk (05/03/2023)  Physical Activity: Inactive (02/07/2024)  Social Connections: Moderately Isolated (02/07/2024)  Stress: No Stress Concern Present (02/07/2024)  Tobacco Use: Medium Risk (02/07/2024)  Health Literacy: Adequate Health Literacy (02/07/2024)   See flowsheets for full screening details  Depression Screen PHQ 2 & 9 Depression Scale- Over the past 2 weeks, how often have you been bothered by any of the following  problems? Little interest or pleasure in doing things: 0 Feeling down, depressed, or hopeless (PHQ Adolescent also includes...irritable): 0 PHQ-2 Total Score: 0 Trouble falling or staying asleep, or sleeping too much: 0 Feeling tired or having little energy: 0 Poor appetite or overeating (PHQ Adolescent also includes...weight loss): 0 Feeling bad about yourself - or that you are a failure or have let yourself or your family down: 0 Trouble concentrating on things, such as reading the newspaper or watching television (PHQ Adolescent also includes...like school work): 0 Moving or speaking so slowly that other people could have noticed. Or the opposite - being so fidgety or restless that you have been moving around a lot more  than usual: 0 PHQ-9 Total Score: 0 If you checked off any problems, how difficult have these problems made it for you to do your work, take care of things at home, or get along with other people?: Not difficult at all     Goals Addressed             This Visit's Progress    Patient Stated       Maintain healthy lifestyle             Objective:    There were no vitals filed for this visit. There is no height or weight on file to calculate BMI.  Hearing/Vision screen Hearing Screening - Comments:: No trouble hearing Vision Screening - Comments:: [place]  Up to date Immunizations and Health Maintenance Health Maintenance  Topic Date Due   COVID-19 Vaccine (5 - 2025-26 season) 10/22/2023   Influenza Vaccine  05/20/2024 (Originally 09/21/2023)   Medicare Annual Wellness (AWV)  02/06/2025   Pneumococcal Vaccine: 50+ Years  Completed   Bone Density Scan  Completed   Hepatitis C Screening  Completed   Zoster Vaccines- Shingrix  Completed   Meningococcal B Vaccine  Aged Out   DTaP/Tdap/Td  Discontinued   Mammogram  Discontinued   Colonoscopy  Discontinued        Assessment/Plan:  This is a routine wellness examination for [facility].  Patient Care Team: Duanne Butler DASEN, MD as PCP - General (Family Medicine) [facility], [facility]  I have personally reviewed and noted the following in the patients chart:   Medical and social history Use of alcohol, tobacco or illicit drugs  Current medications and supplements including opioid prescriptions. Functional ability and status Nutritional status Physical activity Advanced directives List of other physicians Hospitalizations, surgeries, and ER visits in previous 12 months Vitals Screenings to include cognitive, depression, and falls Referrals and appointments  No orders of the defined types were placed in this encounter.  In addition, I have reviewed and discussed with patient certain preventive protocols, quality  metrics, and best practice recommendations. A written personalized care plan for preventive services as well as general preventive health recommendations were provided to patient.   Naliya Gish, LPN   [id number]   Return in 1 year (on 02/06/2025).  After Visit Summary: (Mail) Due to this being a telephonic visit, the after visit summary with patients personalized plan was offered to patient via mail   Nurse Notes:

## 2024-02-07 NOTE — Patient Instructions (Signed)
 Audrey David,  Thank you for taking the time for your Medicare Wellness Visit. I appreciate your continued commitment to your health goals. Please review the care plan we discussed, and feel free to reach out if I can assist you further.  Please note that Annual Wellness Visits do not include a physical exam. Some assessments may be limited, especially if the visit was conducted virtually. If needed, we may recommend an in-person follow-up with your provider.  Ongoing Care Seeing your primary care provider every 3 to 6 months helps us  monitor your health and provide consistent, personalized care.   Referrals If a referral was made during today's visit and you haven't received any updates within two weeks, please contact the referred provider directly to check on the status.  Recommended Screenings:  Health Maintenance  Topic Date Due   COVID-19 Vaccine (5 - 2025-26 season) 10/22/2023   Flu Shot  05/20/2024*   Medicare Annual Wellness Visit  02/06/2025   Pneumococcal Vaccine for age over 31  Completed   Osteoporosis screening with Bone Density Scan  Completed   Hepatitis C Screening  Completed   Zoster (Shingles) Vaccine  Completed   Meningitis B Vaccine  Aged Out   DTaP/Tdap/Td vaccine  Discontinued   Breast Cancer Screening  Discontinued   Colon Cancer Screening  Discontinued  *Topic was postponed. The date shown is not the original due date.       02/07/2024   10:24 AM  Advanced Directives  Does Patient Have a Medical Advance Directive? No  Would patient like information on creating a medical advance directive? No - Patient declined    Vision: Annual vision screenings are recommended for early detection of glaucoma, cataracts, and diabetic retinopathy. These exams can also reveal signs of chronic conditions such as diabetes and high blood pressure.  Dental: Annual dental screenings help detect early signs of oral cancer, gum disease, and other conditions linked to overall  health, including heart disease and diabetes.  Please see the attached documents for additional preventive care recommendations.   Audrey David , Thank you for taking time to come for your Medicare Wellness Visit. I appreciate your ongoing commitment to your health goals. Please review the following plan we discussed and let me know if I can assist you in the future.   Screening recommendations/referrals: Colonoscopy:  Mammogram:  Bone Density:  Recommended yearly ophthalmology/optometry visit for glaucoma screening and checkup Recommended yearly dental visit for hygiene and checkup  Vaccinations: Influenza vaccine:  Pneumococcal vaccine:  Tdap vaccine:  Shingles vaccine:       Preventive Care 65 Years and Older, Female Preventive care refers to lifestyle choices and visits with your health care provider that can promote health and wellness. What does preventive care include? A yearly physical exam. This is also called an annual well check. Dental exams once or twice a year. Routine eye exams. Ask your health care provider how often you should have your eyes checked. Personal lifestyle choices, including: Daily care of your teeth and gums. Regular physical activity. Eating a healthy diet. Avoiding tobacco and drug use. Limiting alcohol use. Practicing safe sex. Taking low-dose aspirin every day. Taking vitamin and mineral supplements as recommended by your health care provider. What happens during an annual well check? The services and screenings done by your health care provider during your annual well check will depend on your age, overall health, lifestyle risk factors, and family history of disease. Counseling  Your health care provider may ask you  questions about your: Alcohol use. Tobacco use. Drug use. Emotional well-being. Home and relationship well-being. Sexual activity. Eating habits. History of falls. Memory and ability to understand (cognition). Work and  work astronomer. Reproductive health. Screening  You may have the following tests or measurements: Height, weight, and BMI. Blood pressure. Lipid and cholesterol levels. These may be checked every 5 years, or more frequently if you are over 102 years old. Skin check. Lung cancer screening. You may have this screening every year starting at age 54 if you have a 30-pack-year history of smoking and currently smoke or have quit within the past 15 years. Fecal occult blood test (FOBT) of the stool. You may have this test every year starting at age 89. Flexible sigmoidoscopy or colonoscopy. You may have a sigmoidoscopy every 5 years or a colonoscopy every 10 years starting at age 32. Hepatitis C blood test. Hepatitis B blood test. Sexually transmitted disease (STD) testing. Diabetes screening. This is done by checking your blood sugar (glucose) after you have not eaten for a while (fasting). You may have this done every 1-3 years. Bone density scan. This is done to screen for osteoporosis. You may have this done starting at age 42. Mammogram. This may be done every 1-2 years. Talk to your health care provider about how often you should have regular mammograms. Talk with your health care provider about your test results, treatment options, and if necessary, the need for more tests. Vaccines  Your health care provider may recommend certain vaccines, such as: Influenza vaccine. This is recommended every year. Tetanus, diphtheria, and acellular pertussis (Tdap, Td) vaccine. You may need a Td booster every 10 years. Zoster vaccine. You may need this after age 43. Pneumococcal 13-valent conjugate (PCV13) vaccine. One dose is recommended after age 90. Pneumococcal polysaccharide (PPSV23) vaccine. One dose is recommended after age 40. Talk to your health care provider about which screenings and vaccines you need and how often you need them. This information is not intended to replace advice given to you  by your health care provider. Make sure you discuss any questions you have with your health care provider. Document Released: 03/05/2015 Document Revised: 10/27/2015 Document Reviewed: 12/08/2014 Elsevier Interactive Patient Education  2017 [employer].  Fall Prevention in the Home Falls can cause injuries. They can happen to people of all ages. There are many things you can do to make your home safe and to help prevent falls. What can I do on the outside of my home? Regularly fix the edges of walkways and driveways and fix any cracks. Remove anything that might make you trip as you walk through a door, such as a raised step or threshold. Trim any bushes or trees on the path to your home. Use bright outdoor lighting. Clear any walking paths of anything that might make someone trip, such as rocks or tools. Regularly check to see if handrails are loose or broken. Make sure that both sides of any steps have handrails. Any raised decks and porches should have guardrails on the edges. Have any leaves, snow, or ice cleared regularly. Use sand or salt on walking paths during winter. Clean up any spills in your garage right away. This includes oil or grease spills. What can I do in the bathroom? Use night lights. Install grab bars by the toilet and in the tub and shower. Do not use towel bars as grab bars. Use non-skid mats or decals in the tub or shower. If you need to sit  down in the shower, use a plastic, non-slip stool. Keep the floor dry. Clean up any water that spills on the floor as soon as it happens. Remove soap buildup in the tub or shower regularly. Attach bath mats securely with double-sided non-slip rug tape. Do not have throw rugs and other things on the floor that can make you trip. What can I do in the bedroom? Use night lights. Make sure that you have a light by your bed that is easy to reach. Do not use any sheets or blankets that are too big for your bed. They should not  hang down onto the floor. Have a firm chair that has side arms. You can use this for support while you get dressed. Do not have throw rugs and other things on the floor that can make you trip. What can I do in the kitchen? Clean up any spills right away. Avoid walking on wet floors. Keep items that you use a lot in easy-to-reach places. If you need to reach something above you, use a strong step stool that has a grab bar. Keep electrical cords out of the way. Do not use floor polish or wax that makes floors slippery. If you must use wax, use non-skid floor wax. Do not have throw rugs and other things on the floor that can make you trip. What can I do with my stairs? Do not leave any items on the stairs. Make sure that there are handrails on both sides of the stairs and use them. Fix handrails that are broken or loose. Make sure that handrails are as long as the stairways. Check any carpeting to make sure that it is firmly attached to the stairs. Fix any carpet that is loose or worn. Avoid having throw rugs at the top or bottom of the stairs. If you do have throw rugs, attach them to the floor with carpet tape. Make sure that you have a light switch at the top of the stairs and the bottom of the stairs. If you do not have them, ask someone to add them for you. What else can I do to help prevent falls? Wear shoes that: Do not have high heels. Have rubber bottoms. Are comfortable and fit you well. Are closed at the toe. Do not wear sandals. If you use a stepladder: Make sure that it is fully opened. Do not climb a closed stepladder. Make sure that both sides of the stepladder are locked into place. Ask someone to hold it for you, if possible. Clearly mark and make sure that you can see: Any grab bars or handrails. First and last steps. Where the edge of each step is. Use tools that help you move around (mobility aids) if they are needed. These  include: Canes. Walkers. Scooters. Crutches. Turn on the lights when you go into a dark area. Replace any light bulbs as soon as they burn out. Set up your furniture so you have a clear path. Avoid moving your furniture around. If any of your floors are uneven, fix them. If there are any pets around you, be aware of where they are. Review your medicines with your doctor. Some medicines can make you feel dizzy. This can increase your chance of falling. Ask your doctor what other things that you can do to help prevent falls. This information is not intended to replace advice given to you by your health care provider. Make sure you discuss any questions you have with your health care provider.  Document Released: 12/03/2008 Document Revised: 07/15/2015 Document Reviewed: 03/13/2014 Elsevier Interactive Patient Education  2017 [employer].

## 2024-03-20 ENCOUNTER — Ambulatory Visit: Payer: Medicare Other
